# Patient Record
Sex: Female | Born: 1965
Health system: Southern US, Community
[De-identification: ages and names within clinical notes are randomized; demographics above are authoritative.]

## PROBLEM LIST (undated history)

## (undated) DIAGNOSIS — M1990 Unspecified osteoarthritis, unspecified site: Secondary | ICD-10-CM

## (undated) DIAGNOSIS — E785 Hyperlipidemia, unspecified: Secondary | ICD-10-CM

## (undated) DIAGNOSIS — M48061 Spinal stenosis, lumbar region without neurogenic claudication: Secondary | ICD-10-CM

## (undated) DIAGNOSIS — F419 Anxiety disorder, unspecified: Secondary | ICD-10-CM

## (undated) DIAGNOSIS — F32A Depression, unspecified: Secondary | ICD-10-CM

## (undated) DIAGNOSIS — F329 Major depressive disorder, single episode, unspecified: Secondary | ICD-10-CM

## (undated) DIAGNOSIS — G4730 Sleep apnea, unspecified: Secondary | ICD-10-CM

## (undated) HISTORY — DX: Anxiety disorder, unspecified: F41.9

## (undated) HISTORY — PX: BREAST BIOPSY: SHX20

## (undated) HISTORY — PX: WRIST SURGERY: SHX841

## (undated) HISTORY — PX: BACK SURGERY: SHX140

## (undated) HISTORY — DX: Depression, unspecified: F32.A

## (undated) HISTORY — DX: Major depressive disorder, single episode, unspecified: F32.9

## (undated) HISTORY — DX: Hyperlipidemia, unspecified: E78.5

---

## 1988-12-16 HISTORY — PX: BREAST BIOPSY: SHX20

## 1994-04-17 HISTORY — PX: LAMINECTOMY: SHX219

## 1998-04-17 HISTORY — PX: CHOLECYSTECTOMY: SHX55

## 2002-10-01 ENCOUNTER — Encounter: Admission: RE | Admit: 2002-10-01 | Discharge: 2002-10-01 | Payer: Self-pay | Admitting: Orthopedic Surgery

## 2002-10-01 ENCOUNTER — Encounter: Payer: Self-pay | Admitting: Radiology

## 2002-10-01 ENCOUNTER — Encounter: Payer: Self-pay | Admitting: Orthopedic Surgery

## 2002-10-15 ENCOUNTER — Encounter: Admission: RE | Admit: 2002-10-15 | Discharge: 2002-10-15 | Payer: Self-pay | Admitting: Orthopedic Surgery

## 2002-10-15 ENCOUNTER — Encounter: Payer: Self-pay | Admitting: Orthopedic Surgery

## 2003-01-07 ENCOUNTER — Encounter: Admission: RE | Admit: 2003-01-07 | Discharge: 2003-01-07 | Payer: Self-pay | Admitting: Orthopedic Surgery

## 2003-01-07 ENCOUNTER — Encounter: Payer: Self-pay | Admitting: Orthopedic Surgery

## 2005-06-23 DIAGNOSIS — Z720 Tobacco use: Secondary | ICD-10-CM | POA: Insufficient documentation

## 2006-11-27 DIAGNOSIS — N926 Irregular menstruation, unspecified: Secondary | ICD-10-CM | POA: Insufficient documentation

## 2012-01-17 HISTORY — PX: ANKLE SURGERY: SHX546

## 2012-01-18 DIAGNOSIS — E669 Obesity, unspecified: Secondary | ICD-10-CM | POA: Insufficient documentation

## 2012-10-04 ENCOUNTER — Ambulatory Visit: Payer: Self-pay | Admitting: Urology

## 2012-10-14 ENCOUNTER — Ambulatory Visit: Payer: Self-pay | Admitting: Family Medicine

## 2012-10-14 LAB — HM HEPATITIS C SCREENING LAB: HM Hepatitis Screen: NEGATIVE

## 2012-10-29 ENCOUNTER — Ambulatory Visit: Payer: Self-pay | Admitting: Urology

## 2012-11-04 ENCOUNTER — Ambulatory Visit: Payer: Self-pay | Admitting: Urology

## 2013-10-14 LAB — LIPID PANEL
Cholesterol: 172 mg/dL (ref 0–200)
HDL: 46 mg/dL (ref 35–70)
LDL CALC: 99 mg/dL
Triglycerides: 134 mg/dL (ref 40–160)

## 2013-10-14 LAB — CBC AND DIFFERENTIAL
HEMATOCRIT: 46 % (ref 36–46)
Hemoglobin: 15.8 g/dL (ref 12.0–16.0)
Neutrophils Absolute: 67 /uL
Platelets: 296 10*3/uL (ref 150–399)
WBC: 9.6 10*3/mL

## 2013-10-14 LAB — HEMOGLOBIN A1C: Hemoglobin A1C: 5.6

## 2013-10-14 LAB — HEPATIC FUNCTION PANEL
ALT: 7 U/L (ref 7–35)
AST: 11 U/L — AB (ref 13–35)
Alkaline Phosphatase: 103 U/L (ref 25–125)
Bilirubin, Total: 0.3 mg/dL

## 2013-10-14 LAB — BASIC METABOLIC PANEL
BUN: 9 mg/dL (ref 4–21)
Creatinine: 0.6 mg/dL (ref 0.5–1.1)
GLUCOSE: 115 mg/dL
POTASSIUM: 5.4 mmol/L — AB (ref 3.4–5.3)
Sodium: 141 mmol/L (ref 137–147)

## 2014-01-20 DIAGNOSIS — R053 Chronic cough: Secondary | ICD-10-CM | POA: Insufficient documentation

## 2014-01-20 DIAGNOSIS — R05 Cough: Secondary | ICD-10-CM | POA: Insufficient documentation

## 2014-04-15 ENCOUNTER — Emergency Department: Payer: Self-pay | Admitting: Emergency Medicine

## 2014-04-16 ENCOUNTER — Ambulatory Visit: Payer: Self-pay | Admitting: General Practice

## 2014-04-20 ENCOUNTER — Ambulatory Visit: Payer: Self-pay | Admitting: General Practice

## 2014-08-07 NOTE — Op Note (Signed)
PATIENT NAME:  Emily Bender, Emily Bender MR#:  [id number] DATE OF BIRTH:  1966-03-15  DATE OF PROCEDURE:  11/04/2012  PREOPERATIVE DIAGNOSIS: Interstitial cystitis.  POSTOPERATIVE DIAGNOSIS:  Interstitial cystitis.   PROCEDURE:  Cysto hydro-dilatation of the bladder for interstitial cystitis.   FINDINGS: Left lateral ureter, seated position. Normal right ureter. With the patient sterilely prepped and draped in supine lithotomy position for ease of approach to the external genitalia, the procedure was begun. The patient has good relaxation from the general anesthetic. Cystoscopy was done with a 21 French sheath and a 4-oblique lens. The bladder was examined. Left lateral ureter seen in the seated position with horseshoe-like orifice. The right ureter is normal in position with a stadium-like orifice. The bladder shows some trabeculation, about grade I. I think it looks more like a neurogenic bladder rather than an interstitial cystitis bladder. The hydro-dilatation is done. There is no terminal hematuria, petechial hemorrhage to  glomerulation seen. I think the patient has more of an overactive neurogenic type bladder. No evidence of infection, tumor mass or growth is seen. Urine is coming from each ureteral orifice. At the end of the procedure, the bladder is emptied. The capacities were 600, 650 to 700 and  500 mL of water solution. After the bladder is emptied, 30 mL of Marcaine placed in, she was sent to recovery in satisfactory condition with B and O suppository. Bimanual exam is negative.   ____________________________ Janice Coffin. Elnoria Howard, [place] rdh:cc D: 11/04/2012 14:04:44 ET T: 11/04/2012 17:53:02 ET JOB#: [id number]  cc: Janice Coffin. Elnoria Howard, DO, <Dictator> RICHARD D HART DO ELECTRONICALLY SIGNED 11/08/2012 16:39

## 2014-08-16 NOTE — Op Note (Signed)
PATIENT NAME:  Emily Bender, Emily Bender MR#:  [id number] DATE OF BIRTH:  1965/05/23  DATE OF PROCEDURE:  04/20/2014  PREOPERATIVE DIAGNOSIS: Displaced comminuted extra-articular right distal radius fracture.   POSTOPERATIVE DIAGNOSIS: Displaced comminuted extra-articular right distal radius fracture.   PROCEDURE PERFORMED: Open reduction and internal fixation of right distal radius fracture.   SURGEON: Skip Estimable, MD   ANESTHESIA: General.   ESTIMATED BLOOD LOSS: Minimal.   FLUIDS REPLACED: 1100 mL of crystalloid.   TOURNIQUET TIME: 75 minutes.   IMPLANTS UTILIZED: [employer] DVRA-S volar plate, seven 2.5 mm fully threaded pegs, and three 3.5 mm cortical screws.   INDICATIONS FOR SURGERY: The patient is a 49 year old female who fell on her outstretched right hand and sustained comminuted displaced extra-articular distal radius fracture. Risks and benefits of surgical intervention were discussed with the patient. She expressed understanding of the risks and benefits and agreed with plans for surgical intervention.   PROCEDURE IN DETAIL: The patient was brought to the operating room and, after adequate general anesthesia was achieved, a tourniquet was placed on the patient's upper right arm. The patient's right hand and arm were cleaned and prepped with alcohol and DuraPrep and draped in the usual sterile fashion. A "timeout" was performed as per usual protocol. The right upper extremity was exsanguinated using an Esmarch, and the tourniquet was inflated to 250 mmHg. Loupe magnification was used throughout the procedure. A longitudinal incision was made on the volar aspect of the wrist and forearm in line with the flexor carpi radialis tendon. Dissection was carried down and the tendon sheath was incised and the tendon was reflected in an ulnar direction. Floor of the tendon sheath was subsequently incised and dissection was carried down so as to develop a plane extending down to the pronator  quadratus. The pronator was incised in a L-shaped incision and elevated off of the volar surface of the radius. The fracture site was visualized and soft tissue debris and hematoma was evacuated from the site. The fracture was reduced with good position noted in both AP and lateral planes using the FluoroScan. A DVRA-S volar plate was then provisionally placed on the volar surface of the radius and provisional fixation maintained with K wires. Again, good reduction and position of the hardware was appreciated. A 3.5 mm cortical screw was inserted through the slotted space of the plate. Next, four 2.5 mm fully threaded pegs were inserted through the proximal row. Good position was noted. The 3 distal positions were then utilized for placement of an additional three 2.5 mm fully threaded pegs. Good position and reduction was appreciated in both AP and lateral planes. The proximal portion of the plate was then further secured using 2 additional 3.5 mm cortical screws. Again, good reduction and position was noted in both AP, lateral, and PA planes using the FluoroScan. The wound was irrigated with copious amounts of normal saline with antibiotic solution. The pronator quadratus was placed over the plate and repaired using #0 Vicryl. The tourniquet was deflated after total tourniquet time of 75 minutes. Good hemostasis was appreciated. The wound was then closed using #2-0 Vicryl. A running subcuticular suture of #4-0 Vicryl was used for closure of the skin. Steri-Strips were applied. Then, 10 mL of 0.25% Marcaine was injected along the incision site. A sterile dressing was applied followed by application of a volar wrist splint.   The patient tolerated the procedure well. She was transported to the recovery room in stable condition.  ____________________________ Laurice Record. [place]  Brooke Bonito., MD jph:sb D: 04/21/2014 06:31:12 ET T: 04/21/2014 08:13:35 ET JOB#: [id number]  cc: Laurice Record. Holley Bouche., MD, <Dictator> JAMES P  Holley Bouche MD ELECTRONICALLY SIGNED 05/04/2014 0:49

## 2014-10-14 ENCOUNTER — Other Ambulatory Visit: Payer: Self-pay | Admitting: Orthopedic Surgery

## 2014-10-14 DIAGNOSIS — M25562 Pain in left knee: Secondary | ICD-10-CM

## 2014-10-15 ENCOUNTER — Ambulatory Visit: Admission: RE | Admit: 2014-10-15 | Payer: BLUE CROSS/BLUE SHIELD | Source: Ambulatory Visit

## 2014-10-21 ENCOUNTER — Ambulatory Visit: Payer: Self-pay

## 2015-02-04 ENCOUNTER — Other Ambulatory Visit: Payer: Self-pay | Admitting: Family Medicine

## 2015-04-08 ENCOUNTER — Other Ambulatory Visit: Payer: Self-pay | Admitting: Family Medicine

## 2015-04-08 DIAGNOSIS — G629 Polyneuropathy, unspecified: Secondary | ICD-10-CM

## 2015-04-08 NOTE — Telephone Encounter (Signed)
Emily Bender.  

## 2015-05-05 ENCOUNTER — Other Ambulatory Visit: Payer: Self-pay | Admitting: Physician Assistant

## 2015-05-20 ENCOUNTER — Ambulatory Visit (INDEPENDENT_AMBULATORY_CARE_PROVIDER_SITE_OTHER): Payer: BLUE CROSS/BLUE SHIELD | Admitting: Family Medicine

## 2015-05-20 ENCOUNTER — Encounter: Payer: Self-pay | Admitting: Family Medicine

## 2015-05-20 VITALS — BP 142/82 | HR 94 | Temp 98.2°F | Resp 18 | Wt 244.2 lb

## 2015-05-20 DIAGNOSIS — J069 Acute upper respiratory infection, unspecified: Secondary | ICD-10-CM | POA: Diagnosis not present

## 2015-05-20 DIAGNOSIS — Z7289 Other problems related to lifestyle: Secondary | ICD-10-CM | POA: Insufficient documentation

## 2015-05-20 DIAGNOSIS — H109 Unspecified conjunctivitis: Secondary | ICD-10-CM

## 2015-05-20 DIAGNOSIS — F32A Depression, unspecified: Secondary | ICD-10-CM | POA: Insufficient documentation

## 2015-05-20 DIAGNOSIS — E559 Vitamin D deficiency, unspecified: Secondary | ICD-10-CM | POA: Insufficient documentation

## 2015-05-20 DIAGNOSIS — F329 Major depressive disorder, single episode, unspecified: Secondary | ICD-10-CM | POA: Insufficient documentation

## 2015-05-20 DIAGNOSIS — E785 Hyperlipidemia, unspecified: Secondary | ICD-10-CM | POA: Insufficient documentation

## 2015-05-20 DIAGNOSIS — F419 Anxiety disorder, unspecified: Secondary | ICD-10-CM | POA: Insufficient documentation

## 2015-05-20 DIAGNOSIS — Z9660 Presence of unspecified orthopedic joint implant: Secondary | ICD-10-CM | POA: Insufficient documentation

## 2015-05-20 DIAGNOSIS — Z789 Other specified health status: Secondary | ICD-10-CM | POA: Insufficient documentation

## 2015-05-20 MED ORDER — AZITHROMYCIN 250 MG PO TABS: ORAL_TABLET | ORAL | Status: DC

## 2015-05-20 MED ORDER — PROMETHAZINE-DM 6.25-15 MG/5ML PO SYRP: 5.0000 mL | ORAL_SOLUTION | Freq: Four times a day (QID) | ORAL | Status: DC | PRN

## 2015-05-20 MED ORDER — ERYTHROMYCIN 5 MG/GM OP OINT: 1.0000 "application " | TOPICAL_OINTMENT | Freq: Three times a day (TID) | OPHTHALMIC | Status: DC

## 2015-05-20 MED ORDER — ALBUTEROL SULFATE HFA 108 (90 BASE) MCG/ACT IN AERS: 2.0000 | INHALATION_SPRAY | Freq: Four times a day (QID) | RESPIRATORY_TRACT | Status: DC | PRN

## 2015-05-20 NOTE — Progress Notes (Signed)
Patient ID: Emily Bender, female   DOB: 06-Jun-1965, 50 y.o.   MRN: [id number]   Patient: Emily Bender Female    DOB: Feb 24, 1966   50 y.o.   MRN: [id number] Visit Date: 05/20/2015  Today's Provider: Vernie Murders, PA   Chief Complaint  Patient presents with  . URI   Subjective:    URI  This is a new problem. The current episode started in the past 7 days. The problem has been gradually worsening. Associated symptoms include congestion, coughing, ear pain and a sore throat. Treatments tried: OTC Alka-seltzer. The treatment provided mild relief.   Patient Active Problem List   Diagnosis Date Noted  . Alcohol drinker ([place]) 05/20/2015  . Anxiety and depression 05/20/2015  . History of artificial joint 05/20/2015  . HLD (hyperlipidemia) 05/20/2015  . Avitaminosis D 05/20/2015  . Chronic cough 01/20/2014  . Adiposity 01/18/2012  . Irregular bleeding 11/27/2006  . Tobacco use 06/23/2005   Past Surgical History  Procedure Laterality Date  . Cholecystectomy  2000  . Laminectomy  1996  . Ankle surgery  01/17/2012  . Breast biopsy Left 1990's   Family History  Problem Relation Age of Onset  . Diabetes Mother   . Hypertension Mother   . Lung cancer Father   . Healthy Brother   . Aneurysm Maternal Grandmother   . Lung cancer Maternal Grandfather   . Coronary artery disease Paternal Grandmother      Previous Medications   ALBUTEROL (PROVENTIL HFA) 108 (90 BASE) MCG/ACT INHALER    Inhale into the lungs. Reported on 05/20/2015   CALCIUM-MAGNESIUM-VITAMIN D (CALCIUM MAGNESIUM PO)    Take by mouth. Reported on 05/20/2015   CHOLECALCIFEROL (VITAMIN D) 1000 UNITS TABLET    Take by mouth. Reported on 05/20/2015   CYANOCOBALAMIN 100 MCG TABLET    Take by mouth. Reported on 05/20/2015   DIAZEPAM (VALIUM) 5 MG TABLET    Take by mouth.   FLUTICASONE (FLONASE) 50 MCG/ACT NASAL SPRAY    Place into the nose. Reported on 05/20/2015   GABAPENTIN (NEURONTIN) 100 MG CAPSULE    TAKE 1 CAPSULE BY MOUTH  TWICE DAILY   LORATADINE (CLARITIN) 10 MG TABLET    Take by mouth. Reported on 05/20/2015   MELOXICAM (MOBIC) 7.5 MG TABLET       MONTELUKAST (SINGULAIR) 10 MG TABLET    Take by mouth. Reported on 05/20/2015   SERTRALINE (ZOLOFT) 50 MG TABLET    TAKE ONE TABLET BY MOUTH AT BEDTIME   Allergies  Allergen Reactions  . Sulfa Antibiotics     Review of Systems  Constitutional: Negative.   HENT: Positive for congestion, ear pain and sore throat.   Eyes: Negative.   Respiratory: Positive for cough.   Cardiovascular: Negative.   Gastrointestinal: Negative.   Endocrine: Negative.   Genitourinary: Negative.   Musculoskeletal: Negative.   Skin: Negative.   Allergic/Immunologic: Negative.   Neurological: Negative.   Hematological: Negative.   Psychiatric/Behavioral: Negative.     Social History  Substance Use Topics  . Smoking status: Current Every Day Smoker -- 1.00 packs/day for 20 years    Types: Cigarettes  . Smokeless tobacco: Never Used  . Alcohol Use: 0.0 oz/week    0 Standard drinks or equivalent per week     Comment: moderate use- drinks 4-6 beers a day   Objective:   BP 142/82 mmHg  Pulse 94  Temp(Src) 98.2 F (36.8 C) (Oral)  Resp 18  Wt 244 lb 3.2 oz (  110.768 kg)  SpO2 94%  Physical Exam  Constitutional: She is oriented to person, place, and time. She appears well-developed and well-nourished.  HENT:  Head: Normocephalic.  Questionable milky fluid behind left TM. Turbinates slightly swollen and red. Slightly irritated and cobblestone appearing posterior pharynx.  Eyes: EOM are normal.  Slightly hyperemic left conjuntiva  Neck: Neck supple.  Cardiovascular: Normal rate and regular rhythm.   Pulmonary/Chest: Effort normal and breath sounds normal. She has no rales.  Abdominal: Soft. Bowel sounds are normal.  Lymphadenopathy:    She has cervical adenopathy.  Neurological: She is alert and oriented to person, place, and time.  Skin: No rash noted.  Psychiatric: She  has a normal mood and affect. Her behavior is normal.      Assessment & Plan:     1. Upper respiratory infection Onset over the past 2-3 days with purulent sputum with cough at night. Some chest tightness at times. Not much help with Alka-Seltzer Cold. Still smoking 1/2-1 ppd before this started (unable to smoke now without worsening cough). Will refill Albuterol MDK and add cough syrup with antibiotic. Increase fluid intake and may use Tylenol or Advil prn. Recheck prn. - albuterol (PROAIR HFA) 108 (90 Base) MCG/ACT inhaler; Inhale 2 puffs into the lungs every 6 (six) hours as needed for wheezing or shortness of breath. Reported on 05/20/2015  Dispense: 1 Inhaler; Refill: 3 - azithromycin (ZITHROMAX) 250 MG tablet; Take two tablets today by mouth then one daily for 4 days.  Dispense: 6 tablet; Refill: 0 - promethazine-dextromethorphan (PROMETHAZINE-DM) 6.25-15 MG/5ML syrup; Take 5 mLs by mouth 4 (four) times daily as needed for cough.  Dispense: 118 mL; Refill: 0  2. Conjunctivitis of left eye Noticed crusting and milky mucus from the left eye this morning. Red conjunctiva. Will treat with antibiotic ointment and warm compresses. Recheck if no better in 3-4 days. - erythromycin ophthalmic ointment; Place 1 application into the left eye 3 (three) times daily. Use 1/2" ribbon of ointment inside of the lower eyelid.  Dispense: 3.5 g; Refill: 0

## 2015-06-09 ENCOUNTER — Other Ambulatory Visit: Payer: Self-pay | Admitting: Physician Assistant

## 2015-06-15 ENCOUNTER — Other Ambulatory Visit: Payer: Self-pay | Admitting: Family Medicine

## 2015-10-11 DIAGNOSIS — Z96661 Presence of right artificial ankle joint: Secondary | ICD-10-CM | POA: Diagnosis not present

## 2015-10-11 DIAGNOSIS — Z471 Aftercare following joint replacement surgery: Secondary | ICD-10-CM | POA: Diagnosis not present

## 2015-10-14 ENCOUNTER — Other Ambulatory Visit: Payer: Self-pay | Admitting: Family Medicine

## 2016-02-21 DIAGNOSIS — L728 Other follicular cysts of the skin and subcutaneous tissue: Secondary | ICD-10-CM | POA: Diagnosis not present

## 2016-02-28 ENCOUNTER — Encounter: Payer: Self-pay | Admitting: Family Medicine

## 2016-02-28 ENCOUNTER — Ambulatory Visit (INDEPENDENT_AMBULATORY_CARE_PROVIDER_SITE_OTHER): Payer: BLUE CROSS/BLUE SHIELD | Admitting: Family Medicine

## 2016-02-28 VITALS — BP 148/98 | HR 91 | Temp 98.2°F | Resp 16 | Ht 67.0 in | Wt 231.4 lb

## 2016-02-28 DIAGNOSIS — E782 Mixed hyperlipidemia: Secondary | ICD-10-CM | POA: Diagnosis not present

## 2016-02-28 DIAGNOSIS — F329 Major depressive disorder, single episode, unspecified: Secondary | ICD-10-CM

## 2016-02-28 DIAGNOSIS — F418 Other specified anxiety disorders: Secondary | ICD-10-CM | POA: Diagnosis not present

## 2016-02-28 DIAGNOSIS — R030 Elevated blood-pressure reading, without diagnosis of hypertension: Secondary | ICD-10-CM | POA: Diagnosis not present

## 2016-02-28 DIAGNOSIS — Z0000 Encounter for general adult medical examination without abnormal findings: Secondary | ICD-10-CM | POA: Diagnosis not present

## 2016-02-28 DIAGNOSIS — E559 Vitamin D deficiency, unspecified: Secondary | ICD-10-CM

## 2016-02-28 DIAGNOSIS — E6609 Other obesity due to excess calories: Secondary | ICD-10-CM | POA: Diagnosis not present

## 2016-02-28 DIAGNOSIS — Z9660 Presence of unspecified orthopedic joint implant: Secondary | ICD-10-CM | POA: Diagnosis not present

## 2016-02-28 DIAGNOSIS — Z6836 Body mass index (BMI) 36.0-36.9, adult: Secondary | ICD-10-CM

## 2016-02-28 DIAGNOSIS — L728 Other follicular cysts of the skin and subcutaneous tissue: Secondary | ICD-10-CM | POA: Diagnosis not present

## 2016-02-28 DIAGNOSIS — F32A Depression, unspecified: Secondary | ICD-10-CM

## 2016-02-28 DIAGNOSIS — Z124 Encounter for screening for malignant neoplasm of cervix: Secondary | ICD-10-CM

## 2016-02-28 DIAGNOSIS — F419 Anxiety disorder, unspecified: Secondary | ICD-10-CM

## 2016-02-28 NOTE — Progress Notes (Signed)
Patient: Emily Bender, Female    DOB: 1965/10/04, 50 y.o.   MRN: [id number] Visit Date: 02/28/2016  Today's Provider: Vernie Murders, PA   Chief Complaint  Patient presents with  . Annual Exam   Subjective:    Annual physical exam Emily Bender is a 50 y.o. female who presents today for health maintenance and complete physical. She feels fairly well. She reports exercising a little bit. She reports she is sleeping poorly.  ----------------------------------------------------------------- Mammo: 10/14/2012 Pap: due today  Review of Systems  Constitutional: Negative.   HENT: Negative.   Eyes: Negative.   Respiratory: Negative.   Cardiovascular: Negative.   Gastrointestinal: Negative.   Endocrine: Negative.   Genitourinary: Negative.   Musculoskeletal: Positive for arthralgias, back pain and joint swelling.  Skin: Negative.   Allergic/Immunologic: Negative.   Neurological: Negative.   Hematological: Negative.   Psychiatric/Behavioral: Negative.     Social History      She  reports that she has been smoking Cigarettes.  She has a 20.00 pack-year smoking history. She has never used smokeless tobacco. She reports that she drinks alcohol. She reports that she uses drugs.       Social History   Social History  . Marital status: Single    Spouse name: N/A  . Number of children: N/A  . Years of education: N/A   Social History Main Topics  . Smoking status: Current Every Day Smoker    Packs/day: 1.00    Years: 20.00    Types: Cigarettes  . Smokeless tobacco: Never Used  . Alcohol use 0.0 oz/week     Comment: moderate use- drinks 4-6 beers a day  . Drug use:      Comment: uses recreational drugs  . Sexual activity: Not Asked   Other Topics Concern  . None   Social History Narrative  . None    No past medical history on file.   Patient Active Problem List   Diagnosis Date Noted  . Alcohol drinker 05/20/2015  . Anxiety and depression 05/20/2015    . History of artificial joint 05/20/2015  . HLD (hyperlipidemia) 05/20/2015  . Avitaminosis D 05/20/2015  . Chronic cough 01/20/2014  . Adiposity 01/18/2012  . Irregular bleeding 11/27/2006  . Tobacco use 06/23/2005    Past Surgical History:  Procedure Laterality Date  . ANKLE SURGERY  01/17/2012  . BREAST BIOPSY Left 1990's  . CHOLECYSTECTOMY  2000  . LAMINECTOMY  1996    Family History        Family Status  Relation Status  . Father Deceased at age 9  . Brother Alive  . Maternal Grandmother Deceased  . Maternal Grandfather Deceased  . Paternal Grandmother Deceased        Her family history includes Aneurysm in her maternal grandmother; Coronary artery disease in her paternal grandmother; Diabetes in her mother; Healthy in her brother; Hypertension in her mother; Lung cancer in her father and maternal grandfather.     Allergies  Allergen Reactions  . Sulfa Antibiotics Hives     Current Outpatient Prescriptions:  .  diazepam (VALIUM) 5 MG tablet, Take by mouth., Disp: , Rfl:  .  gabapentin (NEURONTIN) 100 MG capsule, TAKE 1 CAPSULE TWICE DAILY, Disp: 60 capsule, Rfl: 0 .  meloxicam (MOBIC) 7.5 MG tablet, , Disp: , Rfl: 0 .  sertraline (ZOLOFT) 50 MG tablet, TAKE ONE TABLET AT BEDTIME, Disp: 30 tablet, Rfl: 3   Patient Care Team: Vickki Muff  Jerrod Damiano, PA as PCP - General (Physician Assistant)      Objective:   Vitals: BP (!) 148/98 (BP Location: Right Arm, Patient Position: Sitting, Cuff Size: Large)   Pulse 91   Temp 98.2 F (36.8 C) (Oral)   Resp 16   Ht 5\' 7"  (1.702 m)   Wt 231 lb 6.4 oz (105 kg)   SpO2 95%   BMI 36.24 kg/m   Wt Readings from Last 3 Encounters:  02/28/16 231 lb 6.4 oz (105 kg)  05/20/15 244 lb 3.2 oz (110.8 kg)    Physical Exam  Constitutional: She is oriented to person, place, and time. She appears well-developed and well-nourished.  HENT:  Head: Normocephalic and atraumatic.  Right Ear: External ear normal.  Left Ear: External  ear normal.  Nose: Nose normal.  Mouth/Throat: Oropharynx is clear and moist.  Eyes: Conjunctivae and EOM are normal. Pupils are equal, round, and reactive to light. Right eye exhibits no discharge.  Neck: Normal range of motion. Neck supple. No tracheal deviation present. No thyromegaly present.  Cardiovascular: Normal rate, regular rhythm, normal heart sounds and intact distal pulses.   No murmur heard. Pulmonary/Chest: Effort normal and breath sounds normal. No respiratory distress. She has no wheezes. She has no rales. She exhibits no tenderness.  Abdominal: Soft. Bowel sounds are normal. She exhibits no distension and no mass. There is no tenderness. There is no rebound and no guarding.  Genitourinary: Vagina normal and uterus normal. Rectal exam shows guaiac negative stool.  Musculoskeletal: She exhibits no edema or tenderness.  Slight crepitus both knees. Right ankle larger than the left with history of total ankle arthroplasty in 2013 and fracture off the top of the right ankle navicular in June 2017. Good pulses bilaterally. Stiffness of the right ankle. Well healed scar from lumbar laminectomy in 1992 and 1996. Good spinal ROM without numbness in legs.  Lymphadenopathy:    She has no cervical adenopathy.  Neurological: She is alert and oriented to person, place, and time. She has normal reflexes. No cranial nerve deficit. She exhibits normal muscle tone. Coordination normal.  Skin: Skin is warm and dry. No rash noted. No erythema.  Psychiatric: Her speech is normal and behavior is normal. Judgment and thought content normal. Her affect is blunt.   Depression Screen PHQ 2/9 Scores 02/28/2016  PHQ - 2 Score 1  PHQ- 9 Score 4   Assessment & Plan:     Routine Health Maintenance and Physical Exam  Exercise Activities and Dietary recommendations Goals    Exercises by walking the dog and working in her yard.      Immunization History  Administered Date(s) Administered  . Tdap  11/22/2010    Health Maintenance  Topic Date Due  . HIV Screening  09/28/1981  . PAP SMEAR  09/29/1987  . INFLUENZA VACCINE  11/16/2015  . TETANUS/TDAP  11/21/2020     Discussed health benefits of physical activity, and encouraged her to engage in regular exercise appropriate for her age and condition.    -------------------------------------------------------------------- 1. Annual physical exam General health stable. Obtained Pap smear today. Needs mammograms. Decline immunizations.  2. Mixed hyperlipidemia Past history of elevated lipids. Last recheck in 2015 was essentially back to normal. Still drinking 5-6 beers a day. Recommend restricting intake and getting back on low fat diet to lose weight. Recheck labs. - CBC with Differential/Platelet - Comprehensive metabolic panel - Lipid panel - TSH  3. History of artificial joint History of total right ankle  arthroplasty in 2013. No specific injury. States it was recommended after her podiatrist noticed joint degeneration. Has had additional surgeries and was found to have a right ankle navicular fracture in June 2017. Awaiting schedule for more orthopedic surgery at [facility] by Dr. Debby Bud.  4. Avitaminosis D No longer taking Vitamin-D supplements. Needs BMD but unwilling to get it done now. - VITAMIN D 25 Hydroxy (Vit-D Deficiency, Fractures)  5. Anxiety and depression Feels the Sertraline 50 mg qd is helping to control anxiety with depressive reactions. No suicidal ideation. Will check labs and may need to increase to Sertraline to 100 mg qd. Follow up pending reports. - TSH - T4  6. Class 2 obesity due to excess calories without serious comorbidity with body mass index (BMI) of 36.0 to 36.9 in adult Has lost 13 lbs since February 2017. Has tried a friend's Phentermine. BP elevated. Will check labs for metabolic disorder and should stop the Phentermine. Need to work on lowering caloric intake to 1500 daily and exercise 3-4 days a  week. Recheck pending lab reports. - CBC with Differential/Platelet - Comprehensive metabolic panel - Hemoglobin A1c - Lipid panel - TSH - T4  7. Elevated BP without diagnosis of hypertension Not on any BP medications. Will recommend salt/sodium restriction, limit ETOH to 1-2 beers a day, restrict caffeine and check routine labs. Follow up pending reports. Stop using friend's Phentermine as it can cause elevation of BP. - CBC with Differential/Platelet - Comprehensive metabolic panel - TSH - T4  8. Cervical cancer screening Normal exam with stool negative for blood. Specimen obtained for Pap smear. - Pap IG, rfx HPV all pth    Vernie Murders, PA  [facility]

## 2016-02-28 NOTE — Patient Instructions (Signed)
Calorie Counting for Weight Loss Calories are energy you get from the things you eat and drink. Your body uses this energy to keep you going throughout the day. The number of calories you eat affects your weight. When you eat more calories than your body needs, your body stores the extra calories as fat. When you eat fewer calories than your body needs, your body burns fat to get the energy it needs. Calorie counting means keeping track of how many calories you eat and drink each day. If you make sure to eat fewer calories than your body needs, you should lose weight. In order for calorie counting to work, you will need to eat the number of calories that are right for you in a day to lose a healthy amount of weight per week. A healthy amount of weight to lose per week is usually 1-2 lb (0.5-0.9 kg). A dietitian can determine how many calories you need in a day and give you suggestions on how to reach your calorie goal.  WHAT IS MY MY PLAN? My goal is to have ___1600_______ calories per day.  If I have this many calories per day, I should lose around __1-2 Exercising to Lose Weight Exercising can help you to lose weight. In order to lose weight through exercise, you need to do vigorous-intensity exercise. You can tell that you are exercising with vigorous intensity if you are breathing very hard and fast and cannot hold a conversation while exercising. Moderate-intensity exercise helps to maintain your current weight. You can tell that you are exercising at a moderate level if you have a higher heart rate and faster breathing, but you are still able to hold a conversation. HOW OFTEN SHOULD I EXERCISE? Choose an activity that you enjoy and set realistic goals. Your health care provider can help you to make an activity plan that works for you. Exercise regularly as directed by your health care provider. This may include: Doing resistance training twice each week, such as: Push-ups. Sit-ups. Lifting  weights. Using resistance bands. Doing a given intensity of exercise for a given amount of time. Choose from these options: 150 minutes of moderate-intensity exercise every week. 75 minutes of vigorous-intensity exercise every week. A mix of moderate-intensity and vigorous-intensity exercise every week. Children, pregnant women, people who are out of shape, people who are overweight, and older adults may need to consult a health care provider for individual recommendations. If you have any sort of medical condition, be sure to consult your health care provider before starting a new exercise program. WHAT ARE SOME ACTIVITIES THAT CAN HELP ME TO LOSE WEIGHT?  Walking at a rate of at least 4.5 miles an hour. Jogging or running at a rate of 5 miles per hour. Biking at a rate of at least 10 miles per hour. Lap swimming. Roller-skating or in-line skating. Cross-country skiing. Vigorous competitive sports, such as football, basketball, and soccer. Jumping rope. Aerobic dancing. HOW CAN I BE MORE ACTIVE IN MY DAY-TO-DAY ACTIVITIES? Use the stairs instead of the elevator. Take a walk during your lunch break. If you drive, park your car farther away from work or school. If you take public transportation, get off one stop early and walk the rest of the way. Make all of your phone calls while standing up and walking around. Get up, stretch, and walk around every 30 minutes throughout the day. WHAT GUIDELINES SHOULD I FOLLOW WHILE EXERCISING? Do not exercise so much that you hurt yourself, feel dizzy,  or get very short of breath. Consult your health care provider prior to starting a new exercise program. Wear comfortable clothes and shoes with good support. Drink plenty of water while you exercise to prevent dehydration or heat stroke. Body water is lost during exercise and must be replaced. Work out until you breathe faster and your heart beats faster.   This information is not intended to replace  advice given to you by your health care provider. Make sure you discuss any questions you have with your health care provider.   Document Released: 05/06/2010 Document Revised: 04/24/2014 Document Reviewed: 09/04/2013 Elsevier Interactive Patient Education [employer]. ________ pounds per week. WHAT DO I NEED TO KNOW ABOUT CALORIE COUNTING? In order to meet your daily calorie goal, you will need to:  Find out how many calories are in each food you would like to eat. Try to do this before you eat.  Decide how much of the food you can eat.  Write down what you ate and how many calories it had. Doing this is called keeping a food log. WHERE DO I FIND CALORIE INFORMATION? The number of calories in a food can be found on a Nutrition Facts label. Note that all the information on a label is based on a specific serving of the food. If a food does not have a Nutrition Facts label, try to look up the calories online or ask your dietitian for help. HOW DO I DECIDE HOW MUCH TO EAT? To decide how much of the food you can eat, you will need to consider both the number of calories in one serving and the size of one serving. This information can be found on the Nutrition Facts label. If a food does not have a Nutrition Facts label, look up the information online or ask your dietitian for help. Remember that calories are listed per serving. If you choose to have more than one serving of a food, you will have to multiply the calories per serving by the amount of servings you plan to eat. For example, the label on a package of bread might say that a serving size is 1 slice and that there are 90 calories in a serving. If you eat 1 slice, you will have eaten 90 calories. If you eat 2 slices, you will have eaten 180 calories. HOW DO I KEEP A FOOD LOG? After each meal, record the following information in your food log:  What you ate.  How much of it you ate.  How many calories it had.  Then, add up your  calories. Keep your food log near you, such as in a small notebook in your pocket. Another option is to use a mobile app or website. Some programs will calculate calories for you and show you how many calories you have left each time you add an item to the log. WHAT ARE SOME CALORIE COUNTING TIPS?  Use your calories on foods and drinks that will fill you up and not leave you hungry. Some examples of this include foods like nuts and nut butters, vegetables, lean proteins, and high-fiber foods (more than 5 g fiber per serving).  Eat nutritious foods and avoid empty calories. Empty calories are calories you get from foods or beverages that do not have many nutrients, such as candy and soda. It is better to have a nutritious high-calorie food (such as an avocado) than a food with few nutrients (such as a bag of chips).  Know how many  calories are in the foods you eat most often. This way, you do not have to look up how many calories they have each time you eat them.  Look out for foods that may seem like low-calorie foods but are really high-calorie foods, such as baked goods, soda, and fat-free candy.  Pay attention to calories in drinks. Drinks such as sodas, specialty coffee drinks, alcohol, and juices have a lot of calories yet do not fill you up. Choose low-calorie drinks like water and diet drinks.  Focus your calorie counting efforts on higher calorie items. Logging the calories in a garden salad that contains only vegetables is less important than calculating the calories in a milk shake.  Find a way of tracking calories that works for you. Get creative. Most people who are successful find ways to keep track of how much they eat in a day, even if they do not count every calorie. WHAT ARE SOME PORTION CONTROL TIPS?  Know how many calories are in a serving. This will help you know how many servings of a certain food you can have.  Use a measuring cup to measure serving sizes. This is helpful  when you start out. With time, you will be able to estimate serving sizes for some foods.  Take some time to put servings of different foods on your favorite plates, bowls, and cups so you know what a serving looks like.  Try not to eat straight from a bag or box. Doing this can lead to overeating. Put the amount you would like to eat in a cup or on a plate to make sure you are eating the right portion.  Use smaller plates, glasses, and bowls to prevent overeating. This is a quick and easy way to practice portion control. If your plate is smaller, less food can fit on it.  Try not to multitask while eating, such as watching TV or using your computer. If it is time to eat, sit down at a table and enjoy your food. Doing this will help you to start recognizing when you are full. It will also make you more aware of what and how much you are eating. HOW CAN I CALORIE COUNT WHEN EATING OUT?  Ask for smaller portion sizes or child-sized portions.  Consider sharing an entree and sides instead of getting your own entree.  If you get your own entree, eat only half. Ask for a box at the beginning of your meal and put the rest of your entree in it so you are not tempted to eat it.  Look for the calories on the menu. If calories are listed, choose the lower calorie options.  Choose dishes that include vegetables, fruits, whole grains, low-fat dairy products, and lean protein. Focusing on smart food choices from each of the 5 food groups can help you stay on track at restaurants.  Choose items that are boiled, broiled, grilled, or steamed.  Choose water, milk, unsweetened iced tea, or other drinks without added sugars. If you want an alcoholic beverage, choose a lower calorie option. For example, a regular margarita can have up to 700 calories and a glass of wine has around 150.  Stay away from items that are buttered, battered, fried, or served with cream sauce. Items labeled "crispy" are usually fried,  unless stated otherwise.  Ask for dressings, sauces, and syrups on the side. These are usually very high in calories, so do not eat much of them.  Watch out for salads.  Many people think salads are a healthy option, but this is often not the case. Many salads come with bacon, fried chicken, lots of cheese, fried chips, and dressing. All of these items have a lot of calories. If you want a salad, choose a garden salad and ask for grilled meats or steak. Ask for the dressing on the side, or ask for olive oil and vinegar or lemon to use as dressing.  Estimate how many servings of a food you are given. For example, a serving of cooked rice is  cup or about the size of half a tennis ball or one cupcake wrapper. Knowing serving sizes will help you be aware of how much food you are eating at restaurants. The list below tells you how big or small some common portion sizes are based on everyday objects.  1 oz--4 stacked dice.  3 oz--1 deck of cards.  1 tsp--1 dice.  1 Tbsp-- a Ping-Pong ball.  2 Tbsp--1 Ping-Pong ball.   cup--1 tennis ball or 1 cupcake wrapper.  1 cup--1 baseball.   This information is not intended to replace advice given to you by your health care provider. Make sure you discuss any questions you have with your health care provider.   Document Released: 04/03/2005 Document Revised: 04/24/2014 Document Reviewed: 02/06/2013 Elsevier Interactive Patient Education [employer].

## 2016-03-01 LAB — PAP IG, RFX HPV ALL PTH: PAP SMEAR COMMENT: 0

## 2016-03-03 ENCOUNTER — Telehealth: Payer: Self-pay

## 2016-03-03 NOTE — Telephone Encounter (Signed)
Patient advised.

## 2016-03-03 NOTE — Telephone Encounter (Signed)
-----   Message from Margo Common, [place] sent at 03/03/2016  2:00 PM EST ----- Normal pap smear. No sign of abnormal cells.

## 2016-03-13 DIAGNOSIS — F418 Other specified anxiety disorders: Secondary | ICD-10-CM | POA: Diagnosis not present

## 2016-03-13 DIAGNOSIS — Z6836 Body mass index (BMI) 36.0-36.9, adult: Secondary | ICD-10-CM | POA: Diagnosis not present

## 2016-03-13 DIAGNOSIS — E782 Mixed hyperlipidemia: Secondary | ICD-10-CM | POA: Diagnosis not present

## 2016-03-13 DIAGNOSIS — E6609 Other obesity due to excess calories: Secondary | ICD-10-CM | POA: Diagnosis not present

## 2016-03-13 DIAGNOSIS — E559 Vitamin D deficiency, unspecified: Secondary | ICD-10-CM | POA: Diagnosis not present

## 2016-03-14 ENCOUNTER — Telehealth: Payer: Self-pay

## 2016-03-14 ENCOUNTER — Other Ambulatory Visit: Payer: Self-pay | Admitting: Family Medicine

## 2016-03-14 LAB — LIPID PANEL
CHOL/HDL RATIO: 3.6 ratio (ref 0.0–4.4)
Cholesterol, Total: 206 mg/dL — ABNORMAL HIGH (ref 100–199)
HDL: 58 mg/dL (ref >39)
LDL CALC: 110 mg/dL — AB (ref 0–99)
TRIGLYCERIDES: 191 mg/dL — AB (ref 0–149)
VLDL Cholesterol Cal: 38 mg/dL (ref 5–40)

## 2016-03-14 LAB — CBC WITH DIFFERENTIAL/PLATELET
BASOS ABS: 0.1 10*3/uL (ref 0.0–0.2)
Basos: 1 %
EOS (ABSOLUTE): 0.2 10*3/uL (ref 0.0–0.4)
Eos: 2 %
Hematocrit: 49.5 % — ABNORMAL HIGH (ref 34.0–46.6)
Hemoglobin: 17.2 g/dL — ABNORMAL HIGH (ref 11.1–15.9)
IMMATURE GRANULOCYTES: 0 %
Immature Grans (Abs): 0 10*3/uL (ref 0.0–0.1)
LYMPHS ABS: 2.4 10*3/uL (ref 0.7–3.1)
Lymphs: 31 %
MCH: 32 pg (ref 26.6–33.0)
MCHC: 34.7 g/dL (ref 31.5–35.7)
MCV: 92 fL (ref 79–97)
MONOS ABS: 0.6 10*3/uL (ref 0.1–0.9)
Monocytes: 8 %
NEUTROS PCT: 58 %
Neutrophils Absolute: 4.5 10*3/uL (ref 1.4–7.0)
PLATELETS: 254 10*3/uL (ref 150–379)
RBC: 5.37 x10E6/uL — AB (ref 3.77–5.28)
RDW: 13 % (ref 12.3–15.4)
WBC: 7.6 10*3/uL (ref 3.4–10.8)

## 2016-03-14 LAB — COMPREHENSIVE METABOLIC PANEL
ALBUMIN: 4.6 g/dL (ref 3.5–5.5)
ALK PHOS: 79 IU/L (ref 39–117)
ALT: 12 IU/L (ref 0–32)
AST: 13 IU/L (ref 0–40)
Albumin/Globulin Ratio: 1.9 (ref 1.2–2.2)
BILIRUBIN TOTAL: 0.4 mg/dL (ref 0.0–1.2)
BUN / CREAT RATIO: 23 (ref 9–23)
BUN: 15 mg/dL (ref 6–24)
CHLORIDE: 99 mmol/L (ref 96–106)
CO2: 24 mmol/L (ref 18–29)
Calcium: 9.6 mg/dL (ref 8.7–10.2)
Creatinine, Ser: 0.66 mg/dL (ref 0.57–1.00)
GFR calc non Af Amer: 104 mL/min/{1.73_m2} (ref >59)
GFR, EST AFRICAN AMERICAN: 120 mL/min/{1.73_m2} (ref >59)
GLOBULIN, TOTAL: 2.4 g/dL (ref 1.5–4.5)
GLUCOSE: 141 mg/dL — AB (ref 65–99)
Potassium: 4.9 mmol/L (ref 3.5–5.2)
SODIUM: 139 mmol/L (ref 134–144)
TOTAL PROTEIN: 7 g/dL (ref 6.0–8.5)

## 2016-03-14 LAB — HEMOGLOBIN A1C
Est. average glucose Bld gHb Est-mCnc: 108 mg/dL
HEMOGLOBIN A1C: 5.4 % (ref 4.8–5.6)

## 2016-03-14 LAB — TSH: TSH: 2.24 u[IU]/mL (ref 0.450–4.500)

## 2016-03-14 LAB — T4: T4, Total: 5.7 ug/dL (ref 4.5–12.0)

## 2016-03-14 LAB — VITAMIN D 25 HYDROXY (VIT D DEFICIENCY, FRACTURES): VIT D 25 HYDROXY: 17.5 ng/mL — AB (ref 30.0–100.0)

## 2016-03-14 MED ORDER — SIMVASTATIN 20 MG PO TABS: 20.0000 mg | ORAL_TABLET | Freq: Every day | ORAL | 3 refills | Status: DC

## 2016-03-14 NOTE — Telephone Encounter (Signed)
-----   Message from [employer], [place] sent at 03/14/2016  8:01 AM EST ----- Hgb and Hct high - probably due to the amount of smoking. Encourage to stop, or at least, decrease. Cholesterol and triglycerides high. Blood glucose is elevated but Hgb A1C is normal. Recommend Simvastatin 20 mg qd #30 & 3 RF and low fat diet with alcohol restriction (only 1-2 beers/day). Recheck appointment in 3 months to assess progress.

## 2016-03-14 NOTE — Telephone Encounter (Signed)
Pt advised. Medication sent to [facility] and FU scheduled. Renaldo Fiddler, CMA

## 2016-03-20 DIAGNOSIS — Z6835 Body mass index (BMI) 35.0-35.9, adult: Secondary | ICD-10-CM | POA: Diagnosis not present

## 2016-03-20 DIAGNOSIS — M79605 Pain in left leg: Secondary | ICD-10-CM | POA: Diagnosis not present

## 2016-03-20 DIAGNOSIS — M5416 Radiculopathy, lumbar region: Secondary | ICD-10-CM | POA: Diagnosis not present

## 2016-03-24 ENCOUNTER — Other Ambulatory Visit: Payer: Self-pay | Admitting: Family Medicine

## 2016-03-24 NOTE — Telephone Encounter (Signed)
RX called in at [facility] pharmacy  

## 2016-03-24 NOTE — Telephone Encounter (Signed)
Call in refill of the Diazepam as authorized in chart. Thanks!

## 2016-03-30 DIAGNOSIS — M5416 Radiculopathy, lumbar region: Secondary | ICD-10-CM | POA: Diagnosis not present

## 2016-03-30 DIAGNOSIS — M47817 Spondylosis without myelopathy or radiculopathy, lumbosacral region: Secondary | ICD-10-CM | POA: Diagnosis not present

## 2016-04-07 DIAGNOSIS — M5416 Radiculopathy, lumbar region: Secondary | ICD-10-CM | POA: Diagnosis not present

## 2016-04-27 DIAGNOSIS — Z6836 Body mass index (BMI) 36.0-36.9, adult: Secondary | ICD-10-CM | POA: Diagnosis not present

## 2016-04-27 DIAGNOSIS — M5416 Radiculopathy, lumbar region: Secondary | ICD-10-CM | POA: Diagnosis not present

## 2016-05-08 DIAGNOSIS — M5416 Radiculopathy, lumbar region: Secondary | ICD-10-CM | POA: Diagnosis not present

## 2016-06-06 ENCOUNTER — Ambulatory Visit (INDEPENDENT_AMBULATORY_CARE_PROVIDER_SITE_OTHER): Payer: BLUE CROSS/BLUE SHIELD | Admitting: Family Medicine

## 2016-06-06 ENCOUNTER — Encounter: Payer: Self-pay | Admitting: Family Medicine

## 2016-06-06 VITALS — BP 148/96 | HR 81 | Temp 98.2°F | Resp 18 | Wt 240.2 lb

## 2016-06-06 DIAGNOSIS — F32A Depression, unspecified: Secondary | ICD-10-CM

## 2016-06-06 DIAGNOSIS — E782 Mixed hyperlipidemia: Secondary | ICD-10-CM | POA: Diagnosis not present

## 2016-06-06 DIAGNOSIS — F418 Other specified anxiety disorders: Secondary | ICD-10-CM | POA: Diagnosis not present

## 2016-06-06 DIAGNOSIS — Z720 Tobacco use: Secondary | ICD-10-CM

## 2016-06-06 DIAGNOSIS — F329 Major depressive disorder, single episode, unspecified: Secondary | ICD-10-CM

## 2016-06-06 DIAGNOSIS — F419 Anxiety disorder, unspecified: Secondary | ICD-10-CM

## 2016-06-06 DIAGNOSIS — R030 Elevated blood-pressure reading, without diagnosis of hypertension: Secondary | ICD-10-CM

## 2016-06-06 NOTE — Patient Instructions (Signed)
Steps to Quit Smoking Smoking tobacco can be harmful to your health and can affect almost every organ in your body. Smoking puts you, and those around you, at risk for developing many serious chronic diseases. Quitting smoking is difficult, but it is one of the best things that you can do for your health. It is never too late to quit. What are the benefits of quitting smoking? When you quit smoking, you lower your risk of developing serious diseases and conditions, such as:  Lung cancer or lung disease, such as COPD.  Heart disease.  Stroke.  Heart attack.  Infertility.  Osteoporosis and bone fractures.  Additionally, symptoms such as coughing, wheezing, and shortness of breath may get better when you quit. You may also find that you get sick less often because your body is stronger at fighting off colds and infections. If you are pregnant, quitting smoking can help to reduce your chances of having a baby of low birth weight. How do I get ready to quit? When you decide to quit smoking, create a plan to make sure that you are successful. Before you quit:  Pick a date to quit. Set a date within the next two weeks to give you time to prepare.  Write down the reasons why you are quitting. Keep this list in places where you will see it often, such as on your bathroom mirror or in your car or wallet.  Identify the people, places, things, and activities that make you want to smoke (triggers) and avoid them. Make sure to take these actions: ? Throw away all cigarettes at home, at work, and in your car. ? Throw away smoking accessories, such as ashtrays and lighters. ? Clean your car and make sure to empty the ashtray. ? Clean your home, including curtains and carpets.  Tell your family, friends, and coworkers that you are quitting. Support from your loved ones can make quitting easier.  Talk with your health care provider about your options for quitting smoking.  Find out what treatment  options are covered by your health insurance.  What strategies can I use to quit smoking? Talk with your healthcare provider about different strategies to quit smoking. Some strategies include:  Quitting smoking altogether instead of gradually lessening how much you smoke over a period of time. Research shows that quitting "cold turkey" is more successful than gradually quitting.  Attending in-person counseling to help you build problem-solving skills. You are more likely to have success in quitting if you attend several counseling sessions. Even short sessions of 10 minutes can be effective.  Finding resources and support systems that can help you to quit smoking and remain smoke-free after you quit. These resources are most helpful when you use them often. They can include: ? Online chats with a counselor. ? Telephone quitlines. ? Printed self-help materials. ? Support groups or group counseling. ? Text messaging programs. ? Mobile phone applications.  Taking medicines to help you quit smoking. (If you are pregnant or breastfeeding, talk with your health care provider first.) Some medicines contain nicotine and some do not. Both types of medicines help with cravings, but the medicines that include nicotine help to relieve withdrawal symptoms. Your health care provider may recommend: ? Nicotine patches, gum, or lozenges. ? Nicotine inhalers or sprays. ? Non-nicotine medicine that is taken by mouth.  Talk with your health care provider about combining strategies, such as taking medicines while you are also receiving in-person counseling. Using these two strategies together   makes you more likely to succeed in quitting than if you used either strategy on its own. If you are pregnant or breastfeeding, talk with your health care provider about finding counseling or other support strategies to quit smoking. Do not take medicine to help you quit smoking unless told to do so by your health care  provider. What things can I do to make it easier to quit? Quitting smoking might feel overwhelming at first, but there is a lot that you can do to make it easier. Take these important actions:  Reach out to your family and friends and ask that they support and encourage you during this time. Call telephone quitlines, reach out to support groups, or work with a counselor for support.  Ask people who smoke to avoid smoking around you.  Avoid places that trigger you to smoke, such as bars, parties, or smoke-break areas at work.  Spend time around people who do not smoke.  Lessen stress in your life, because stress can be a smoking trigger for some people. To lessen stress, try: ? Exercising regularly. ? Deep-breathing exercises. ? Yoga. ? Meditating. ? Performing a body scan. This involves closing your eyes, scanning your body from head to toe, and noticing which parts of your body are particularly tense. Purposefully relax the muscles in those areas.  Download or purchase mobile phone or tablet apps (applications) that can help you stick to your quit plan by providing reminders, tips, and encouragement. There are many free apps, such as QuitGuide from the CDC (Centers for Disease Control and Prevention). You can find other support for quitting smoking (smoking cessation) through smokefree.gov and other websites.  How will I feel when I quit smoking? Within the first 24 hours of quitting smoking, you may start to feel some withdrawal symptoms. These symptoms are usually most noticeable 2-3 days after quitting, but they usually do not last beyond 2-3 weeks. Changes or symptoms that you might experience include:  Mood swings.  Restlessness, anxiety, or irritation.  Difficulty concentrating.  Dizziness.  Strong cravings for sugary foods in addition to nicotine.  Mild weight gain.  Constipation.  Nausea.  Coughing or a sore throat.  Changes in how your medicines work in your  body.  A depressed mood.  Difficulty sleeping (insomnia).  After the first 2-3 weeks of quitting, you may start to notice more positive results, such as:  Improved sense of smell and taste.  Decreased coughing and sore throat.  Slower heart rate.  Lower blood pressure.  Clearer skin.  The ability to breathe more easily.  Fewer sick days.  Quitting smoking is very challenging for most people. Do not get discouraged if you are not successful the first time. Some people need to make many attempts to quit before they achieve long-term success. Do your best to stick to your quit plan, and talk with your health care provider if you have any questions or concerns. This information is not intended to replace advice given to you by your health care provider. Make sure you discuss any questions you have with your health care provider. Document Released: 03/28/2001 Document Revised: 11/30/2015 Document Reviewed: 08/18/2014 Elsevier Interactive Patient Education  2017 [employer]  

## 2016-06-06 NOTE — Progress Notes (Signed)
Patient: Emily Bender Female    DOB: 05/08/1965   51 y.o.   MRN: [id number] Visit Date: 06/06/2016  Today's Provider: Vernie Murders, PA   Chief Complaint  Patient presents with  . Anxiety  . Depression  . Hyperlipidemia  . Follow-up   Subjective:    HPI  Lipid/Cholesterol, Follow-up:   Last seen for this 3 months ago.  Management since that visit includes recommended a low fat diet and restrict alcohol intake. Patient started Simvastatin 20 mg on 11/28 due to elevated labs.  Last Lipid Panel:    Component Value Date/Time   CHOL 206 (H) 03/13/2016 0846   TRIG 191 (H) 03/13/2016 0846   HDL 58 03/13/2016 0846   CHOLHDL 3.6 03/13/2016 0846   LDLCALC 110 (H) 03/13/2016 0846    She reports fair compliance with treatment. Patient denies following a low fat diet. She is taking Simvastatin and has reduce alcohol intake slightly.  She is not having side effects.   Wt Readings from Last 3 Encounters:  06/06/16 240 lb 3.2 oz (109 kg)  02/28/16 231 lb 6.4 oz (105 kg)  05/20/15 244 lb 3.2 oz (110.8 kg)    ------------------------------------------------------------------------ Anxiety & Depression:  3 month follow up. Continue Sertraline which helps to control patient's anxiety with depressive reactions. Symptoms are stable.   Patient Active Problem List   Diagnosis Date Noted  . Alcohol drinker 05/20/2015  . Anxiety and depression 05/20/2015  . History of artificial joint 05/20/2015  . HLD (hyperlipidemia) 05/20/2015  . Avitaminosis D 05/20/2015  . Chronic cough 01/20/2014  . Adiposity 01/18/2012  . Irregular bleeding 11/27/2006  . Tobacco use 06/23/2005   Past Surgical History:  Procedure Laterality Date  . ANKLE SURGERY  01/17/2012  . BREAST BIOPSY Left 1990's  . CHOLECYSTECTOMY  2000  . LAMINECTOMY  1996   Family History  Problem Relation Age of Onset  . Diabetes Mother   . Hypertension Mother   . Lung cancer Father   . Healthy Brother   . Aneurysm  Maternal Grandmother   . Lung cancer Maternal Grandfather   . Coronary artery disease Paternal Grandmother    Allergies  Allergen Reactions  . Sulfa Antibiotics Hives     Previous Medications   DIAZEPAM (VALIUM) 5 MG TABLET    TAKE ONE TABLET BY MOUTH TWICE DAILY AS NEEDED FOR ANXIETY OR AGITATION   GABAPENTIN (NEURONTIN) 300 MG CAPSULE       MELOXICAM (MOBIC) 7.5 MG TABLET       SERTRALINE (ZOLOFT) 50 MG TABLET    TAKE ONE TABLET AT BEDTIME   SIMVASTATIN (ZOCOR) 20 MG TABLET    Take 1 tablet (20 mg total) by mouth at bedtime.    Review of Systems  Constitutional: Negative.   Respiratory: Negative.   Cardiovascular: Negative.   Musculoskeletal: Negative.   Psychiatric/Behavioral: Positive for dysphoric mood. The patient is nervous/anxious.     Social History  Substance Use Topics  . Smoking status: Current Every Day Smoker    Packs/day: 1.00    Years: 20.00    Types: Cigarettes  . Smokeless tobacco: Never Used  . Alcohol use 0.0 oz/week     Comment: moderate use- drinks 4-6 beers a day   Objective:   BP (!) 148/96 (BP Location: Right Arm, Patient Position: Sitting, Cuff Size: Normal)   Pulse 81   Temp 98.2 F (36.8 C) (Oral)   Resp 18   Wt 240 lb 3.2 oz (109 kg)  SpO2 94%   BMI 37.62 kg/m   Physical Exam  Constitutional: She is oriented to person, place, and time. She appears well-developed and well-nourished. No distress.  HENT:  Head: Normocephalic and atraumatic.  Right Ear: Hearing and external ear normal.  Left Ear: Hearing and external ear normal.  Nose: Nose normal.  Mouth/Throat: Oropharynx is clear and moist.  Eyes: Conjunctivae and lids are normal. Right eye exhibits no discharge. Left eye exhibits no discharge. No scleral icterus.  Neck: Neck supple.  Cardiovascular: Normal rate and regular rhythm.   Pulmonary/Chest: Effort normal and breath sounds normal. No respiratory distress.  Abdominal: Soft. Bowel sounds are normal.  Lymphadenopathy:     She has no cervical adenopathy.  Neurological: She is alert and oriented to person, place, and time.  Skin: Skin is intact. No lesion and no rash noted.  Psychiatric: She has a normal mood and affect. Her speech is normal and behavior is normal. Thought content normal.      Assessment & Plan:     1. Mixed hyperlipidemia Tolerating Simvastatin without side effects. Denies chest pain or palpitations. Recheck labs and continue present dosage. - CBC with Differential/Platelet - Comprehensive metabolic panel - Lipid panel  2. Anxiety and depression Feeling stable with good sleep pattern on the Sertraline 50 mg HS. Recheck labs and follow up pending reports. Continue present Sertraline dosage. - Comprehensive metabolic panel  3. Elevated BP without diagnosis of hypertension Elevated today. States when evaluated at [facility] for lumbar radiculopathy and right ankle pain from joint replacement, BP has been stable and normal. Recommend limiting salt intake, restrict ETOH and lose weight. Declines any BP meds at the present. Recheck labs. - CBC with Differential/Platelet  4. Tobacco use Still smoking 1/2-1 ppd. States she can't tolerate Chantix because of mood swings. Counseled regarding Bupropion, Nicotine patches, hypnosis, etc. Will contact us if she needs help.

## 2016-06-07 DIAGNOSIS — E782 Mixed hyperlipidemia: Secondary | ICD-10-CM | POA: Diagnosis not present

## 2016-06-07 DIAGNOSIS — R030 Elevated blood-pressure reading, without diagnosis of hypertension: Secondary | ICD-10-CM | POA: Diagnosis not present

## 2016-06-07 DIAGNOSIS — F418 Other specified anxiety disorders: Secondary | ICD-10-CM | POA: Diagnosis not present

## 2016-06-08 LAB — CBC WITH DIFFERENTIAL/PLATELET
BASOS ABS: 0.1 10*3/uL (ref 0.0–0.2)
BASOS: 1 %
EOS (ABSOLUTE): 0.2 10*3/uL (ref 0.0–0.4)
Eos: 2 %
Hematocrit: 40.6 % (ref 34.0–46.6)
Hemoglobin: 13.8 g/dL (ref 11.1–15.9)
Immature Grans (Abs): 0 10*3/uL (ref 0.0–0.1)
Immature Granulocytes: 0 %
Lymphocytes Absolute: 2.7 10*3/uL (ref 0.7–3.1)
Lymphs: 29 %
MCH: 31 pg (ref 26.6–33.0)
MCHC: 34 g/dL (ref 31.5–35.7)
MCV: 91 fL (ref 79–97)
MONOS ABS: 0.5 10*3/uL (ref 0.1–0.9)
Monocytes: 5 %
NEUTROS ABS: 5.7 10*3/uL (ref 1.4–7.0)
NEUTROS PCT: 63 %
PLATELETS: 291 10*3/uL (ref 150–379)
RBC: 4.45 x10E6/uL (ref 3.77–5.28)
RDW: 13 % (ref 12.3–15.4)
WBC: 9.1 10*3/uL (ref 3.4–10.8)

## 2016-06-08 LAB — COMPREHENSIVE METABOLIC PANEL
ALK PHOS: 71 IU/L (ref 39–117)
ALT: 9 IU/L (ref 0–32)
AST: 11 IU/L (ref 0–40)
Albumin/Globulin Ratio: 2.1 (ref 1.2–2.2)
Albumin: 4.4 g/dL (ref 3.5–5.5)
BILIRUBIN TOTAL: 0.4 mg/dL (ref 0.0–1.2)
BUN/Creatinine Ratio: 24 — ABNORMAL HIGH (ref 9–23)
BUN: 14 mg/dL (ref 6–24)
CHLORIDE: 101 mmol/L (ref 96–106)
CO2: 22 mmol/L (ref 18–29)
Calcium: 9.2 mg/dL (ref 8.7–10.2)
Creatinine, Ser: 0.58 mg/dL (ref 0.57–1.00)
GFR calc Af Amer: 125 (ref >59)
GFR calc non Af Amer: 109 (ref >59)
GLUCOSE: 104 mg/dL — AB (ref 65–99)
Globulin, Total: 2.1 (ref 1.5–4.5)
POTASSIUM: 4.6 mmol/L (ref 3.5–5.2)
Sodium: 140 mmol/L (ref 134–144)
Total Protein: 6.5 g/dL (ref 6.0–8.5)

## 2016-06-08 LAB — LIPID PANEL
CHOLESTEROL TOTAL: 152 mg/dL (ref 100–199)
Chol/HDL Ratio: 2.3 (ref 0.0–4.4)
HDL: 65 mg/dL (ref >39)
LDL Calculated: 66 (ref 0–99)
TRIGLYCERIDES: 106 mg/dL (ref 0–149)
VLDL Cholesterol Cal: 21 (ref 5–40)

## 2016-06-16 ENCOUNTER — Other Ambulatory Visit: Payer: Self-pay | Admitting: Family Medicine

## 2016-12-07 DIAGNOSIS — M5416 Radiculopathy, lumbar region: Secondary | ICD-10-CM | POA: Diagnosis not present

## 2016-12-11 ENCOUNTER — Other Ambulatory Visit: Payer: Self-pay | Admitting: Family Medicine

## 2016-12-11 DIAGNOSIS — F419 Anxiety disorder, unspecified: Secondary | ICD-10-CM

## 2016-12-11 DIAGNOSIS — F329 Major depressive disorder, single episode, unspecified: Secondary | ICD-10-CM

## 2016-12-11 DIAGNOSIS — F32A Depression, unspecified: Secondary | ICD-10-CM

## 2016-12-11 MED ORDER — SERTRALINE HCL 50 MG PO TABS: ORAL_TABLET | ORAL | 6 refills | Status: DC

## 2016-12-26 ENCOUNTER — Other Ambulatory Visit: Payer: Self-pay | Admitting: Family Medicine

## 2016-12-26 DIAGNOSIS — M5416 Radiculopathy, lumbar region: Secondary | ICD-10-CM

## 2016-12-26 MED ORDER — MELOXICAM 7.5 MG PO TABS: 7.5000 mg | ORAL_TABLET | Freq: Two times a day (BID) | ORAL | 3 refills | Status: DC

## 2016-12-26 MED ORDER — GABAPENTIN 300 MG PO CAPS: 300.0000 mg | ORAL_CAPSULE | Freq: Three times a day (TID) | ORAL | 3 refills | Status: DC

## 2016-12-26 NOTE — Telephone Encounter (Signed)
Pt contacted office for refill request on the following medications:  gabapentin (NEURONTIN) 300 MG capsule \  meloxicam (MOBIC) 7.5 MG tablet   [facility].  CB#[phone]/MW

## 2017-02-01 DIAGNOSIS — M533 Sacrococcygeal disorders, not elsewhere classified: Secondary | ICD-10-CM | POA: Diagnosis not present

## 2017-02-01 DIAGNOSIS — M545 Low back pain: Secondary | ICD-10-CM | POA: Diagnosis not present

## 2017-02-15 ENCOUNTER — Other Ambulatory Visit: Payer: Self-pay | Admitting: Family Medicine

## 2017-04-19 ENCOUNTER — Other Ambulatory Visit: Payer: Self-pay | Admitting: Family Medicine

## 2017-05-05 ENCOUNTER — Other Ambulatory Visit: Payer: Self-pay | Admitting: Family Medicine

## 2017-05-05 DIAGNOSIS — M5416 Radiculopathy, lumbar region: Secondary | ICD-10-CM

## 2017-05-24 ENCOUNTER — Other Ambulatory Visit: Payer: Self-pay | Admitting: Family Medicine

## 2017-06-01 ENCOUNTER — Other Ambulatory Visit: Payer: Self-pay | Admitting: Family Medicine

## 2017-06-01 DIAGNOSIS — M5416 Radiculopathy, lumbar region: Secondary | ICD-10-CM

## 2017-06-27 DIAGNOSIS — S92351A Displaced fracture of fifth metatarsal bone, right foot, initial encounter for closed fracture: Secondary | ICD-10-CM | POA: Diagnosis not present

## 2017-06-27 DIAGNOSIS — M87071 Idiopathic aseptic necrosis of right ankle: Secondary | ICD-10-CM | POA: Diagnosis not present

## 2017-06-27 DIAGNOSIS — S92354A Nondisplaced fracture of fifth metatarsal bone, right foot, initial encounter for closed fracture: Secondary | ICD-10-CM | POA: Diagnosis not present

## 2017-06-27 DIAGNOSIS — M79671 Pain in right foot: Secondary | ICD-10-CM | POA: Diagnosis not present

## 2017-06-27 DIAGNOSIS — Y33XXXA Other specified events, undetermined intent, initial encounter: Secondary | ICD-10-CM | POA: Diagnosis not present

## 2017-07-03 ENCOUNTER — Other Ambulatory Visit: Payer: Self-pay | Admitting: Family Medicine

## 2017-09-01 ENCOUNTER — Other Ambulatory Visit: Payer: Self-pay | Admitting: Family Medicine

## 2017-09-01 DIAGNOSIS — M5416 Radiculopathy, lumbar region: Secondary | ICD-10-CM

## 2017-11-01 ENCOUNTER — Other Ambulatory Visit: Payer: Self-pay | Admitting: Family Medicine

## 2017-11-01 DIAGNOSIS — M5416 Radiculopathy, lumbar region: Secondary | ICD-10-CM

## 2017-12-28 ENCOUNTER — Other Ambulatory Visit: Payer: Self-pay | Admitting: Family Medicine

## 2018-01-28 ENCOUNTER — Other Ambulatory Visit: Payer: Self-pay | Admitting: Family Medicine

## 2018-01-31 ENCOUNTER — Other Ambulatory Visit: Payer: Self-pay

## 2018-01-31 ENCOUNTER — Encounter: Payer: Self-pay | Admitting: Family Medicine

## 2018-01-31 ENCOUNTER — Ambulatory Visit: Payer: BLUE CROSS/BLUE SHIELD | Admitting: Family Medicine

## 2018-01-31 VITALS — BP 140/80 | HR 96 | Temp 98.0°F | Ht 67.0 in | Wt 219.4 lb

## 2018-01-31 DIAGNOSIS — F419 Anxiety disorder, unspecified: Secondary | ICD-10-CM

## 2018-01-31 DIAGNOSIS — M5416 Radiculopathy, lumbar region: Secondary | ICD-10-CM

## 2018-01-31 DIAGNOSIS — Z789 Other specified health status: Secondary | ICD-10-CM

## 2018-01-31 DIAGNOSIS — F329 Major depressive disorder, single episode, unspecified: Secondary | ICD-10-CM | POA: Diagnosis not present

## 2018-01-31 DIAGNOSIS — Z7289 Other problems related to lifestyle: Secondary | ICD-10-CM

## 2018-01-31 DIAGNOSIS — F32A Depression, unspecified: Secondary | ICD-10-CM

## 2018-01-31 MED ORDER — MELOXICAM 7.5 MG PO TABS: 7.5000 mg | ORAL_TABLET | Freq: Two times a day (BID) | ORAL | 6 refills | Status: DC

## 2018-01-31 MED ORDER — SERTRALINE HCL 100 MG PO TABS: ORAL_TABLET | ORAL | 6 refills | Status: DC

## 2018-01-31 NOTE — Progress Notes (Signed)
Patient: Emily Bender Female    DOB: October 06, 1965   52 y.o.   MRN: [id number] Visit Date: 01/31/2018  Today's Provider: Vernie Murders, PA   Chief Complaint  Patient presents with  . Medication Refill   Subjective:    HPI  Pt reports she is here for medication refill on Zoloft and possibly valium.  Pt states that she is completely out of her medications for about 2 weeks now. States she is controlling alcohol better (4 beers a day). Sleeping 7-9 hours a night and eating 3 meals a day. Has lost 20 lbs in the past year.     Past Medical History:  Diagnosis Date  . Anxiety   . Depression   . Hyperlipidemia    Past Surgical History:  Procedure Laterality Date  . ANKLE SURGERY  01/17/2012  . BREAST BIOPSY Left 1990's  . CHOLECYSTECTOMY  2000  . LAMINECTOMY  1996   Family History  Problem Relation Age of Onset  . Lung cancer Father   . Healthy Brother   . Aneurysm Maternal Grandmother   . Lung cancer Maternal Grandfather   . Coronary artery disease Paternal Grandmother   . Diabetes Mother   . Hypertension Mother    Allergies  Allergen Reactions  . Sulfa Antibiotics Hives    Current Outpatient Medications:  .  diazepam (VALIUM) 5 MG tablet, TAKE ONE TABLET BY MOUTH TWICE DAILY AS NEEDED FOR ANXIETY OR AGITATION, Disp: 30 tablet, Rfl: 0 .  gabapentin (NEURONTIN) 300 MG capsule, TAKE 1 CAPSULE 3 TIMES DAILY, Disp: 90 capsule, Rfl: 0 .  meloxicam (MOBIC) 7.5 MG tablet, TAKE ONE TABLET BY MOUTH TWICE DAILY, Disp: 60 tablet, Rfl: 3 .  sertraline (ZOLOFT) 50 MG tablet, TAKE ONE TABLET BY MOUTH EVERY DAY AT BEDTIME, Disp: 30 tablet, Rfl: 0 .  simvastatin (ZOCOR) 20 MG tablet, TAKE ONE TABLET AT BEDTIME (Patient not taking: Reported on 01/31/2018), Disp: 30 tablet, Rfl: 0  Review of Systems  Constitutional: Negative.   HENT: Negative.   Eyes: Negative.   Respiratory: Negative.   Cardiovascular: Negative.   Gastrointestinal: Negative.   Endocrine: Negative.     Genitourinary: Negative.   Musculoskeletal: Negative.   Skin: Negative.   Allergic/Immunologic: Negative.   Neurological: Negative.   Hematological: Negative.   Psychiatric/Behavioral: Negative.    Social History   Tobacco Use  . Smoking status: Current Every Day Smoker    Packs/day: 1.00    Years: 20.00    Pack years: 20.00    Types: Cigarettes  . Smokeless tobacco: Never Used  Substance Use Topics  . Alcohol use: Yes    Alcohol/week: 0.0 standard drinks    Comment: moderate use- drinks 4-6 beers a day   Objective:   BP 140/80 (BP Location: Right Arm, Patient Position: Sitting, Cuff Size: Normal)   Pulse 96   Temp 98 F (36.7 C) (Oral)   Ht 5\' 7"  (1.702 m)   Wt 219 lb 6.4 oz (99.5 kg)   SpO2 96%   BMI 34.36 kg/m  Vitals:   01/31/18 1024  BP: 140/80  Pulse: 96  Temp: 98 F (36.7 C)  TempSrc: Oral  SpO2: 96%  Weight: 219 lb 6.4 oz (99.5 kg)  Height: 5\' 7"  (1.702 m)   Physical Exam  Constitutional: She is oriented to person, place, and time. She appears well-developed and well-nourished. No distress.  HENT:  Head: Normocephalic and atraumatic.  Right Ear: Hearing normal.  Left Ear: Hearing  normal.  Nose: Nose normal.  Eyes: Conjunctivae and lids are normal. Right eye exhibits no discharge. Left eye exhibits no discharge. No scleral icterus.  Pulmonary/Chest: Effort normal. No respiratory distress.  Musculoskeletal: Normal range of motion.  Neurological: She is alert and oriented to person, place, and time.  Skin: Skin is intact. No lesion and no rash noted.  Psychiatric: She has a normal mood and affect. Her speech is normal and behavior is normal. Thought content normal.      Assessment & Plan:     1. Anxiety and depression Has run out of her Zoloft 2 weeks a go. Finds anxiety, depression and irritability worsening. Spontaneous crying and anger when discussing chronic back/knee pains and medical bills. Will increase Zoloft to 100 mg qd. Advised she should  maintain routine follow up her every 6 months and schedule follow up labs. Wants to check on insurance coverage before getting blood drawn. - sertraline (ZOLOFT) 100 MG tablet; TAKE ONE TABLET BY MOUTH EVERY DAY AT BEDTIME  Dispense: 30 tablet; Refill: 6  2. Alcohol drinker Better control of consumption (down to 4 beers a day). No difficulty maintaining her job. Encouraged to work on quitting.  3. Lumbar radiculopathy Has had multiple cortisone spinal injections at [facility] and [facility]. Some arthritic crepitus and pains in both knees. Finds the Mobic continues to control back pain and arthritis in knees. Refilled prescription and advised to follow up every 6 months or as needed. - meloxicam (MOBIC) 7.5 MG tablet; Take 1 tablet (7.5 mg total) by mouth 2 (two) times daily.  Dispense: 60 tablet; Refill: [place], [place]  [facility]

## 2018-02-05 ENCOUNTER — Other Ambulatory Visit: Payer: Self-pay | Admitting: Family Medicine

## 2018-02-05 DIAGNOSIS — M5416 Radiculopathy, lumbar region: Secondary | ICD-10-CM

## 2018-03-20 ENCOUNTER — Telehealth: Payer: Self-pay | Admitting: Family Medicine

## 2018-03-20 NOTE — Telephone Encounter (Signed)
Patient was suppose to get refills on Valium during her last OV but it was never sent into [facility].  She is completely out and needs this please.

## 2018-03-21 ENCOUNTER — Other Ambulatory Visit: Payer: Self-pay

## 2018-03-21 ENCOUNTER — Other Ambulatory Visit: Payer: Self-pay | Admitting: Family Medicine

## 2018-03-21 DIAGNOSIS — F32A Depression, unspecified: Secondary | ICD-10-CM

## 2018-03-21 DIAGNOSIS — F419 Anxiety disorder, unspecified: Secondary | ICD-10-CM

## 2018-03-21 DIAGNOSIS — F329 Major depressive disorder, single episode, unspecified: Secondary | ICD-10-CM

## 2018-03-21 MED ORDER — DIAZEPAM 5 MG PO TABS: ORAL_TABLET | ORAL | 0 refills | Status: DC

## 2018-03-21 NOTE — Telephone Encounter (Signed)
Rx called in to pharmacy. 

## 2018-03-21 NOTE — Telephone Encounter (Signed)
Patient called office to inquire about her prescription for Valium, patient states that she contacted [facility] and they stated that they had no received Rx. Emily Bender

## 2018-03-21 NOTE — Telephone Encounter (Signed)
Sent to [facility] 

## 2018-05-23 ENCOUNTER — Other Ambulatory Visit: Payer: Self-pay | Admitting: Family Medicine

## 2018-05-23 DIAGNOSIS — F329 Major depressive disorder, single episode, unspecified: Secondary | ICD-10-CM

## 2018-05-23 DIAGNOSIS — F32A Depression, unspecified: Secondary | ICD-10-CM

## 2018-05-23 DIAGNOSIS — F419 Anxiety disorder, unspecified: Principal | ICD-10-CM

## 2018-07-14 ENCOUNTER — Other Ambulatory Visit: Payer: Self-pay | Admitting: Family Medicine

## 2018-07-14 DIAGNOSIS — M5416 Radiculopathy, lumbar region: Secondary | ICD-10-CM

## 2018-07-22 ENCOUNTER — Other Ambulatory Visit: Payer: Self-pay | Admitting: Family Medicine

## 2018-07-22 DIAGNOSIS — M5416 Radiculopathy, lumbar region: Secondary | ICD-10-CM

## 2018-08-14 ENCOUNTER — Other Ambulatory Visit: Payer: Self-pay

## 2018-08-14 DIAGNOSIS — M5416 Radiculopathy, lumbar region: Secondary | ICD-10-CM

## 2018-08-15 MED ORDER — MELOXICAM 7.5 MG PO TABS: 7.5000 mg | ORAL_TABLET | Freq: Two times a day (BID) | ORAL | 6 refills | Status: DC

## 2018-09-05 ENCOUNTER — Other Ambulatory Visit: Payer: Self-pay

## 2018-09-05 ENCOUNTER — Encounter: Payer: Self-pay | Admitting: Orthopaedic Surgery

## 2018-09-05 ENCOUNTER — Ambulatory Visit: Payer: Self-pay

## 2018-09-05 ENCOUNTER — Ambulatory Visit: Payer: BLUE CROSS/BLUE SHIELD | Admitting: Orthopaedic Surgery

## 2018-09-05 ENCOUNTER — Ambulatory Visit: Payer: BLUE CROSS/BLUE SHIELD

## 2018-09-05 DIAGNOSIS — M545 Low back pain, unspecified: Secondary | ICD-10-CM

## 2018-09-05 DIAGNOSIS — M25551 Pain in right hip: Secondary | ICD-10-CM

## 2018-09-05 DIAGNOSIS — M25552 Pain in left hip: Secondary | ICD-10-CM

## 2018-09-05 MED ORDER — METHYLPREDNISOLONE 4 MG PO TABS: ORAL_TABLET | ORAL | 0 refills | Status: DC

## 2018-09-05 NOTE — Progress Notes (Signed)
Office Visit Note   Patient: Emily Bender           Date of Birth: 01/22/1966           MRN: [id number] Visit Date: 09/05/2018              Requested by: Margo Common, [address] [place], [place] [address] PCP: Margo Common, [place]   Assessment & Plan: Visit Diagnoses:  1. Low back pain, unspecified back pain laterality, unspecified chronicity, unspecified whether sciatica present   2. Bilateral hip pain     Plan: We will send her to physical therapy for core strengthening, IT band stretching, home exercise program, modalities and back exercises.  Placed on a Medrol Dosepak no NSAIDs while on the Medrol Dosepak as discussed with patient.  Have her follow-up in 1 month sooner if there is any questions concerns.  Did discuss with her if her symptoms become worse or she develops any bowel bladder dysfunction or saddle anesthesia like symptoms which are discussed she should go to the ER.  Follow-Up Instructions: Return in about 4 weeks (around 10/03/2018).   Orders:  Orders Placed This Encounter  Procedures  . XR Lumbar Spine 2-3 Views  . XR HIP UNILAT W OR W/O PELVIS 2-3 VIEWS LEFT  . XR HIP UNILAT W OR W/O PELVIS 2-3 VIEWS RIGHT   Meds ordered this encounter  Medications  . methylPREDNISolone (MEDROL) 4 MG tablet    Sig: Take as directed    Dispense:  21 tablet    Refill:  0      Procedures: No procedures performed   Clinical Data: No additional findings.   Subjective: Chief Complaint  Patient presents with  . Lower Back - Pain    HPI Patient is a 53 year old female comes in today with low back pain and bilateral hip pain.  She states that she has a history of disc fracture L4-5 and possibly 25 years ago.  She underwent surgery for this.  She has been told in the past that she has spinal stenosis.  She is had epidural steroid injections in the past last one was 04/2016.  She has periodic flares of low back pain.  She states she started having some  pain in the low back 3 weeks ago and then developed developed left-sided hip to knee pain about a week ago about 2 days ago she developed pain in the right lower leg.  She is having numbness tingling in both legs.  Stabbing pain in the buttocks region and lateral aspects of bilateral hips.  Having some groin pain.  She has difficulty weightbearing due to the pain.  Pain does awaken her at night.  Said no recent MRI.  No recent injury.  She has seen chiropractor in the past when the TENS unit at home which she is tried on her back.  She denies any change in bowel or bladder function.  She denies any saddle anesthesia like symptoms.  She has taken Tylenol, gabapentin, Flexeril and Mobic for the pain without any real relief.  She is not diabetic.  Review of Systems Denies any fevers or chills.  Please see HPI otherwise negative  Objective: Vital Signs: There were no vitals taken for this visit.  Physical Exam Constitutional:      Appearance: She is not ill-appearing or diaphoretic.  Pulmonary:     Effort: Pulmonary effort is normal.  Neurological:     Mental Status: She is alert and oriented  to person, place, and time.  Psychiatric:        Mood and Affect: Mood normal.        Behavior: Behavior normal.     Ortho Exam Bilateral hips good range of motion without pain.  She has tenderness over the lateral aspect of both hips trochanteric region.  Negative straight leg raise bilaterally.  She is able to touch her toes bilaterally.  She has increased pain with extension of lumbar spine.  She is able to limp on her tiptoes and heels.  Strength throughout lower extremities against resistance.  Deep tendon reflexes are 2+ at the knees and ankles and equal and symmetric.  Dorsal pedal pulses are 2+. Specialty Comments:  No specialty comments available.  Imaging: Xr Hip Unilat W Or W/o Pelvis 2-3 Views Left  Result Date: 09/05/2018 AP pelvis lateral view left hip: AP pelvis shows bilateral hips to  be well located.  Hips appear well preserved.  Acute fractures bony abnormalities.  Left lateral hip radiograph shows no acute fractures.  EKG hip joint appears well maintained.  Xr Hip Unilat W Or W/o Pelvis 2-3 Views Right  Result Date: 09/05/2018  Lateral view right hip: No acute fractures.  Hip joints well maintained.  No bony abnormalities.  Xr Lumbar Spine 2-3 Views  Result Date: 09/05/2018 Lumbar spine 2 views AP and lateral: No acute fractures.  Grade 1 spondylolisthesis L4 3 on 4.  Facet degenerative changes lower lumbar spine.  Endplate spurring at multiple levels in the lumbar spine.  Slight loss of lordotic curvature.  Slight scoliosis seen on the AP view.    PMFS History: Patient Active Problem List   Diagnosis Date Noted  . Alcohol drinker 05/20/2015  . Anxiety and depression 05/20/2015  . History of artificial joint 05/20/2015  . HLD (hyperlipidemia) 05/20/2015  . Avitaminosis D 05/20/2015  . Chronic cough 01/20/2014  . Adiposity 01/18/2012  . Irregular bleeding 11/27/2006  . Tobacco use 06/23/2005   Past Medical History:  Diagnosis Date  . Anxiety   . Depression   . Hyperlipidemia     Family History  Problem Relation Age of Onset  . Lung cancer Father   . Healthy Brother   . Aneurysm Maternal Grandmother   . Lung cancer Maternal Grandfather   . Coronary artery disease Paternal Grandmother   . Diabetes Mother   . Hypertension Mother     Past Surgical History:  Procedure Laterality Date  . ANKLE SURGERY  01/17/2012  . BREAST BIOPSY Left 1990's  . CHOLECYSTECTOMY  2000  . LAMINECTOMY  1996   Social History   Occupational History  . Not on file  Tobacco Use  . Smoking status: Current Every Day Smoker    Packs/day: 1.00    Years: 20.00    Pack years: 20.00    Types: Cigarettes  . Smokeless tobacco: Never Used  Substance and Sexual Activity  . Alcohol use: Yes    Alcohol/week: 0.0 standard drinks    Comment: moderate use- drinks 4-6 beers a day   . Drug use: Yes    Comment: uses recreational drugs  . Sexual activity: Not on file

## 2018-09-14 ENCOUNTER — Telehealth: Payer: Self-pay | Admitting: *Deleted

## 2018-09-14 NOTE — Telephone Encounter (Signed)
Pt returned my call from the message I left this morning.    I let know that she may have been potentially exposed to an employee who later tested positive for COVID-19.   We are offering free testing.    She wanted to think about it and call back.   I gave her the [phone].

## 2018-10-03 ENCOUNTER — Telehealth: Payer: Self-pay | Admitting: Orthopaedic Surgery

## 2018-10-03 MED ORDER — TRAMADOL HCL 50 MG PO TABS: 50.0000 mg | ORAL_TABLET | Freq: Four times a day (QID) | ORAL | 0 refills | Status: DC | PRN

## 2018-10-03 NOTE — Telephone Encounter (Signed)
Was just seen 09/05/18 she is asking for something for pain

## 2018-10-03 NOTE — Telephone Encounter (Signed)
Tramadol 50 mg one po q 6 #40 zero

## 2018-10-03 NOTE — Telephone Encounter (Signed)
Can you call this in for me?

## 2018-10-03 NOTE — Telephone Encounter (Signed)
Patient called asked if she can get something for the pain she is having. Patient said she has been in pain since her last appointment and it is not getting better. The number to contact patient is [phone]

## 2018-10-03 NOTE — Telephone Encounter (Signed)
Called to pharmacy Advised patient done.

## 2018-10-07 ENCOUNTER — Encounter: Payer: Self-pay | Admitting: Orthopaedic Surgery

## 2018-10-07 ENCOUNTER — Other Ambulatory Visit: Payer: Self-pay

## 2018-10-07 ENCOUNTER — Ambulatory Visit (INDEPENDENT_AMBULATORY_CARE_PROVIDER_SITE_OTHER): Payer: BC Managed Care – PPO | Admitting: Orthopaedic Surgery

## 2018-10-07 DIAGNOSIS — M5442 Lumbago with sciatica, left side: Secondary | ICD-10-CM | POA: Diagnosis not present

## 2018-10-07 DIAGNOSIS — M4807 Spinal stenosis, lumbosacral region: Secondary | ICD-10-CM

## 2018-10-07 NOTE — Progress Notes (Signed)
The patient comes in today with continued severe low back pain with left-sided radicular symptoms.  We have tried a steroid taper on her.  She try to get the therapy but her pain was too severe for her to go.  She is worked on a home exercise program with back extension exercises.  Her pain is still persistent and worse.  She is tried TENS unit as well.  She is walking hunched over.  It hurts into the groin and down her leg.  X-rays of her hips were normal and her hip exam is normal.  She has a positive straight leg raise to the left side.  She has severe low back pain with flexion extension.  The plain films of her lumbar spine showed significant anterolisthesis at L4-L5.  She had had back injections remotely over a decade ago.  At this point I feel it is urgent that we obtain an MRI of her lumbar spine given the severity of her pain that is worsening.  I want her to watch for any acute changes in bowel bladder function.  There is weakness now on her left lower extremity compared to the right side and her reflexes are not equal either.  At this point an MRI is warranted.  We will see her back once we have this MRI.

## 2018-10-09 DIAGNOSIS — M545 Low back pain: Secondary | ICD-10-CM | POA: Diagnosis not present

## 2018-10-14 ENCOUNTER — Encounter: Payer: Self-pay | Admitting: Orthopaedic Surgery

## 2018-10-14 ENCOUNTER — Ambulatory Visit (INDEPENDENT_AMBULATORY_CARE_PROVIDER_SITE_OTHER): Payer: BC Managed Care – PPO | Admitting: Orthopaedic Surgery

## 2018-10-14 ENCOUNTER — Other Ambulatory Visit: Payer: Self-pay

## 2018-10-14 DIAGNOSIS — M5442 Lumbago with sciatica, left side: Secondary | ICD-10-CM | POA: Diagnosis not present

## 2018-10-14 DIAGNOSIS — M5126 Other intervertebral disc displacement, lumbar region: Secondary | ICD-10-CM | POA: Diagnosis not present

## 2018-10-14 MED ORDER — HYDROCODONE-ACETAMINOPHEN 5-325 MG PO TABS: 1.0000 | ORAL_TABLET | Freq: Four times a day (QID) | ORAL | 0 refills | Status: DC | PRN

## 2018-10-14 NOTE — Progress Notes (Signed)
The patient comes in today for follow-up after having an acute MRI of her lumbar spine.  When I saw her last visit she was having quite significant radicular symptoms in her sciatic area to the left side rating down her left leg.  She is walking hunched over position as well and had a positive straight leg raise is very concerned about a herniated disc.  She still has the same symptoms and is not getting better.  She returns to go over the MRI.  MRI is reviewed with her and there is severe stenosis at L4-L5.  There is herniated disc at this level with a disc fragment that is migrated to the L3 level.  It looks like that it is likely contacting the L4 nerve root.  On exam this corresponds with the symptoms that she is feeling to the left side.  She still hunched over and in a consider amount of pain.  She still denies any change in bowel or bladder function but does report continued pain and radicular symptoms going down her left leg.  We will need to work her in with Dr. Lorin Mercy in the next 1 to 2 days to see if he feels that she needs a surgical intervention.  I will go ahead and send in some hydrocodone for her for pain.

## 2018-10-15 ENCOUNTER — Encounter: Payer: Self-pay | Admitting: Orthopaedic Surgery

## 2018-10-15 ENCOUNTER — Ambulatory Visit (INDEPENDENT_AMBULATORY_CARE_PROVIDER_SITE_OTHER): Payer: BC Managed Care – PPO | Admitting: Orthopaedic Surgery

## 2018-10-15 VITALS — Ht 67.0 in | Wt 219.0 lb

## 2018-10-15 DIAGNOSIS — M48061 Spinal stenosis, lumbar region without neurogenic claudication: Secondary | ICD-10-CM

## 2018-10-15 DIAGNOSIS — M5126 Other intervertebral disc displacement, lumbar region: Secondary | ICD-10-CM

## 2018-10-15 NOTE — Progress Notes (Signed)
Office Visit Note   Patient: Emily Bender           Date of Birth: [birth year]           MRN: [id number] Visit Date: 10/15/2018              Requested by: Margo Common, [address] [place] [place],  [place] [address] PCP: Margo Common, [place]   Assessment & Plan: Visit Diagnoses:  1. Lumbar herniated disc   2. Spinal stenosis of lumbar region without neurogenic claudication     Plan: We reviewed MRI scan as well as plain radiographs with patient.  She has facet degenerative changes L4-5 with anterolisthesis and plan would be left L4 and L3 hemilaminectomy with removal of migrated free fragment which is large and migrated up behind the L4 body almost up to the L3-4 disc space.  She has had problems with spinal stenosis and degenerative changes she understands that she may require fusion in the future.  Ligament lamina on the right side would give her some stability with her anterolisthesis.  She understands if she begins shift forward then instrumented fusion would be required.  She has degenerative changes at other levels with L2-3 slight retrolisthesis with broad-based subligamentous disc extrusion without severe compression.  L3-4 minimal retrolisthesis and also marked disc space narrowing at L5-S1 with some soft tissue in the lateral recess which may be scar tissue or some small amount of recurrent disc from her previous surgery.  There is moderate foraminal stenosis at L5-S1 and as I discussed with her a single level fusion at L4-5 would load all these other levels that already have degenerative changes.  Procedure discussed questions elicited and answered plan to be overnight stay.  Risks of dural tear recurrent disc herniation progressive degeneration requiring fusion all discussed..  Questions were elicited and answered she understands request to proceed.  Follow-Up Instructions: No follow-ups on file.   Orders:  No orders of the defined types were placed in this  encounter.  No orders of the defined types were placed in this encounter.     Procedures: No procedures performed   Clinical Data: No additional findings.   Subjective: Chief Complaint  Patient presents with   Lower Back - Pain    HPI 53 year old female referred to me by Dr. Ninfa Linden for problems with severe back pain left leg pain inability to stand upright now for 8 weeks.  She has had a known diagnosis of spinal stenosis has had multiple epidural injections in the past with periodic flare of of her back.  States she was swinging golf club at work suddenly had excruciating pain radiated from her back into her leg inability to get upright and she has been walking with both hands on her knees now for 6 weeks.  She is been on prednisone Dosepak only take hydrocodone having trouble sleeping she is used a TENS unit home exercise program.  She has some degenerative anterolisthesis at L4-5 with intact pars.  MRI scan 10/09/2018 showed severe spinal stenosis multifactorial at L4-5 with extruded disc fragment from L4-5 migrated superiorly behind the body of L3 all the way to the L3-4 disc space.  Patient also had some abnormal soft tissue density in the left lateral recess at L5-S1 where patient had previous surgery by Dr. Maryjean Ka in the early 1980s.  Patient's pain is radiated from her back and her buttocks into her posterior lateral thigh stops just below the knee.  No pain down  to the foot.  She is noted giving way weakness of her quadriceps on the left none on the right.  She is grabbed multiple objects and fallen against some but is not fallen and landed on the floor.  Patient states the pain is 10 out of 10 and is unbearable.  Patient's been treated by chiropractor also physical therapy without relief.  Past episodes when she had back pain after period of several days or week she got resolution of symptoms.  Patient has been on meloxicam, tramadol, gabapentin, prednisone pack without  relief.  Review of Systems no history of diabetes patient does use alcohol.  Previous left L5-S1 microdiscectomy early 1980s by Dr. Clydell Hakim.  Positive anxiety and depression, hyperlipidemia, tobacco use.  Spinal stenosis and currently HNP L4-5 with left radiculopathy.  History of wrist fracture.  Right ankle arthroplasty done at [facility] 2015.   Objective: Vital Signs: Ht 5\' 7"  (1.702 m)    Wt 219 lb (99.3 kg)    BMI 34.30 kg/m   Physical Exam Constitutional:      Appearance: She is well-developed.     Comments: Patient in obvious distress sitting she leans to the right side cannot sit flat unable to stand up.  HENT:     Head: Normocephalic.     Right Ear: External ear normal.     Left Ear: External ear normal.  Eyes:     Pupils: Pupils are equal, round, and reactive to light.  Neck:     Thyroid: No thyromegaly.     Trachea: No tracheal deviation.  Cardiovascular:     Rate and Rhythm: Normal rate.  Pulmonary:     Effort: Pulmonary effort is normal.  Abdominal:     Palpations: Abdomen is soft.  Skin:    General: Skin is warm and dry.  Neurological:     Mental Status: She is alert and oriented to person, place, and time.  Psychiatric:        Behavior: Behavior normal.     Ortho Exam patient has straight and straight leg raising 70 degrees on the left negative on the right.  Distal pulses are 2+ and palpable.  Quad weakness on the left 1 grade.  She can only stand keeping her hands on her knees cannot extend at the hips done upright position and walks with both hands on her knees.  Anterior tib gastrocsoleus is strong and symmetrical with resisted manual testing.  Abductor to the hips are normal quad strength on the right is normal significant quad weakness on the left.  Due to pain she is unable able to heel or toe walk.  Decreased sensation lateral thigh that extends just below the knee.  Patient is unable to stand any left hip extension with severe radicular pain into her thigh.   Negative straight leg raising on the right leg.  Specialty Comments:  No specialty comments available.  Imaging: No results found.   PMFS History: Patient Active Problem List   Diagnosis Date Noted   Spinal stenosis of lumbar region 10/16/2018   Lumbar herniated disc 10/14/2018   Alcohol drinker 05/20/2015   Anxiety and depression 05/20/2015   History of artificial joint 05/20/2015   HLD (hyperlipidemia) 05/20/2015   Avitaminosis D 05/20/2015   Chronic cough 01/20/2014   Adiposity 01/18/2012   Irregular bleeding 11/27/2006   Tobacco use 06/23/2005   Past Medical History:  Diagnosis Date   Anxiety    Depression    Hyperlipidemia     Family  History  Problem Relation Age of Onset   Lung cancer Father    Healthy Brother    Aneurysm Maternal Grandmother    Lung cancer Maternal Grandfather    Coronary artery disease Paternal Grandmother    Diabetes Mother    Hypertension Mother     Past Surgical History:  Procedure Laterality Date   ANKLE SURGERY  01/17/2012   BREAST BIOPSY Left 1990's   CHOLECYSTECTOMY  2000   LAMINECTOMY  1996   Social History   Occupational History   Not on file  Tobacco Use   Smoking status: Current Every Day Smoker    Packs/day: 1.00    Years: 20.00    Pack years: 20.00    Types: Cigarettes   Smokeless tobacco: Never Used  Substance and Sexual Activity   Alcohol use: Yes    Alcohol/week: 0.0 standard drinks    Comment: moderate use- drinks 4-6 beers a day   Drug use: Yes    Comment: uses recreational drugs   Sexual activity: Not on file

## 2018-10-16 DIAGNOSIS — M48061 Spinal stenosis, lumbar region without neurogenic claudication: Secondary | ICD-10-CM | POA: Insufficient documentation

## 2018-10-22 NOTE — Pre-Procedure Instructions (Addendum)
AFTIN LYE  10/22/2018     [facility] - [place], [address] [place] [address] Phone: [phone] Fax: [phone]   Your procedure is scheduled on Wednesday, July 15th  Report to [facility] Entrance A at 1:00 P.M.  Call this number if you have problems the morning of surgery:  [phone]   Remember:  Do not eat or drink after midnight.  You may drink clear liquids until 12:00 P.M.  Clear liquids allowed are: Water, Juice (non-citric and without pulp), Carbonated beverages, Clear Tea, Black Coffee only, Plain Jell-O only and Gatorade   Please complete your PRE-SURGERY ENSURE that was provided to you by 11:00 A.M. the morning of surgery.  Please, if able, drink it in one setting. DO NOT SIP.   Take these medicines the morning of surgery with A SIP OF WATER  gabapentin (NEURONTIN)   If needed - diazepam (VALIUM), HYDROcodone-acetaminophen (NORCO/VICODIN), traMADol (ULTRAM)   7 days prior to surgery STOP taking meloxicam (MOBIC), any Aspirin (unless otherwise instructed by your surgeon), Aleve, Naproxen, Ibuprofen, Motrin, Advil, Goody's, BC's, all herbal medications, fish oil, and all vitamins.    Special instructions:   [place]- Preparing For Surgery  Before surgery, you can play an important role. Because skin is not sterile, your skin needs to be as free of germs as possible. You can reduce the number of germs on your skin by washing with CHG (chlorahexidine gluconate) Soap before surgery.  CHG is an antiseptic cleaner which kills germs and bonds with the skin to continue killing germs even after washing.    Oral Hygiene is also important to reduce your risk of infection.  Remember - BRUSH YOUR TEETH THE MORNING OF SURGERY WITH YOUR REGULAR TOOTHPASTE  Please do not use if you have an allergy to CHG or antibacterial soaps. If your skin becomes reddened/irritated stop using the CHG.  Do not shave (including legs and  underarms) for at least 48 hours prior to first CHG shower. It is OK to shave your face.  Please follow these instructions carefully.   1. Shower the NIGHT BEFORE SURGERY and the MORNING OF SURGERY with CHG.   2. If you chose to wash your hair, wash your hair first as usual with your normal shampoo.  3. After you shampoo, rinse your hair and body thoroughly to remove the shampoo.  4. Use CHG as you would any other liquid soap. You can apply CHG directly to the skin and wash gently with a scrungie or a clean washcloth.   5. Apply the CHG Soap to your body ONLY FROM THE NECK DOWN.  Do not use on open wounds or open sores. Avoid contact with your eyes, ears, mouth and genitals (private parts). Wash Face and genitals (private parts)  with your normal soap.  6. Wash thoroughly, paying special attention to the area where your surgery will be performed.  7. Thoroughly rinse your body with warm water from the neck down.  8. DO NOT shower/wash with your normal soap after using and rinsing off the CHG Soap.  9. Pat yourself dry with a CLEAN TOWEL.  10. Wear CLEAN PAJAMAS to bed the night before surgery, wear comfortable clothes the morning of surgery  11. Place CLEAN SHEETS on your bed the night of your first shower and DO NOT SLEEP WITH PETS.  Day of Surgery: Do not wear jewelry, make-up or nail polish.  Do not wear lotions, powders,  or perfumes/cologne, or deodorant.  Do not shave 48 hours prior to surgery. Men may shave neck and face.  Do not bring valuables to the hospital.  [facility] is not responsible for any belongings or valuables.  Please wear clean clothes to the hospital/surgery center.   Remember to brush your teeth WITH YOUR REGULAR TOOTHPASTE.  Contacts, dentures or bridgework may not be worn into surgery.  Leave your suitcase in the car.  After surgery it may be brought to your room.  For patients admitted to the hospital, discharge time will be determined by your  treatment team.  Patients discharged the day of surgery will not be allowed to drive home.   Please read over the following fact sheets that you were given. Pain Booklet, Coughing and Deep Breathing, MRSA Information and Surgical Site Infection Prevention

## 2018-10-23 ENCOUNTER — Encounter (HOSPITAL_COMMUNITY)
Admission: RE | Admit: 2018-10-23 | Discharge: 2018-10-23 | Disposition: A | Discharge status: Home / Self Care | Payer: BC Managed Care – PPO | Source: Ambulatory Visit | Attending: Orthopaedic Surgery | Admitting: Orthopaedic Surgery

## 2018-10-23 ENCOUNTER — Other Ambulatory Visit: Payer: Self-pay

## 2018-10-23 ENCOUNTER — Ambulatory Visit (HOSPITAL_COMMUNITY)
Admission: RE | Admit: 2018-10-23 | Discharge: 2018-10-23 | Disposition: A | Discharge status: Home / Self Care | Payer: BC Managed Care – PPO | Source: Ambulatory Visit | Attending: Surgery | Admitting: Surgery

## 2018-10-23 ENCOUNTER — Encounter (HOSPITAL_COMMUNITY): Payer: Self-pay

## 2018-10-23 DIAGNOSIS — Z01818 Encounter for other preprocedural examination: Secondary | ICD-10-CM | POA: Diagnosis not present

## 2018-10-23 DIAGNOSIS — Z01811 Encounter for preprocedural respiratory examination: Secondary | ICD-10-CM | POA: Diagnosis not present

## 2018-10-23 DIAGNOSIS — I700 Atherosclerosis of aorta: Secondary | ICD-10-CM | POA: Diagnosis not present

## 2018-10-23 HISTORY — DX: Unspecified osteoarthritis, unspecified site: M19.90

## 2018-10-23 LAB — URINALYSIS, ROUTINE W REFLEX MICROSCOPIC
Bacteria, UA: NONE SEEN
Bilirubin Urine: NEGATIVE
Glucose, UA: NEGATIVE mg/dL
Ketones, ur: NEGATIVE mg/dL
Leukocytes,Ua: NEGATIVE
Nitrite: NEGATIVE
Protein, ur: NEGATIVE mg/dL
Specific Gravity, Urine: 1.018 (ref 1.005–1.030)
pH: 5 (ref 5.0–8.0)

## 2018-10-23 LAB — COMPREHENSIVE METABOLIC PANEL
ALT: 19 U/L (ref 0–44)
AST: 17 U/L (ref 15–41)
Albumin: 4.2 g/dL (ref 3.5–5.0)
Alkaline Phosphatase: 62 U/L (ref 38–126)
Anion gap: 12 (ref 5–15)
BUN: 9 mg/dL (ref 6–20)
CO2: 21 mmol/L — ABNORMAL LOW (ref 22–32)
Calcium: 9.6 mg/dL (ref 8.9–10.3)
Chloride: 104 mmol/L (ref 98–111)
Creatinine, Ser: 0.6 mg/dL (ref 0.44–1.00)
GFR calc Af Amer: >60 mL/min (ref >60)
GFR calc non Af Amer: >60 mL/min (ref >60)
Glucose, Bld: 105 mg/dL — ABNORMAL HIGH (ref 70–99)
Potassium: 4.3 mmol/L (ref 3.5–5.1)
Sodium: 137 mmol/L (ref 135–145)
Total Bilirubin: 0.5 mg/dL (ref 0.3–1.2)
Total Protein: 6.9 g/dL (ref 6.5–8.1)

## 2018-10-23 LAB — CBC
HCT: 49.1 % — ABNORMAL HIGH (ref 36.0–46.0)
Hemoglobin: 16.3 g/dL — ABNORMAL HIGH (ref 12.0–15.0)
MCH: 31.1 pg (ref 26.0–34.0)
MCHC: 33.2 g/dL (ref 30.0–36.0)
MCV: 93.7 fL (ref 80.0–100.0)
Platelets: 294 10*3/uL (ref 150–400)
RBC: 5.24 MIL/uL — ABNORMAL HIGH (ref 3.87–5.11)
RDW: 13 % (ref 11.5–15.5)
WBC: 10.3 10*3/uL (ref 4.0–10.5)
nRBC: 0 % (ref 0.0–0.2)

## 2018-10-23 LAB — SURGICAL PCR SCREEN
MRSA, PCR: NEGATIVE
Staphylococcus aureus: NEGATIVE

## 2018-10-23 NOTE — Pre-Procedure Instructions (Signed)
Emily Bender  10/23/2018     [facility] - [place], [place] [address] Phone: [phone] Fax: [phone]   Your procedure is scheduled on Wednesday, July 15th  Report to [facility] Entrance A at 1:00 P.M. and check in at the admitting office.  Call this number if you have problems the morning of surgery:  [phone]   Remember:  Do not eat after midnight the night before your surgery.   You may drink clear liquids until 12:00 Noon the day of your surgery.   Clear liquids allowed are: Water, Juice (non-citric and without pulp), Carbonated beverages, Clear Tea, Black Coffee only, Plain Jell-O only and Gatorade   Please complete your PRE-SURGERY ENSURE that was provided to you by 11:00 A.M. the morning of surgery.  Please, if able, drink it in one sitting. DO NOT SIP.   Take these medicines the morning of surgery with A SIP OF WATER  gabapentin (NEURONTIN)   If needed you may take: acetaminophen (Tylenol) diazepam (VALIUM) HYDROcodone-acetaminophen (NORCO/VICODIN)   7 days prior to surgery STOP taking meloxicam (MOBIC), any Aspirin (unless otherwise instructed by your surgeon), Aleve, Naproxen, Ibuprofen, Motrin, Advil, Goody's, BC's, all herbal medications, fish oil, and all vitamins.    Special instructions:   [place]- Preparing For Surgery  Before surgery, you can play an important role. Because skin is not sterile, your skin needs to be as free of germs as possible. You can reduce the number of germs on your skin by washing with CHG (chlorahexidine gluconate) Soap before surgery.  CHG is an antiseptic cleaner which kills germs and bonds with the skin to continue killing germs even after washing.    Oral Hygiene is also important to reduce your risk of infection.  Remember - BRUSH YOUR TEETH THE MORNING OF SURGERY WITH YOUR REGULAR TOOTHPASTE  Please do not use if you have an allergy to CHG or antibacterial  soaps. If your skin becomes reddened/irritated stop using the CHG.  Do not shave (including legs and underarms) for at least 48 hours prior to first CHG shower. It is OK to shave your face.  Please follow these instructions carefully.   1. Shower the NIGHT BEFORE SURGERY and the MORNING OF SURGERY with CHG.   2. If you chose to wash your hair, wash your hair first as usual with your normal shampoo.  3. After you shampoo, rinse your hair and body thoroughly to remove the shampoo.  4. Use CHG as you would any other liquid soap. You can apply CHG directly to the skin and wash gently with a scrungie or a clean washcloth.   5. Apply the CHG Soap to your body ONLY FROM THE NECK DOWN.  Do not use on open wounds or open sores. Avoid contact with your eyes, ears, mouth and genitals (private parts). Wash Face and genitals (private parts)  with your normal soap.  6. Wash thoroughly, paying special attention to the area where your surgery will be performed.  7. Thoroughly rinse your body with warm water from the neck down.  8. DO NOT shower/wash with your normal soap after using and rinsing off the CHG Soap.  9. Pat yourself dry with a CLEAN TOWEL.  10. Wear CLEAN PAJAMAS to bed the night before surgery, wear comfortable clothes the morning of surgery  11. Place CLEAN SHEETS on your bed the night of your first shower and DO NOT SLEEP WITH  PETS.  Day of Surgery: Do not wear jewelry, make-up or nail polish.  Do not wear lotions, powders, or perfumes/cologne, or deodorant.  Do not shave 48 hours prior to surgery. Men may shave neck and face.  Do not bring valuables to the hospital.  [facility] is not responsible for any belongings or valuables.  Please wear clean clothes to the hospital/surgery center.   Remember to brush your teeth WITH YOUR REGULAR TOOTHPASTE.  Contacts, dentures or bridgework may not be worn into surgery.  Leave your suitcase in the car.  After surgery it may be brought to  your room.  For patients admitted to the hospital, discharge time will be determined by your treatment team.  Patients discharged the day of surgery will not be allowed to drive home.   Please read over the following fact sheets that you were given. Pain Booklet, Coughing and Deep Breathing, MRSA Information and Surgical Site Infection Prevention

## 2018-10-23 NOTE — Progress Notes (Addendum)
PCP - Dr. Natale Milch Cardiologist - patient denies  Chest x-ray - 10/23/2018 EKG - n/a Stress Test - patient denies ECHO - patient denies Cardiac Cath - patient denies  Sleep Study - patient denies CPAP -   Fasting Blood Sugar - n/a Checks Blood Sugar _____ times a day  Blood Thinner Instructions: n/a Aspirin Instructions: n/a  Anesthesia review: n/a  Patient denies shortness of breath, fever, cough and chest pain at PAT appointment   Coronavirus Screening  Have you experienced the following symptoms:  Cough yes/no: No Fever (>100.51F)  yes/no: No Runny nose yes/no: No Sore throat yes/no: No Difficulty breathing/shortness of breath  yes/no: No  Have you or a family member traveled in the last 14 days and where? yes/no: No   If the patient indicates "YES" to the above questions, their PAT will be rescheduled to limit the exposure to others and, the surgeon will be notified. THE PATIENT WILL NEED TO BE ASYMPTOMATIC FOR 14 DAYS.   If the patient is not experiencing any of these symptoms, the PAT nurse will instruct them to NOT bring anyone with them to their appointment since they may have these symptoms or traveled as well.   Please remind your patients and families that hospital visitation restrictions are in effect and the importance of the restrictions.     Patient verbalized understanding of instructions that were given to them at the PAT appointment. Patient was also instructed that they will need to review over the PAT instructions again at home before surgery.

## 2018-10-24 ENCOUNTER — Other Ambulatory Visit: Payer: Self-pay

## 2018-10-26 ENCOUNTER — Other Ambulatory Visit (HOSPITAL_COMMUNITY): Payer: BC Managed Care – PPO

## 2018-10-28 ENCOUNTER — Other Ambulatory Visit
Admission: RE | Admit: 2018-10-28 | Discharge: 2018-10-28 | Disposition: A | Discharge status: Home / Self Care | Payer: BC Managed Care – PPO | Source: Ambulatory Visit | Attending: Orthopaedic Surgery | Admitting: Orthopaedic Surgery

## 2018-10-28 ENCOUNTER — Other Ambulatory Visit: Payer: Self-pay

## 2018-10-28 DIAGNOSIS — Z1159 Encounter for screening for other viral diseases: Secondary | ICD-10-CM | POA: Insufficient documentation

## 2018-10-29 LAB — SARS CORONAVIRUS 2 (TAT 6-24 HRS): SARS Coronavirus 2: NEGATIVE

## 2018-10-30 ENCOUNTER — Observation Stay (HOSPITAL_COMMUNITY)
Admission: RE | Admit: 2018-10-30 | Discharge: 2018-10-31 | Disposition: A | Discharge status: Home / Self Care | Payer: BC Managed Care – PPO | Attending: Orthopaedic Surgery | Admitting: Orthopaedic Surgery

## 2018-10-30 ENCOUNTER — Ambulatory Visit (HOSPITAL_COMMUNITY): Payer: BC Managed Care – PPO | Admitting: Certified Registered"

## 2018-10-30 ENCOUNTER — Other Ambulatory Visit: Payer: Self-pay

## 2018-10-30 ENCOUNTER — Encounter (HOSPITAL_COMMUNITY)
Admission: RE | Disposition: A | Discharge status: Home / Self Care | Payer: Self-pay | Source: Home / Self Care | Attending: Orthopaedic Surgery

## 2018-10-30 ENCOUNTER — Encounter (HOSPITAL_COMMUNITY): Payer: Self-pay

## 2018-10-30 ENCOUNTER — Ambulatory Visit (HOSPITAL_COMMUNITY): Payer: BC Managed Care – PPO

## 2018-10-30 DIAGNOSIS — Z419 Encounter for procedure for purposes other than remedying health state, unspecified: Secondary | ICD-10-CM

## 2018-10-30 DIAGNOSIS — F418 Other specified anxiety disorders: Secondary | ICD-10-CM | POA: Diagnosis not present

## 2018-10-30 DIAGNOSIS — M5116 Intervertebral disc disorders with radiculopathy, lumbar region: Principal | ICD-10-CM | POA: Insufficient documentation

## 2018-10-30 DIAGNOSIS — M1990 Unspecified osteoarthritis, unspecified site: Secondary | ICD-10-CM | POA: Insufficient documentation

## 2018-10-30 DIAGNOSIS — M5126 Other intervertebral disc displacement, lumbar region: Secondary | ICD-10-CM | POA: Diagnosis not present

## 2018-10-30 DIAGNOSIS — J449 Chronic obstructive pulmonary disease, unspecified: Secondary | ICD-10-CM | POA: Diagnosis not present

## 2018-10-30 DIAGNOSIS — F329 Major depressive disorder, single episode, unspecified: Secondary | ICD-10-CM | POA: Diagnosis not present

## 2018-10-30 DIAGNOSIS — M4316 Spondylolisthesis, lumbar region: Secondary | ICD-10-CM | POA: Diagnosis not present

## 2018-10-30 DIAGNOSIS — Z882 Allergy status to sulfonamides status: Secondary | ICD-10-CM | POA: Diagnosis not present

## 2018-10-30 DIAGNOSIS — M48061 Spinal stenosis, lumbar region without neurogenic claudication: Secondary | ICD-10-CM | POA: Insufficient documentation

## 2018-10-30 DIAGNOSIS — E559 Vitamin D deficiency, unspecified: Secondary | ICD-10-CM | POA: Insufficient documentation

## 2018-10-30 DIAGNOSIS — Z6835 Body mass index (BMI) 35.0-35.9, adult: Secondary | ICD-10-CM | POA: Insufficient documentation

## 2018-10-30 DIAGNOSIS — F17210 Nicotine dependence, cigarettes, uncomplicated: Secondary | ICD-10-CM | POA: Diagnosis not present

## 2018-10-30 DIAGNOSIS — E785 Hyperlipidemia, unspecified: Secondary | ICD-10-CM | POA: Insufficient documentation

## 2018-10-30 DIAGNOSIS — Z981 Arthrodesis status: Secondary | ICD-10-CM | POA: Diagnosis not present

## 2018-10-30 DIAGNOSIS — F419 Anxiety disorder, unspecified: Secondary | ICD-10-CM | POA: Insufficient documentation

## 2018-10-30 HISTORY — PX: LUMBAR LAMINECTOMY: SHX95

## 2018-10-30 HISTORY — DX: Spinal stenosis, lumbar region without neurogenic claudication: M48.061

## 2018-10-30 SURGERY — MICRODISCECTOMY LUMBAR LAMINECTOMY
Anesthesia: General

## 2018-10-30 MED ORDER — CEFAZOLIN SODIUM-DEXTROSE 2-4 GM/100ML-% IV SOLN
INTRAVENOUS | Status: AC
  Filled 2018-10-30: qty 100

## 2018-10-30 MED ORDER — SODIUM CHLORIDE 0.9 % IV SOLN: 250.0000 mL | INTRAVENOUS | Status: DC

## 2018-10-30 MED ORDER — SERTRALINE HCL 50 MG PO TABS
100.0000 mg | ORAL_TABLET | Freq: Every day | ORAL | Status: DC
  Administered 2018-10-30: 22:00:00 100 mg via ORAL
  Filled 2018-10-30: qty 2

## 2018-10-30 MED ORDER — GABAPENTIN 300 MG PO CAPS
900.0000 mg | ORAL_CAPSULE | Freq: Every day | ORAL | Status: DC
  Administered 2018-10-31: 09:00:00 900 mg via ORAL
  Filled 2018-10-30: qty 3

## 2018-10-30 MED ORDER — ONDANSETRON HCL 4 MG PO TABS: 4.0000 mg | ORAL_TABLET | Freq: Four times a day (QID) | ORAL | Status: DC | PRN

## 2018-10-30 MED ORDER — SUCCINYLCHOLINE CHLORIDE 200 MG/10ML IV SOSY
PREFILLED_SYRINGE | INTRAVENOUS | Status: AC
  Filled 2018-10-30: qty 10

## 2018-10-30 MED ORDER — MEPERIDINE HCL 25 MG/ML IJ SOLN: 6.2500 mg | INTRAMUSCULAR | Status: DC | PRN

## 2018-10-30 MED ORDER — CEFAZOLIN SODIUM-DEXTROSE 2-4 GM/100ML-% IV SOLN
2.0000 g | INTRAVENOUS | Status: AC
  Administered 2018-10-30: 2 g via INTRAVENOUS

## 2018-10-30 MED ORDER — PROPOFOL 10 MG/ML IV BOLUS
INTRAVENOUS | Status: DC | PRN
  Administered 2018-10-30: 120 mg via INTRAVENOUS
  Administered 2018-10-30: 80 mg via INTRAVENOUS

## 2018-10-30 MED ORDER — BUPIVACAINE HCL (PF) 0.25 % IJ SOLN: INTRAMUSCULAR | Status: DC | PRN

## 2018-10-30 MED ORDER — OXYCODONE HCL 5 MG PO TABS
5.0000 mg | ORAL_TABLET | ORAL | Status: DC | PRN
  Administered 2018-10-30 – 2018-10-31 (×4): 5 mg via ORAL
  Filled 2018-10-30 (×4): qty 1

## 2018-10-30 MED ORDER — ACETAMINOPHEN 650 MG RE SUPP: 650.0000 mg | RECTAL | Status: DC | PRN

## 2018-10-30 MED ORDER — GLYCOPYRROLATE PF 0.2 MG/ML IJ SOSY
PREFILLED_SYRINGE | INTRAMUSCULAR | Status: DC | PRN
  Administered 2018-10-30: .1 mg via INTRAVENOUS

## 2018-10-30 MED ORDER — PHENYLEPHRINE 40 MCG/ML (10ML) SYRINGE FOR IV PUSH (FOR BLOOD PRESSURE SUPPORT)
PREFILLED_SYRINGE | INTRAVENOUS | Status: AC
  Filled 2018-10-30: qty 10

## 2018-10-30 MED ORDER — SODIUM CHLORIDE 0.9% FLUSH: 3.0000 mL | INTRAVENOUS | Status: DC | PRN

## 2018-10-30 MED ORDER — DOCUSATE SODIUM 100 MG PO CAPS
100.0000 mg | ORAL_CAPSULE | Freq: Two times a day (BID) | ORAL | Status: DC
  Administered 2018-10-30 – 2018-10-31 (×2): 100 mg via ORAL
  Filled 2018-10-30 (×2): qty 1

## 2018-10-30 MED ORDER — LACTATED RINGERS IV SOLN
INTRAVENOUS | Status: DC
  Administered 2018-10-30: 13:00:00 via INTRAVENOUS

## 2018-10-30 MED ORDER — DEXMEDETOMIDINE HCL 200 MCG/2ML IV SOLN
INTRAVENOUS | Status: DC | PRN
  Administered 2018-10-30: 20 ug via INTRAVENOUS
  Administered 2018-10-30: 12 ug via INTRAVENOUS

## 2018-10-30 MED ORDER — DEXAMETHASONE SODIUM PHOSPHATE 10 MG/ML IJ SOLN
INTRAMUSCULAR | Status: DC | PRN
  Administered 2018-10-30: 10 mg via INTRAVENOUS

## 2018-10-30 MED ORDER — HYDROMORPHONE HCL 1 MG/ML IJ SOLN
0.2500 mg | INTRAMUSCULAR | Status: DC | PRN
  Administered 2018-10-30 (×2): 0.5 mg via INTRAVENOUS

## 2018-10-30 MED ORDER — PHENYLEPHRINE 40 MCG/ML (10ML) SYRINGE FOR IV PUSH (FOR BLOOD PRESSURE SUPPORT)
PREFILLED_SYRINGE | INTRAVENOUS | Status: DC | PRN
  Administered 2018-10-30: 120 ug via INTRAVENOUS
  Administered 2018-10-30 (×2): 80 ug via INTRAVENOUS

## 2018-10-30 MED ORDER — PHENOL 1.4 % MT LIQD: 1.0000 | OROMUCOSAL | Status: DC | PRN

## 2018-10-30 MED ORDER — ROCURONIUM BROMIDE 10 MG/ML (PF) SYRINGE
PREFILLED_SYRINGE | INTRAVENOUS | Status: AC
  Filled 2018-10-30: qty 10

## 2018-10-30 MED ORDER — FENTANYL CITRATE (PF) 250 MCG/5ML IJ SOLN
INTRAMUSCULAR | Status: AC
  Filled 2018-10-30: qty 5

## 2018-10-30 MED ORDER — MIDAZOLAM HCL 5 MG/5ML IJ SOLN
INTRAMUSCULAR | Status: DC | PRN
  Administered 2018-10-30: 2 mg via INTRAVENOUS

## 2018-10-30 MED ORDER — HYDROMORPHONE HCL 1 MG/ML IJ SOLN
INTRAMUSCULAR | Status: AC
  Filled 2018-10-30: qty 1

## 2018-10-30 MED ORDER — LIDOCAINE 2% (20 MG/ML) 5 ML SYRINGE
INTRAMUSCULAR | Status: DC | PRN
  Administered 2018-10-30: 50 mg via INTRAVENOUS

## 2018-10-30 MED ORDER — ACETAMINOPHEN 325 MG PO TABS
650.0000 mg | ORAL_TABLET | ORAL | Status: DC | PRN
  Administered 2018-10-30 – 2018-10-31 (×3): 650 mg via ORAL
  Filled 2018-10-30 (×3): qty 2

## 2018-10-30 MED ORDER — FENTANYL CITRATE (PF) 100 MCG/2ML IJ SOLN
INTRAMUSCULAR | Status: DC | PRN
  Administered 2018-10-30: 50 ug via INTRAVENOUS
  Administered 2018-10-30: 150 ug via INTRAVENOUS
  Administered 2018-10-30: 50 ug via INTRAVENOUS

## 2018-10-30 MED ORDER — 0.9 % SODIUM CHLORIDE (POUR BTL) OPTIME
TOPICAL | Status: DC | PRN
  Administered 2018-10-30: 1000 mL

## 2018-10-30 MED ORDER — CHLORHEXIDINE GLUCONATE 4 % EX LIQD: 60.0000 mL | Freq: Once | CUTANEOUS | Status: DC

## 2018-10-30 MED ORDER — KETAMINE HCL 10 MG/ML IJ SOLN
INTRAMUSCULAR | Status: DC | PRN
  Administered 2018-10-30: 20 mg via INTRAVENOUS
  Administered 2018-10-30: 30 mg via INTRAVENOUS

## 2018-10-30 MED ORDER — ONDANSETRON HCL 4 MG/2ML IJ SOLN
INTRAMUSCULAR | Status: DC | PRN
  Administered 2018-10-30: 4 mg via INTRAVENOUS

## 2018-10-30 MED ORDER — HEMOSTATIC AGENTS (NO CHARGE) OPTIME
TOPICAL | Status: DC | PRN
  Administered 2018-10-30 (×2): 1 via TOPICAL

## 2018-10-30 MED ORDER — PROMETHAZINE HCL 25 MG/ML IJ SOLN: 6.2500 mg | INTRAMUSCULAR | Status: DC | PRN

## 2018-10-30 MED ORDER — DEXAMETHASONE SODIUM PHOSPHATE 10 MG/ML IJ SOLN
INTRAMUSCULAR | Status: AC
  Filled 2018-10-30: qty 1

## 2018-10-30 MED ORDER — ROCURONIUM BROMIDE 50 MG/5ML IV SOSY
PREFILLED_SYRINGE | INTRAVENOUS | Status: DC | PRN
  Administered 2018-10-30: 20 mg via INTRAVENOUS
  Administered 2018-10-30: 60 mg via INTRAVENOUS
  Administered 2018-10-30: 20 mg via INTRAVENOUS

## 2018-10-30 MED ORDER — SODIUM CHLORIDE 0.9% FLUSH: 3.0000 mL | Freq: Two times a day (BID) | INTRAVENOUS | Status: DC

## 2018-10-30 MED ORDER — MIDAZOLAM HCL 2 MG/2ML IJ SOLN: 0.5000 mg | Freq: Once | INTRAMUSCULAR | Status: DC | PRN

## 2018-10-30 MED ORDER — SODIUM CHLORIDE 0.9 % IV SOLN
INTRAVENOUS | Status: DC | PRN
  Administered 2018-10-30: 16:00:00 60 ug/min via INTRAVENOUS

## 2018-10-30 MED ORDER — ONDANSETRON HCL 4 MG/2ML IJ SOLN
INTRAMUSCULAR | Status: AC
  Filled 2018-10-30: qty 2

## 2018-10-30 MED ORDER — LACTATED RINGERS IV SOLN
INTRAVENOUS | Status: DC | PRN
  Administered 2018-10-30 (×2): via INTRAVENOUS

## 2018-10-30 MED ORDER — CEFAZOLIN SODIUM-DEXTROSE 1-4 GM/50ML-% IV SOLN
1.0000 g | Freq: Three times a day (TID) | INTRAVENOUS | Status: AC
  Administered 2018-10-30 – 2018-10-31 (×2): 1 g via INTRAVENOUS
  Filled 2018-10-30 (×2): qty 50

## 2018-10-30 MED ORDER — ONDANSETRON HCL 4 MG/2ML IJ SOLN: 4.0000 mg | Freq: Four times a day (QID) | INTRAMUSCULAR | Status: DC | PRN

## 2018-10-30 MED ORDER — SODIUM CHLORIDE 0.9 % IV SOLN: INTRAVENOUS | Status: DC

## 2018-10-30 MED ORDER — POLYETHYLENE GLYCOL 3350 17 G PO PACK: 17.0000 g | PACK | Freq: Every day | ORAL | Status: DC | PRN

## 2018-10-30 MED ORDER — GLYCOPYRROLATE PF 0.2 MG/ML IJ SOSY
PREFILLED_SYRINGE | INTRAMUSCULAR | Status: AC
  Filled 2018-10-30: qty 1

## 2018-10-30 MED ORDER — MENTHOL 3 MG MT LOZG: 1.0000 | LOZENGE | OROMUCOSAL | Status: DC | PRN

## 2018-10-30 MED ORDER — BUPIVACAINE HCL (PF) 0.25 % IJ SOLN
INTRAMUSCULAR | Status: AC
  Filled 2018-10-30: qty 30

## 2018-10-30 MED ORDER — OXYCODONE HCL 5 MG PO TABS
ORAL_TABLET | ORAL | Status: AC
  Filled 2018-10-30: qty 1

## 2018-10-30 MED ORDER — PROPOFOL 10 MG/ML IV BOLUS
INTRAVENOUS | Status: AC
  Filled 2018-10-30: qty 20

## 2018-10-30 MED ORDER — MIDAZOLAM HCL 2 MG/2ML IJ SOLN
INTRAMUSCULAR | Status: AC
  Filled 2018-10-30: qty 2

## 2018-10-30 MED ORDER — HYDROMORPHONE HCL 1 MG/ML IJ SOLN
0.5000 mg | INTRAMUSCULAR | Status: DC | PRN
  Administered 2018-10-30: 23:00:00 0.5 mg via INTRAVENOUS
  Filled 2018-10-30: qty 0.5

## 2018-10-30 MED ORDER — THROMBIN (RECOMBINANT) 20000 UNITS EX SOLR
CUTANEOUS | Status: AC
  Filled 2018-10-30: qty 20000

## 2018-10-30 MED ORDER — THROMBIN (RECOMBINANT) 5000 UNITS EX SOLR
CUTANEOUS | Status: AC
  Filled 2018-10-30: qty 5000

## 2018-10-30 MED ORDER — THROMBIN 20000 UNITS EX KIT
PACK | CUTANEOUS | Status: AC
  Filled 2018-10-30: qty 1

## 2018-10-30 MED ORDER — LIDOCAINE 2% (20 MG/ML) 5 ML SYRINGE
INTRAMUSCULAR | Status: AC
  Filled 2018-10-30: qty 5

## 2018-10-30 MED ORDER — SUGAMMADEX SODIUM 200 MG/2ML IV SOLN
INTRAVENOUS | Status: DC | PRN
  Administered 2018-10-30: 300 mg via INTRAVENOUS

## 2018-10-30 SURGICAL SUPPLY — 40 items
BUR ROUND FLUTED 4 SOFT TCH (BURR) IMPLANT
CANISTER SUCT 3000ML PPV (MISCELLANEOUS) ×2 IMPLANT
COVER SURGICAL LIGHT HANDLE (MISCELLANEOUS) ×2 IMPLANT
COVER WAND RF STERILE (DRAPES) ×2 IMPLANT
DERMABOND ADVANCED (GAUZE/BANDAGES/DRESSINGS) ×1
DERMABOND ADVANCED .7 DNX12 (GAUZE/BANDAGES/DRESSINGS) ×1 IMPLANT
DRAPE MICROSCOPE LEICA (MISCELLANEOUS) ×2 IMPLANT
DRSG MEPILEX BORDER 4X4 (GAUZE/BANDAGES/DRESSINGS) IMPLANT
DRSG MEPILEX BORDER 4X8 (GAUZE/BANDAGES/DRESSINGS) ×2 IMPLANT
DURAPREP 26ML APPLICATOR (WOUND CARE) ×2 IMPLANT
DURASEAL SPINE SEALANT 3ML (MISCELLANEOUS) IMPLANT
ELECT REM PT RETURN 9FT ADLT (ELECTROSURGICAL) ×2
ELECTRODE REM PT RTRN 9FT ADLT (ELECTROSURGICAL) ×1 IMPLANT
GAUZE SPONGE 4X4 12PLY STRL (GAUZE/BANDAGES/DRESSINGS) ×2 IMPLANT
GLOVE BIOGEL PI IND STRL 8 (GLOVE) ×2 IMPLANT
GLOVE BIOGEL PI INDICATOR 8 (GLOVE) ×2
GLOVE ORTHO TXT STRL SZ7.5 (GLOVE) ×4 IMPLANT
GOWN STRL REUS W/ TWL LRG LVL3 (GOWN DISPOSABLE) ×1 IMPLANT
GOWN STRL REUS W/ TWL XL LVL3 (GOWN DISPOSABLE) ×1 IMPLANT
GOWN STRL REUS W/TWL 2XL LVL3 (GOWN DISPOSABLE) ×2 IMPLANT
GOWN STRL REUS W/TWL LRG LVL3 (GOWN DISPOSABLE) ×1
GOWN STRL REUS W/TWL XL LVL3 (GOWN DISPOSABLE) ×1
KIT BASIN OR (CUSTOM PROCEDURE TRAY) ×2 IMPLANT
KIT TURNOVER KIT B (KITS) ×2 IMPLANT
NEEDLE SPNL 18GX3.5 QUINCKE PK (NEEDLE) ×2 IMPLANT
NS IRRIG 1000ML POUR BTL (IV SOLUTION) ×2 IMPLANT
PACK LAMINECTOMY ORTHO (CUSTOM PROCEDURE TRAY) ×2 IMPLANT
PAD ARMBOARD 7.5X6 YLW CONV (MISCELLANEOUS) ×4 IMPLANT
PATTIES SURGICAL .5 X.5 (GAUZE/BANDAGES/DRESSINGS) IMPLANT
PATTIES SURGICAL .75X.75 (GAUZE/BANDAGES/DRESSINGS) IMPLANT
SURGIFLO W/THROMBIN 8M KIT (HEMOSTASIS) ×2 IMPLANT
SUT VIC AB 1 CTX 36 (SUTURE) ×1
SUT VIC AB 1 CTX36XBRD ANBCTR (SUTURE) ×1 IMPLANT
SUT VIC AB 2-0 CT1 27 (SUTURE) ×1
SUT VIC AB 2-0 CT1 TAPERPNT 27 (SUTURE) ×1 IMPLANT
SUT VIC AB 3-0 X1 27 (SUTURE) IMPLANT
SYR 20ML ECCENTRIC (SYRINGE) IMPLANT
TOWEL GREEN STERILE (TOWEL DISPOSABLE) ×2 IMPLANT
TOWEL GREEN STERILE FF (TOWEL DISPOSABLE) ×2 IMPLANT
WATER STERILE IRR 1000ML POUR (IV SOLUTION) ×2 IMPLANT

## 2018-10-30 NOTE — Anesthesia Procedure Notes (Signed)
Procedure Name: Intubation Date/Time: 10/30/2018 4:01 PM Performed by: Orlie Dakin, CRNA Pre-anesthesia Checklist: Patient identified, Emergency Drugs available, Suction available and Patient being monitored Patient Re-evaluated:Patient Re-evaluated prior to induction Oxygen Delivery Method: Circle system utilized Preoxygenation: Pre-oxygenation with 100% oxygen Induction Type: IV induction Ventilation: Oral airway inserted - appropriate to patient size Laryngoscope Size: Sabra Heck and 3 Grade View: Grade I Tube type: Oral Tube size: 7.0 mm Number of attempts: 1 Airway Equipment and Method: Stylet Placement Confirmation: ETT inserted through vocal cords under direct vision,  positive ETCO2 and breath sounds checked- equal and bilateral Secured at: 23 cm Tube secured with: Tape Dental Injury: Teeth and Oropharynx as per pre-operative assessment  Comments: 4x4s bite block used.

## 2018-10-30 NOTE — Anesthesia Postprocedure Evaluation (Signed)
Anesthesia Post Note  Patient: FAHIMA CIFELLI  Procedure(s) Performed: left L3 & L4 hemilaminectomy, removal of herniated nucleus pulposus (N/A )     Patient location during evaluation: PACU Anesthesia Type: General Level of consciousness: awake and alert Pain management: pain level controlled Vital Signs Assessment: post-procedure vital signs reviewed and stable Respiratory status: spontaneous breathing, nonlabored ventilation, respiratory function stable and patient connected to nasal cannula oxygen Cardiovascular status: blood pressure returned to baseline and stable Postop Assessment: no apparent nausea or vomiting Anesthetic complications: no    Last Vitals:  Vitals:   10/30/18 1320 10/30/18 1807  BP: (!) 169/96 134/77  Pulse: 85 86  Resp:  (!) 30  Temp:  36.4 C  SpO2:  96%    Last Pain:  Vitals:   10/30/18 1807  TempSrc:   PainSc: Asleep                 Jasyn Mey

## 2018-10-30 NOTE — Anesthesia Preprocedure Evaluation (Addendum)
Anesthesia Evaluation  Patient identified by MRN, date of birth, ID band Patient awake    Reviewed: Allergy & Precautions, NPO status , Patient's Chart, lab work & pertinent test results  History of Anesthesia Complications Negative for: history of anesthetic complications  Airway Mallampati: II  TM Distance: >3 FB Neck ROM: Full    Dental  (+) Dental Advisory Given   Pulmonary COPD, Current Smoker,  10/28/2018 SARS coronavirus NEG   breath sounds clear to auscultation       Cardiovascular (-) anginanegative cardio ROS   Rhythm:Regular Rate:Normal     Neuro/Psych Anxiety Depression Chronic back pain: narcotics    GI/Hepatic negative GI ROS, (+)     substance abuse  ,   Endo/Other  Morbid obesity  Renal/GU negative Renal ROS     Musculoskeletal  (+) Arthritis , narcotic dependent  Abdominal (+) + obese,   Peds  Hematology negative hematology ROS (+)   Anesthesia Other Findings   Reproductive/Obstetrics                            Anesthesia Physical Anesthesia Plan  ASA: III  Anesthesia Plan: General   Post-op Pain Management:    Induction: Intravenous  PONV Risk Score and Plan: 3 and Ondansetron and Dexamethasone  Airway Management Planned: Oral ETT  Additional Equipment:   Intra-op Plan:   Post-operative Plan: Extubation in OR  Informed Consent: I have reviewed the patients History and Physical, chart, labs and discussed the procedure including the risks, benefits and alternatives for the proposed anesthesia with the patient or authorized representative who has indicated his/her understanding and acceptance.     Dental advisory given  Plan Discussed with: CRNA and Surgeon  Anesthesia Plan Comments:         Anesthesia Quick Evaluation

## 2018-10-30 NOTE — Op Note (Signed)
Preop diagnosis: Left L4-5 HNP with large migrated cephalad free fragment and radiculopathy.  Postop diagnosis: Same  Procedure: Left L4 hemilaminectomy, microdiscectomy and removal of large free fragment.  Surgeon: Rodell Perna, MD  Assistant: Benjiman Core, PA-C medically necessary and present for the entire procedure  Anesthesia: General oral tracheal.  Plus Marcaine local  Findings: Large migrated free fragment from L4-5 left lateral gutter migrated cephalad almost to the L3-4 disc space level.  Procedure: After induction general anesthesia orotracheal ovation patient placed prone careful padding and positioning calf pumpers preoperative antibiotics timeout procedure after DuraPrep scoring the area with towel sterile skin marker.  Patient had previous surgery by Dr. Maryjean Ka in the 1980s at the L5-S1 level.  Spinal needles were placed at the level of the L4-5 disc and a second needle expected to be just barely below the L3-4 level confirmed with lateral x-ray images sterilely draped.  Incision was made 2 mm to the left of the midline subperiosteal dissection out to the lamina was performed and then [place] 4 and a Soil scientist were placed down at the base with the first 1 just below the L4 lamina on the left and the other at the top of the lamina confirming were at the appropriate level confirmed with lateral x-ray.  Lamina was thinned with the bur removed with the Kerrison rondure thick chunks of ligament was removed and there was hypertrophic ligamentum in the lateral gutter in combination with what appeared to be a facet cyst with some hemosiderin staining.  There was partial calcification of the disc fragment and it was encapsulated on the floor of the canal and extended along the left lateral gutter halfway up the pedicle.  Bipolar cautery was used for veins as well as Gelfoam and some Surgi-Flo.  Epidural veins were coagulated.  Passes were made through the disc removing a minimal amount of  disc material present.  Large free fragment was carefully teased out of the pocket and the capsule was removed where there was a free fragment exposing the bone of the canal on the floor.  Ball-tipped nerve hook was used to tease fragments down and remove them with a micropituitary.  1 very large chunk was removed which decompressed the dura centrally and then passes with the [facility] showed there was no remaining fragments.  Surgi-Flo was placed in the base with some patties waited a few minutes.  Epidural space was dry and closure of the fascia with #1 Vicryl 2-0 Vicryl subtenons tissue skin staple closure postop dressing and transferred recovery room in stable condition.  Patient tolerated procedure well and was transferred recovery room.

## 2018-10-30 NOTE — Interval H&P Note (Signed)
History and Physical Interval Note:  10/30/2018 3:06 PM  Emily Bender  has presented today for surgery, with the diagnosis of left L4-5 herniated nucleus pulposus with radiculopathy.  The various methods of treatment have been discussed with the patient and family. After consideration of risks, benefits and other options for treatment, the patient has consented to  Procedure(s): left L3 & L4 hemilaminectomy, removal of herniated nucleus pulposus (N/A) as a surgical intervention.  The patient's history has been reviewed, patient examined, no change in status, stable for surgery.  I have reviewed the patient's chart and labs.  Questions were answered to the patient's satisfaction.     Marybelle Killings

## 2018-10-30 NOTE — H&P (Signed)
Patient: Emily Bender                                              Date of Birth: [birth year]                                                     MRN: [id number]  Visit Date: 10/15/2018                                                                       Requested by: Margo Common, [address]  [place],  [place] [id number]  PCP: Margo Common, [place]        Assessment & Plan:  Visit Diagnoses:    1.   Lumbar herniated disc     2.   Spinal stenosis of lumbar region without neurogenic claudication          Plan: We reviewed MRI scan as well as plain radiographs with patient.  She has facet degenerative changes L4-5 with anterolisthesis and plan would be left L4 and L3 hemilaminectomy with removal of migrated free fragment which is large and migrated up behind the L4 body almost up to the L3-4 disc space.  She has had problems with spinal stenosis and degenerative changes she understands that she may require fusion in the future.  Ligament lamina on the right side would give her some stability with her anterolisthesis.  She understands if she begins shift forward then instrumented fusion would be required.  She has degenerative changes at other levels with L2-3 slight retrolisthesis with broad-based subligamentous disc extrusion without severe compression.  L3-4 minimal retrolisthesis and also marked disc space narrowing at L5-S1 with some soft tissue in the lateral recess which may be scar tissue or some small amount of recurrent disc from her previous surgery.  There is moderate foraminal stenosis at L5-S1 and as I discussed with her a single level fusion at L4-5 would load all these other levels that already have degenerative changes.  Procedure discussed questions elicited and answered plan to be overnight stay.  Risks of dural tear recurrent disc herniation progressive degeneration requiring fusion all discussed..  Questions were elicited and  answered she understands request to proceed.     Follow-Up Instructions: No follow-ups on file.      Orders:   No orders of the defined types were placed in this encounter.    No orders of the defined types were placed in this encounter.           Procedures:  No procedures performed        Clinical Data:  No additional findings.        Subjective:      Chief Complaint    Patient presents with    .   Lower Back - Pain  HPI 53 year old female referred to me by Dr. Ninfa Linden for problems with severe back pain left leg pain inability to stand upright now for 8 weeks.  She has had a known diagnosis of spinal stenosis has had multiple epidural injections in the past with periodic flare of of her back.  States she was swinging golf club at work suddenly had excruciating pain radiated from her back into her leg inability to get upright and she has been walking with both hands on her knees now for 6 weeks.  She is been on prednisone Dosepak only take hydrocodone having trouble sleeping she is used a TENS unit home exercise program.  She has some degenerative anterolisthesis at L4-5 with intact pars.  MRI scan 10/09/2018 showed severe spinal stenosis multifactorial at L4-5 with extruded disc fragment from L4-5 migrated superiorly behind the body of L3 all the way to the L3-4 disc space.  Patient also had some abnormal soft tissue density in the left lateral recess at L5-S1 where patient had previous surgery by Dr. Maryjean Ka in the early 1980s.  Patient's pain is radiated from her back and her buttocks into her posterior lateral thigh stops just below the knee.  No pain down to the foot.  She is noted giving way weakness of her quadriceps on the left none on the right.  She is grabbed multiple objects and fallen against some but is not fallen and landed on the floor.  Patient states the pain is 10 out of 10 and is unbearable.  Patient's been treated by chiropractor  also physical therapy without relief.  Past episodes when she had back pain after period of several days or week she got resolution of symptoms.  Patient has been on meloxicam, tramadol, gabapentin, prednisone pack without relief.     Review of Systems no history of diabetes patient does use alcohol.  Previous left L5-S1 microdiscectomy early 1980s by Dr. Clydell Hakim.  Positive anxiety and depression, hyperlipidemia, tobacco use.  Spinal stenosis and currently HNP L4-5 with left radiculopathy.  History of wrist fracture.  Right ankle arthroplasty done at [facility] 2015.        Objective:  Vital Signs: Ht 5\' 7"  (1.702 m)   Wt 219 lb (99.3 kg)   BMI 34.30 kg/m      Physical Exam  Constitutional:       Appearance: She is well-developed.     Comments: Patient in obvious distress sitting she leans to the right side cannot sit flat unable to stand up.  HENT:      Head: Normocephalic.     Right Ear: External ear normal.     Left Ear: External ear normal.  Eyes:      Pupils: Pupils are equal, round, and reactive to light.  Neck:      Thyroid: No thyromegaly.     Trachea: No tracheal deviation.  Cardiovascular:      Rate and Rhythm: Normal rate.  Pulmonary:      Effort: Pulmonary effort is normal.  Abdominal:     Palpations: Abdomen is soft.   Skin:     General: Skin is warm and dry.  Neurological:      Mental Status: She is alert and oriented to person, place, and time.  Psychiatric:        Behavior: Behavior normal.            Ortho Exam patient has straight and straight leg raising 70 degrees on the left negative on the right.  Distal pulses are 2+ and palpable.  Quad weakness on the left 1 grade.  She can only stand keeping her hands on her knees cannot extend at the hips done upright position and walks with both hands on her knees.  Anterior tib gastrocsoleus is strong and symmetrical with resisted manual testing.  Abductor to the hips are normal quad strength on  the right is normal significant quad weakness on the left.  Due to pain she is unable able to heel or toe walk.  Decreased sensation lateral thigh that extends just below the knee.  Patient is unable to stand any left hip extension with severe radicular pain into her thigh.  Negative straight leg raising on the right leg.     Specialty Comments:   No specialty comments available.     Imaging:  No results found.  untitled image     PMFS History:       Patient Active Problem List        Diagnosis   Date Noted    .   Spinal stenosis of lumbar region   10/16/2018    .   Lumbar herniated disc   10/14/2018    .   Alcohol drinker   05/20/2015    .   Anxiety and depression   05/20/2015    .   History of artificial joint   05/20/2015    .   HLD (hyperlipidemia)   05/20/2015    .   Avitaminosis D   05/20/2015    .   Chronic cough   01/20/2014    .   Adiposity   01/18/2012    .   Irregular bleeding   11/27/2006    .   Tobacco use   06/23/2005           Past Medical History:    Diagnosis   Date    .   Anxiety        .   Depression        .   Hyperlipidemia                 Family History    Problem   Relation   Age of Onset    .   Lung cancer   Father        .   Healthy   Brother        .   Aneurysm   Maternal Grandmother        .   Lung cancer   Maternal Grandfather        .   Coronary artery disease   Paternal Grandmother        .   Diabetes   Mother        .   Hypertension   Mother                 Past Surgical History:    Procedure   Laterality   Date    .   ANKLE SURGERY       01/17/2012    .   BREAST BIOPSY   Left   1990's    .   CHOLECYSTECTOMY       2000    .   LAMINECTOMY       1996       Social History             Occupational History    .    Not on  file    Tobacco Use    .   Smoking status:   Current Every Day Smoker            Packs/day:   1.00            Years:   20.00            Pack years:   20.00            Types:   Cigarettes    .   Smokeless tobacco:   Never Used    Substance and Sexual Activity    .   Alcohol use:   Yes            Alcohol/week:   0.0 standard drinks            Comment: moderate use- drinks 4-6 beers a day    .   Drug use:   Yes            Comment: uses recreational drugs    .   Sexual activity:   Not on file                          Electronically signed by Marybelle Killings, MD at 10/16/2018  8:19 AM

## 2018-10-30 NOTE — Transfer of Care (Signed)
Immediate Anesthesia Transfer of Care Note  Patient: Emily Bender  Procedure(s) Performed: left L3 & L4 hemilaminectomy, removal of herniated nucleus pulposus (N/A )  Patient Location: PACU  Anesthesia Type:General  Level of Consciousness: sedated and drowsy  Airway & Oxygen Therapy: Patient Spontanous Breathing and Patient connected to face mask oxygen  Post-op Assessment: Report given to RN and Post -op Vital signs reviewed and stable  Post vital signs: Reviewed and stable  Last Vitals:  Vitals Value Taken Time  BP 134/77 10/30/18 1807  Temp    Pulse 83 10/30/18 1808  Resp 30 10/30/18 1808  SpO2 96 % 10/30/18 1808  Vitals shown include unvalidated device data.  Last Pain:  Vitals:   10/30/18 1320  TempSrc:   PainSc: 6          Complications: No apparent anesthesia complications

## 2018-10-31 ENCOUNTER — Encounter (HOSPITAL_COMMUNITY): Payer: Self-pay | Admitting: Orthopaedic Surgery

## 2018-10-31 DIAGNOSIS — M1990 Unspecified osteoarthritis, unspecified site: Secondary | ICD-10-CM | POA: Diagnosis not present

## 2018-10-31 DIAGNOSIS — M5116 Intervertebral disc disorders with radiculopathy, lumbar region: Secondary | ICD-10-CM | POA: Diagnosis not present

## 2018-10-31 DIAGNOSIS — M48061 Spinal stenosis, lumbar region without neurogenic claudication: Secondary | ICD-10-CM | POA: Diagnosis not present

## 2018-10-31 DIAGNOSIS — Z6835 Body mass index (BMI) 35.0-35.9, adult: Secondary | ICD-10-CM | POA: Diagnosis not present

## 2018-10-31 DIAGNOSIS — M4316 Spondylolisthesis, lumbar region: Secondary | ICD-10-CM | POA: Diagnosis not present

## 2018-10-31 DIAGNOSIS — F17210 Nicotine dependence, cigarettes, uncomplicated: Secondary | ICD-10-CM | POA: Diagnosis not present

## 2018-10-31 DIAGNOSIS — Z882 Allergy status to sulfonamides status: Secondary | ICD-10-CM | POA: Diagnosis not present

## 2018-10-31 DIAGNOSIS — F419 Anxiety disorder, unspecified: Secondary | ICD-10-CM | POA: Diagnosis not present

## 2018-10-31 DIAGNOSIS — F329 Major depressive disorder, single episode, unspecified: Secondary | ICD-10-CM | POA: Diagnosis not present

## 2018-10-31 DIAGNOSIS — E559 Vitamin D deficiency, unspecified: Secondary | ICD-10-CM | POA: Diagnosis not present

## 2018-10-31 DIAGNOSIS — E785 Hyperlipidemia, unspecified: Secondary | ICD-10-CM | POA: Diagnosis not present

## 2018-10-31 MED ORDER — OXYCODONE-ACETAMINOPHEN 5-325 MG PO TABS: 1.0000 | ORAL_TABLET | ORAL | 0 refills | Status: DC | PRN

## 2018-10-31 NOTE — Evaluation (Signed)
Physical Therapy Evaluation Patient Details Name: Emily Bender MRN: [id number] DOB: [birth year] Today's Date: 10/31/2018   History of Present Illness  s/p left L3-4 hemilaminectomy, microdiscectomy and removal of HNP. PMH; COPD, anxiety, depression, narcotic dependence, obesity, R wrist ORIF, R ankle total arthroplasty.    Clinical Impression  Patient evaluated by Physical Therapy with no further acute PT needs identified. All education has been completed and the patient has no further questions. Patient ambulating unit and stairs without external assistance. Mild unsteady but no overt LOB or need today for RW. Reviewed precautions  and log roll, pt demonstrating good technique. See below for any follow-up Physical Therapy or equipment needs. PT is signing off. Thank you for this referral.      Follow Up Recommendations Follow surgeon's recommendation for DC plan and follow-up therapies;No PT follow up    Equipment Recommendations  None recommended by PT    Recommendations for Other Services       Precautions / Restrictions Precautions Precautions: Back Precaution Booklet Issued: Yes (comment) Precaution Comments: reviewed back precautions with pt      Mobility  Bed Mobility Overal bed mobility: Modified Independent             General bed mobility comments: used rail and log roll technique  Transfers Overall transfer level: Modified independent Equipment used: None             General transfer comment: somewhat slow to rise, but steady  Ambulation/Gait Ambulation/Gait assistance: Modified independent (Device/Increase time) Gait Distance (Feet): 50 Feet Assistive device: None Gait Pattern/deviations: Step-to pattern Gait velocity: decreased   General Gait Details: slow but steady gait, no LOB  Stairs Stairs: Yes Stairs assistance: Supervision Stair Management: No rails Number of Stairs: 4 General stair comments: step to pattern. cues for sequencing.    Wheelchair Mobility    Modified Rankin (Stroke Patients Only)       Balance Overall balance assessment: No apparent balance deficits (not formally assessed)                                           Pertinent Vitals/Pain Pain Assessment: Faces Faces Pain Scale: Hurts a little bit Pain Location: incision Pain Descriptors / Indicators: Operative site guarding Pain Intervention(s): Limited activity within patient's tolerance;Monitored during session    Home Living Family/patient expects to be discharged to:: Private residence Living Arrangements: Parent;Children Available Help at Discharge: Family;Available 24 hours/day Type of Home: House Home Access: Stairs to enter Entrance Stairs-Rails: None Entrance Stairs-Number of Steps: 3 Home Layout: One level Home Equipment: Environmental consultant - 2 wheels;Crutches      Prior Function Level of Independence: Independent         Comments: pt works as a Counsellor, 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     Hand Dominance   Dominant Hand: Right    Extremity/Trunk Assessment   Upper Extremity Assessment Upper Extremity Assessment: Overall WFL for tasks assessed    Lower Extremity Assessment Lower Extremity Assessment: Overall WFL for tasks assessed       Communication   Communication: No difficulties  Cognition Arousal/Alertness: Awake/alert Behavior During Therapy: WFL for tasks assessed/performed Overall Cognitive Status: Within Functional Limits for tasks assessed  General Comments      Exercises     Assessment/Plan    PT Assessment Patient needs continued PT services  PT Problem List Decreased strength       PT Treatment Interventions      PT Goals (Current goals can be found in the Care Plan section)  Acute Rehab PT Goals Patient Stated Goal: to return home PT Goal Formulation: With patient Potential to Achieve Goals: Good    Frequency      Barriers to discharge        Co-evaluation               AM-PAC PT "6 Clicks" Mobility  Outcome Measure Help needed turning from your back to your side while in a flat bed without using bedrails?: None Help needed moving from lying on your back to sitting on the side of a flat bed without using bedrails?: None Help needed moving to and from a bed to a chair (including a wheelchair)?: None Help needed standing up from a chair using your arms (e.g., wheelchair or bedside chair)?: None Help needed to walk in hospital room?: None Help needed climbing 3-5 steps with a railing? : None 6 Click Score: 24    End of Session Equipment Utilized During Treatment: Gait belt Activity Tolerance: Patient tolerated treatment well Patient left: in bed Nurse Communication: Mobility status PT Visit Diagnosis: Unsteadiness on feet (R26.81)    Time: 2620-3559 PT Time Calculation (min) (ACUTE ONLY): 12 min   Charges:   PT Evaluation $PT Eval Low Complexity: 1 Low         Reinaldo Berber, PT, DPT Acute Rehabilitation Services Pager: [phone] Office: [phone]   Reinaldo Berber 10/31/2018, 10:08 AM

## 2018-10-31 NOTE — Progress Notes (Signed)
Pt doing well. Pt given D/C instructions with verbal understanding was provided. Pt's incision is clean and dry with no sign of infection. Pt's IV was removed prior to D/C. Dressing was changed per MD order. Pt is stable @ D/C and has no other needs at this time. Holli Humbles, RN

## 2018-10-31 NOTE — Progress Notes (Signed)
   Subjective: 1 Day Post-Op Procedure(s) (LRB): left L3 & L4 hemilaminectomy, removal of herniated nucleus pulposus (N/A) Patient reports pain as mild and moderate.  Incisional pain. Leg feels good.   Objective: Vital signs in last 24 hours: Temp:  [97.6 F (36.4 C)-98.2 F (36.8 C)] 97.7 F (36.5 C) (07/16 0420) Pulse Rate:  [73-86] 75 (07/16 0420) Resp:  [15-30] 18 (07/16 0420) BP: (134-198)/(77-99) 151/96 (07/16 0420) SpO2:  [92 %-97 %] 95 % (07/16 0420) Weight:  [103.4 kg] 103.4 kg (07/15 1301)  Intake/Output from previous day: 07/15 0701 - 07/16 0700 In: 1250 [I.V.:1200; IV Piggyback:50] Out: 100 [Blood:100] Intake/Output this shift: No intake/output data recorded.  No results for input(s): HGB in the last 72 hours. No results for input(s): WBC, RBC, HCT, PLT in the last 72 hours. No results for input(s): NA, K, CL, CO2, BUN, CREATININE, GLUCOSE, CALCIUM in the last 72 hours. No results for input(s): LABPT, INR in the last 72 hours.  Neurologically intact Dg Lumbar Spine 2-3 Views  Result Date: 10/30/2018 CLINICAL DATA:  L3 and L4 hemilaminectomy EXAM: LUMBAR SPINE - 2-3 VIEW COMPARISON:  None. FINDINGS: Transitional anatomy at the lumbosacral junction. Numbering follows the prior numbering on MRI. First lateral intraoperative image demonstrates posterior needles are directed at the L4 pedicles and inferior L4 vertebral body. Second image demonstrates posterior surgical instruments directed at the L4 and L5 vertebral bodies. : Intraoperative localization as above. Electronically Signed   By: Rolm Baptise M.D.   On: 10/30/2018 18:00    Assessment/Plan: 1 Day Post-Op Procedure(s) (LRB): left L3 & L4 hemilaminectomy, removal of herniated nucleus pulposus (N/A) Plan: saline lock IV , discharge home. Dressing change.  Marybelle Killings 10/31/2018, 7:53 AM

## 2018-10-31 NOTE — Evaluation (Signed)
Occupational Therapy Evaluation and Discharge Patient Details Name: Emily Bender MRN: [id number] DOB: [birth year] Today's Date: 10/31/2018    History of Present Illness s/p left L3-4 hemilaminectomy, microdiscectomy and removal of HNP. PMH; COPD, anxiety, depression, narcotic dependence, obesity, R wrist ORIF, R ankle total arthroplasty.   Clinical Impression   Pt is functioning modified independently in ADL and ADL transfers. Recommended long handled bath sponge. Pt educated at length in back precautions related to ADL and IADL. No further OT needs.   Follow Up Recommendations  No OT follow up    Equipment Recommendations  None recommended by OT    Recommendations for Other Services       Precautions / Restrictions Precautions Precautions: Back Precaution Booklet Issued: Yes (comment) Precaution Comments: reviewed back precautions with pt      Mobility Bed Mobility Overal bed mobility: Modified Independent             General bed mobility comments: used rail and log roll technique  Transfers Overall transfer level: Modified independent Equipment used: None             General transfer comment: somewhat slow to rise, but steady    Balance Overall balance assessment: No apparent balance deficits (not formally assessed)                                         ADL either performed or assessed with clinical judgement   ADL Overall ADL's : Modified independent                                       General ADL Comments: pt educated in back precautions in ADL and IADL, compensatory strategies, limiting sitting, safe footwear and fall prevention     Vision Baseline Vision/History: Wears glasses Wears Glasses: At all times Patient Visual Report: No change from baseline       Perception     Praxis      Pertinent Vitals/Pain Pain Assessment: Faces Faces Pain Scale: Hurts a little bit Pain Location: incision Pain  Descriptors / Indicators: Operative site guarding Pain Intervention(s): Monitored during session     Hand Dominance Right   Extremity/Trunk Assessment Upper Extremity Assessment Upper Extremity Assessment: Overall WFL for tasks assessed   Lower Extremity Assessment Lower Extremity Assessment: Defer to PT evaluation       Communication Communication Communication: No difficulties   Cognition Arousal/Alertness: Awake/alert Behavior During Therapy: WFL for tasks assessed/performed Overall Cognitive Status: Within Functional Limits for tasks assessed                                     General Comments       Exercises     Shoulder Instructions      Home Living Family/patient expects to be discharged to:: Private residence Living Arrangements: Parent;Children Available Help at Discharge: Family;Available 24 hours/day Type of Home: House Home Access: Stairs to enter [employer] of Steps: 3 Entrance Stairs-Rails: None Home Layout: One level     Bathroom Shower/Tub: Occupational psychologist: Handicapped height     Home Equipment: Environmental consultant - 2 wheels;Crutches          Prior Functioning/Environment Level of Independence: Independent  Comments: pt works as a Counsellor, 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        OT Problem List:        OT Treatment/Interventions:      OT Goals(Current goals can be found in the care plan section) Acute Rehab OT Goals Patient Stated Goal: to return home  OT Frequency:     Barriers to D/C:            Co-evaluation              AM-PAC OT "6 Clicks" Daily Activity     Outcome Measure Help from another person eating meals?: None Help from another person taking care of personal grooming?: None Help from another person toileting, which includes using toliet, bedpan, or urinal?: None Help from another person bathing (including washing, rinsing, drying)?: None Help from another person to  put on and taking off regular upper body clothing?: None Help from another person to put on and taking off regular lower body clothing?: None 6 Click Score: 24   End of Session    Activity Tolerance: Patient tolerated treatment well Patient left: in chair;with call bell/phone within reach  OT Visit Diagnosis: Pain                Time: 3005-1102 OT Time Calculation (min): 19 min Charges:  OT General Charges $OT Visit: 1 Visit OT Evaluation $OT Eval Low Complexity: 1 Low  Nestor Lewandowsky, OTR/L Acute Rehabilitation Services Pager: [phone] Office: [phone] Malka So 10/31/2018, 9:09 AM

## 2018-10-31 NOTE — Discharge Instructions (Signed)
Walk daily, avoid bending and lifting. OK to shower with your dressing on, it is waterproof. See Dr. Lorin Bender in one week   Laminectomy, Care After This sheet gives you information about how to care for yourself after your procedure. Your health care provider may also give you more specific instructions. If you have problems or questions, contact your health care provider. What can I expect after the procedure? After the procedure, it is common to have:  Some pain around your incision area.  Muscle tightening (spasms) across the back. Follow these instructions at home: Incision care   Follow instructions from your health care provider about how to take care of your incision area. Make sure you: ? Wash your hands with soap and water before and after you apply medicine to the area or change your bandage (dressing). If soap and water are not available, use hand sanitizer. ? Change your dressing as told by your health care provider. ? Leave stitches (sutures), skin glue, or adhesive strips in place. These skin closures may need to stay in place for 2 weeks or longer. If adhesive strip edges start to loosen and curl up, you may trim the loose edges. Do not remove adhesive strips completely unless your health care provider tells you to do that.  Check your incision area every day for signs of infection. Check for: ? More redness, swelling, or pain. ? More fluid or blood. ? Warmth. ? Pus or a bad smell. Medicines  Take over-the-counter and prescription medicines only as told by your health care provider.  If you were prescribed an antibiotic medicine, use it as told by your health care provider. Do not stop using the antibiotic even if you start to feel better. Bathing  Do not take baths, swim, or use a hot tub for 2 weeks, or until your incision has healed completely.  If your health care provider approves, you may take showers after your dressing has been removed. Activity   Return to  your normal activities as told by your health care provider. Ask your health care provider what activities are safe for you.  Avoid bending or twisting at your waist. Always bend at your knees.  Do not sit for more than 20-30 minutes at a time. Lie down or walk between periods of sitting.  Do not lift anything that is heavier than 10 lb (4.5 kg) or the limit that your health care provider tells you, until he or she says that it is safe.  Do not drive for 2 weeks after your procedure or for as long as your health care provider tells you.  Do not drive or use heavy machinery while taking prescription pain medicine. General instructions  To prevent or treat constipation while you are taking prescription pain medicine, your health care provider may recommend that you: ? Drink enough fluid to keep your urine clear or pale yellow. ? Take over-the-counter or prescription medicines. ? Eat foods that are high in fiber, such as fresh fruits and vegetables, whole grains, and beans. ? Limit foods that are high in fat and processed sugars, such as fried and sweet foods.  Do breathing exercises as told.  Keep all follow-up visits as told by your health care provider. This is important. Contact a health care provider if:  You have more redness, swelling, or pain around your incision area.  Your incision feels warm to the touch.  You are not able to return to activities or do exercises as told by  your health care provider. Get help right away if:  You have: ? More fluid or blood coming from your incision area. ? Pus or a bad smell coming from your incision area. ? Chills or a fever. ? Episodes of dizziness or fainting while standing.  You develop a rash.  You develop shortness of breath or you have difficulty breathing.  You cannot control when you urinate or have a bowel movement.  You become weak.  You are not able to use your legs. Summary  After the procedure, it is common to have  some pain around your incision area. You may also have muscle tightening (spasms) across the back.  Follow instructions from your health care provider about how to care for your incision.  Do not lift anything that is heavier than 10 lb (4.5 kg) or the limit that your health care provider tells you, until he or she says that it is safe.  Contact your health care provider if you have more redness, swelling, or pain around your incision area or if your incision feels warm to the touch. These can be signs of infection. This information is not intended to replace advice given to you by your health care provider. Make sure you discuss any questions you have with your health care provider. Document Released: 10/21/2004 Document Revised: 03/16/2017 Document Reviewed: 09/19/2015 Elsevier Patient Education  2020 [employer].

## 2018-11-07 ENCOUNTER — Ambulatory Visit (INDEPENDENT_AMBULATORY_CARE_PROVIDER_SITE_OTHER): Payer: BC Managed Care – PPO | Admitting: Orthopaedic Surgery

## 2018-11-07 ENCOUNTER — Other Ambulatory Visit: Payer: Self-pay

## 2018-11-07 ENCOUNTER — Encounter: Payer: Self-pay | Admitting: Orthopaedic Surgery

## 2018-11-07 VITALS — BP 142/85 | HR 84 | Ht 67.0 in | Wt 228.0 lb

## 2018-11-07 DIAGNOSIS — M5126 Other intervertebral disc displacement, lumbar region: Secondary | ICD-10-CM

## 2018-11-07 NOTE — Progress Notes (Signed)
   Post-Op Visit Note   Patient: Emily Bender           Date of Birth: 1966-04-02           MRN: [id number] Visit Date: 11/07/2018 PCP: Margo Common, PA   Assessment & Plan: Post microdiscectomy.  Work slip given no work x5 weeks office follow-up 4 weeks.  Chief Complaint:  Chief Complaint  Patient presents with  . Lower Back - Routine Post Op   Visit Diagnoses:  1. Lumbar herniated disc     Plan: Post microdiscectomy with laminectomy patient had L4-5 HNP migrated cephalad almost to the L3-4 level.  She still has some pain anteriorly in her left groin.  Rest of her pain is been relieved.  Incision was good staples removed Steri-Strips applied.  She can use some ibuprofen for some residual discomfort.  I plan to check her back again in 1 month.  Follow-Up Instructions: Return in about 4 weeks (around 12/05/2018).   Orders:  No orders of the defined types were placed in this encounter.  No orders of the defined types were placed in this encounter.   Imaging: No results found.  PMFS History: Patient Active Problem List   Diagnosis Date Noted  . Lumbar stenosis 10/30/2018  . Herniated nucleus pulposus, lumbar 10/30/2018  . Spinal stenosis of lumbar region 10/16/2018  . Lumbar herniated disc 10/14/2018  . Alcohol drinker 05/20/2015  . Anxiety and depression 05/20/2015  . History of artificial joint 05/20/2015  . HLD (hyperlipidemia) 05/20/2015  . Avitaminosis D 05/20/2015  . Chronic cough 01/20/2014  . Adiposity 01/18/2012  . Irregular bleeding 11/27/2006  . Tobacco use 06/23/2005   Past Medical History:  Diagnosis Date  . Anxiety   . Arthritis   . Depression   . Hyperlipidemia   . Lumbar stenosis     Family History  Problem Relation Age of Onset  . Lung cancer Father   . Healthy Brother   . Aneurysm Maternal Grandmother   . Lung cancer Maternal Grandfather   . Coronary artery disease Paternal Grandmother   . Diabetes Mother   . Hypertension Mother      Past Surgical History:  Procedure Laterality Date  . ANKLE SURGERY  01/17/2012   x3  . BACK SURGERY    . BREAST BIOPSY Left 1990's  . CHOLECYSTECTOMY  2000  . LAMINECTOMY  1996  . LUMBAR LAMINECTOMY N/A 10/30/2018   Procedure: left L3 & L4 hemilaminectomy, removal of herniated nucleus pulposus;  Surgeon: Marybelle Killings, MD;  Location: [facility];  Service: Orthopedics;  Laterality: N/A;  . WRIST SURGERY Right    Social History   Occupational History  . Not on file  Tobacco Use  . Smoking status: Current Every Day Smoker    Packs/day: 1.00    Years: 20.00    Pack years: 20.00    Types: Cigarettes  . Smokeless tobacco: Never Used  Substance and Sexual Activity  . Alcohol use: Yes    Alcohol/week: 6.0 standard drinks    Types: 6 Cans of beer per week  . Drug use: Yes    Frequency: 7.0 times per week    Types: Marijuana    Comment: last use 10/22/2018  . Sexual activity: Not on file

## 2018-11-08 ENCOUNTER — Inpatient Hospital Stay: Payer: BC Managed Care – PPO | Admitting: Orthopaedic Surgery

## 2018-11-14 NOTE — Discharge Summary (Signed)
Patient ID: Emily Bender MRN: [id number] DOB/AGE: [birth year] 52 y.o.  Admit date: 10/30/2018 Discharge date: 11/14/2018  Admission Diagnoses:  Active Problems:   Lumbar stenosis   Herniated nucleus pulposus, lumbar   Discharge Diagnoses:  Active Problems:   Lumbar stenosis   Herniated nucleus pulposus, lumbar  status post Procedure(s): left L3 & L4 hemilaminectomy, removal of herniated nucleus pulposus  Past Medical History:  Diagnosis Date  . Anxiety   . Arthritis   . Depression   . Hyperlipidemia   . Lumbar stenosis     Surgeries: Procedure(s): left L3 & L4 hemilaminectomy, removal of herniated nucleus pulposus on 10/30/2018   Consultants:   Discharged Condition: Improved  Hospital Course: Emily Bender is an 53 y.o. female who was admitted 10/30/2018 for operative treatment of lumbar stenosis. Patient failed conservative treatments (please see the history and physical for the specifics) and had severe unremitting pain that affects sleep, daily activities and work/hobbies. After pre-op clearance, the patient was taken to the operating room on 10/30/2018 and underwent  Procedure(s): left L3 & L4 hemilaminectomy, removal of herniated nucleus pulposus.    Patient was given perioperative antibiotics:  Anti-infectives (From admission, onward)   Start     Dose/Rate Route Frequency Ordered Stop   10/31/18 0600  ceFAZolin (ANCEF) IVPB 2g/100 mL premix     2 g 200 mL/hr over 30 Minutes Intravenous On call to O.R. 10/30/18 1251 10/30/18 1625   10/31/18 0000  ceFAZolin (ANCEF) IVPB 1 g/50 mL premix     1 g 100 mL/hr over 30 Minutes Intravenous Every 8 hours 10/30/18 1943 10/31/18 0709       Patient was given sequential compression devices and early ambulation to prevent DVT.   Patient benefited maximally from hospital stay and there were no complications. At the time of discharge, the patient was urinating/moving their bowels without difficulty, tolerating a regular diet,  pain is controlled with oral pain medications and they have been cleared by PT/OT.   Recent vital signs: No data found.   Recent laboratory studies: No results for input(s): WBC, HGB, HCT, PLT, NA, K, CL, CO2, BUN, CREATININE, GLUCOSE, INR, CALCIUM in the last 72 hours.  Invalid input(s): PT, 2   Discharge Medications:   Allergies as of 10/31/2018      Reactions   Sulfa Antibiotics Hives      Medication List    STOP taking these medications   HYDROcodone-acetaminophen 5-325 MG tablet Commonly known as: NORCO/VICODIN   traMADol 50 MG tablet Commonly known as: ULTRAM     TAKE these medications   acetaminophen 500 MG tablet Commonly known as: TYLENOL Take 1,000 mg by mouth every 6 (six) hours as needed for moderate pain or headache.   aspirin-acetaminophen-caffeine 250-250-65 MG tablet Commonly known as: EXCEDRIN MIGRAINE Take 2 tablets by mouth every 6 (six) hours as needed for headache.   diazepam 5 MG tablet Commonly known as: VALIUM TAKE ONE TABLET BY MOUTH TWICE DAILY AS NEEDED FOR ANXIETY OR AGITATION What changed: See the new instructions.   gabapentin 300 MG capsule Commonly known as: NEURONTIN TAKE 1 CAPSULE BY MOUTH 3 TIMES DAILY What changed:   how much to take  when to take this   meloxicam 7.5 MG tablet Commonly known as: MOBIC Take 1 tablet (7.5 mg total) by mouth 2 (two) times daily. What changed:   how much to take  when to take this   methylPREDNISolone 4 MG tablet Commonly known as: Medrol Take  as directed   oxyCODONE-acetaminophen 5-325 MG tablet Commonly known as: Percocet Take 1-2 tablets by mouth every 4 (four) hours as needed for severe pain.   sertraline 100 MG tablet Commonly known as: ZOLOFT TAKE ONE TABLET BY MOUTH EVERY DAY AT BEDTIME What changed:   how much to take  how to take this  when to take this  additional instructions       Diagnostic Studies: Dg Chest 2 View  Result Date: 10/23/2018 CLINICAL DATA:   53 year old female under preoperative evaluation for left sided L3-L4 hemilaminectomy. EXAM: CHEST - 2 VIEW COMPARISON:  No priors. FINDINGS: Lung volumes are normal. No consolidative airspace disease. No pleural effusions. No pneumothorax. No pulmonary nodule or mass noted. Pulmonary vasculature and the cardiomediastinal silhouette are within normal limits. Aortic atherosclerosis. IMPRESSION: 1.  No radiographic evidence of acute cardiopulmonary disease. 2. Aortic atherosclerosis. Electronically Signed   By: Vinnie Langton M.D.   On: 10/23/2018 15:22   Dg Lumbar Spine 2-3 Views  Result Date: 10/30/2018 CLINICAL DATA:  L3 and L4 hemilaminectomy EXAM: LUMBAR SPINE - 2-3 VIEW COMPARISON:  None. FINDINGS: Transitional anatomy at the lumbosacral junction. Numbering follows the prior numbering on MRI. First lateral intraoperative image demonstrates posterior needles are directed at the L4 pedicles and inferior L4 vertebral body. Second image demonstrates posterior surgical instruments directed at the L4 and L5 vertebral bodies. : Intraoperative localization as above. Electronically Signed   By: Rolm Baptise M.D.   On: 10/30/2018 18:00      Follow-up Information    Marybelle Killings, MD Follow up in 1 week(s).   Specialty: Orthopedic Surgery Contact information: [place] [address] [phone]           Discharge Plan:  discharge to home  Disposition:     Signed: Benjiman Core  11/14/2018, 8:44 AM

## 2018-11-15 ENCOUNTER — Encounter (HOSPITAL_COMMUNITY): Payer: Self-pay | Admitting: *Deleted

## 2018-11-15 ENCOUNTER — Other Ambulatory Visit: Payer: Self-pay

## 2018-11-15 ENCOUNTER — Encounter: Payer: Self-pay | Admitting: Surgery

## 2018-11-15 ENCOUNTER — Ambulatory Visit (INDEPENDENT_AMBULATORY_CARE_PROVIDER_SITE_OTHER): Payer: BC Managed Care – PPO | Admitting: Surgery

## 2018-11-15 DIAGNOSIS — T8189XA Other complications of procedures, not elsewhere classified, initial encounter: Secondary | ICD-10-CM | POA: Diagnosis not present

## 2018-11-15 DIAGNOSIS — L539 Erythematous condition, unspecified: Secondary | ICD-10-CM | POA: Diagnosis not present

## 2018-11-15 DIAGNOSIS — T8149XA Infection following a procedure, other surgical site, initial encounter: Secondary | ICD-10-CM

## 2018-11-15 MED ORDER — OXYCODONE-ACETAMINOPHEN 5-325 MG PO TABS: 1.0000 | ORAL_TABLET | Freq: Four times a day (QID) | ORAL | 0 refills | Status: DC | PRN

## 2018-11-15 MED ORDER — DOXYCYCLINE HYCLATE 100 MG PO TABS: 100.0000 mg | ORAL_TABLET | Freq: Two times a day (BID) | ORAL | 0 refills | Status: DC

## 2018-11-15 NOTE — Progress Notes (Signed)
Spoke with pt for pre-op call. Pt had surgery here on 10/22/18. Pt states nothing has changed with her allergies, medication, medical or surgical history. When giving pre-op instructions to patient she asked if she needed to get a pre-surgery carbohydrate drink and I told her no, because Dr. Lorin Mercy has not put orders in as of yet. She stated that she would call her pharmacy and if they have one, she will get one to drink. I again explained to her that I could not tell her to do that, but if she did get it, make sure that it is a clear drink, not a milk one. She said "If I drink it, it won't kill me and if I don't get to drink it, it won't kill me either".   Pt will go tomorrow to [facility] to get her Covid test done. Instructed her about being in quarantine. She voiced understanding.   Coronavirus Screening  Have you experienced the following symptoms:  Cough NO Fever (>100.39F)  NO Runny nose NO Sore throat NO Difficulty breathing/shortness of breath NO   Have you or a family member traveled in the last 14 days and where? NO   If the patient indicates "YES" to the above questions, their PAT will be rescheduled to limit the exposure to others and, the surgeon will be notified. THE PATIENT WILL NEED TO BE ASYMPTOMATIC FOR 14 DAYS.   If the patient is not experiencing any of these symptoms, the PAT nurse will instruct them to NOT bring anyone with them to their appointment since they may have these symptoms or traveled as well.   Patient reminded that hospital visitation restrictions are in effect and the importance of the restrictions. I informed pt that she may one visitor to come with her, but would have to wait in the waiting area while pt was getting ready for surgery and during surgery. She may have 1 visitor to visit during visiting hours after pt is admitted. She voiced understanding.

## 2018-11-15 NOTE — Progress Notes (Signed)
53 year old white female who is status post left L3 & L4 hemilaminectomy, removal of herniated nucleus pulposus by Dr. Lorin Mercy October 30, 2018 returns.  Patient was recently seen by Dr. Lorin Mercy for staple removal.  Over the last 2 to 3 days serosanguineous drainage or wound that has been progressively getting worse.  Drainage soaks clothing.  No complaints of fever chills significant increase in pain.   Exam Incision is red.  At the bottom of the incision there is a half a centimeter opening that is draining serosanguineous fluid.  Some tenderness around the incision.  Plan Dr. Lorin Mercy is on vacation but I did text him pictures of patient's wound.  I did get aerobic/anaerobic cultures and Gram stain.  Will start patient on doxycycline 100 mg p.o. twice daily.  Refilled Percocet.  Dr. Lorin Mercy advised me to have patient posted for lumbar wound I&D Monday morning and this was done.  Patient advised and did speak with the scheduler.  She voices understanding.

## 2018-11-16 ENCOUNTER — Other Ambulatory Visit (HOSPITAL_COMMUNITY)
Admission: RE | Admit: 2018-11-16 | Discharge: 2018-11-16 | Disposition: A | Discharge status: Home / Self Care | Payer: BC Managed Care – PPO | Source: Ambulatory Visit | Attending: Orthopaedic Surgery | Admitting: Orthopaedic Surgery

## 2018-11-16 DIAGNOSIS — Z01812 Encounter for preprocedural laboratory examination: Secondary | ICD-10-CM | POA: Diagnosis not present

## 2018-11-16 DIAGNOSIS — Z20828 Contact with and (suspected) exposure to other viral communicable diseases: Secondary | ICD-10-CM | POA: Diagnosis not present

## 2018-11-16 LAB — SARS CORONAVIRUS 2 (TAT 6-24 HRS): SARS Coronavirus 2: NEGATIVE

## 2018-11-18 ENCOUNTER — Inpatient Hospital Stay (HOSPITAL_COMMUNITY): Payer: BC Managed Care – PPO | Admitting: Anesthesiology

## 2018-11-18 ENCOUNTER — Inpatient Hospital Stay (HOSPITAL_COMMUNITY)
Admission: RE | Admit: 2018-11-18 | Discharge: 2018-11-22 | DRG: 902 | Disposition: A | Discharge status: Home Health | Payer: BC Managed Care – PPO | Attending: Orthopaedic Surgery | Admitting: Orthopaedic Surgery

## 2018-11-18 ENCOUNTER — Encounter (HOSPITAL_COMMUNITY): Payer: Self-pay | Admitting: Surgery

## 2018-11-18 ENCOUNTER — Encounter (HOSPITAL_COMMUNITY)
Admission: RE | Disposition: A | Discharge status: Home Health | Payer: Self-pay | Source: Home / Self Care | Attending: Orthopaedic Surgery

## 2018-11-18 ENCOUNTER — Other Ambulatory Visit: Payer: Self-pay

## 2018-11-18 DIAGNOSIS — Z882 Allergy status to sulfonamides status: Secondary | ICD-10-CM | POA: Diagnosis not present

## 2018-11-18 DIAGNOSIS — T148XXA Other injury of unspecified body region, initial encounter: Secondary | ICD-10-CM

## 2018-11-18 DIAGNOSIS — T8189XA Other complications of procedures, not elsewhere classified, initial encounter: Secondary | ICD-10-CM | POA: Diagnosis not present

## 2018-11-18 DIAGNOSIS — F418 Other specified anxiety disorders: Secondary | ICD-10-CM | POA: Diagnosis not present

## 2018-11-18 DIAGNOSIS — L7634 Postprocedural seroma of skin and subcutaneous tissue following other procedure: Secondary | ICD-10-CM | POA: Diagnosis not present

## 2018-11-18 DIAGNOSIS — Y838 Other surgical procedures as the cause of abnormal reaction of the patient, or of later complication, without mention of misadventure at the time of the procedure: Secondary | ICD-10-CM | POA: Diagnosis present

## 2018-11-18 DIAGNOSIS — Z833 Family history of diabetes mellitus: Secondary | ICD-10-CM | POA: Diagnosis not present

## 2018-11-18 DIAGNOSIS — Z9049 Acquired absence of other specified parts of digestive tract: Secondary | ICD-10-CM | POA: Diagnosis not present

## 2018-11-18 DIAGNOSIS — E119 Type 2 diabetes mellitus without complications: Secondary | ICD-10-CM | POA: Diagnosis not present

## 2018-11-18 DIAGNOSIS — T8141XA Infection following a procedure, superficial incisional surgical site, initial encounter: Secondary | ICD-10-CM | POA: Diagnosis not present

## 2018-11-18 DIAGNOSIS — Z6835 Body mass index (BMI) 35.0-35.9, adult: Secondary | ICD-10-CM | POA: Diagnosis not present

## 2018-11-18 DIAGNOSIS — E785 Hyperlipidemia, unspecified: Secondary | ICD-10-CM | POA: Diagnosis present

## 2018-11-18 DIAGNOSIS — F17210 Nicotine dependence, cigarettes, uncomplicated: Secondary | ICD-10-CM | POA: Diagnosis present

## 2018-11-18 DIAGNOSIS — L089 Local infection of the skin and subcutaneous tissue, unspecified: Secondary | ICD-10-CM | POA: Diagnosis present

## 2018-11-18 DIAGNOSIS — M96842 Postprocedural seroma of a musculoskeletal structure following a musculoskeletal system procedure: Secondary | ICD-10-CM | POA: Diagnosis not present

## 2018-11-18 DIAGNOSIS — M7989 Other specified soft tissue disorders: Secondary | ICD-10-CM | POA: Diagnosis present

## 2018-11-18 HISTORY — PX: I & D EXTREMITY: SHX5045

## 2018-11-18 HISTORY — PX: APPLICATION OF WOUND VAC: SHX5189

## 2018-11-18 LAB — CBC WITH DIFFERENTIAL/PLATELET
Abs Immature Granulocytes: 0.03 10*3/uL (ref 0.00–0.07)
Basophils Absolute: 0.1 10*3/uL (ref 0.0–0.1)
Basophils Relative: 1 %
Eosinophils Absolute: 0.5 10*3/uL (ref 0.0–0.5)
Eosinophils Relative: 6 %
HCT: 41.7 % (ref 36.0–46.0)
Hemoglobin: 13.6 g/dL (ref 12.0–15.0)
Immature Granulocytes: 0 %
Lymphocytes Relative: 34 %
Lymphs Abs: 3 10*3/uL (ref 0.7–4.0)
MCH: 30.5 pg (ref 26.0–34.0)
MCHC: 32.6 g/dL (ref 30.0–36.0)
MCV: 93.5 fL (ref 80.0–100.0)
Monocytes Absolute: 0.6 10*3/uL (ref 0.1–1.0)
Monocytes Relative: 7 %
Neutro Abs: 4.5 10*3/uL (ref 1.7–7.7)
Neutrophils Relative %: 52 %
Platelets: 327 10*3/uL (ref 150–400)
RBC: 4.46 MIL/uL (ref 3.87–5.11)
RDW: 12.5 % (ref 11.5–15.5)
WBC: 8.7 10*3/uL (ref 4.0–10.5)
nRBC: 0 % (ref 0.0–0.2)

## 2018-11-18 LAB — COMPREHENSIVE METABOLIC PANEL
ALT: 17 U/L (ref 0–44)
AST: 14 U/L — ABNORMAL LOW (ref 15–41)
Albumin: 3.6 g/dL (ref 3.5–5.0)
Alkaline Phosphatase: 64 U/L (ref 38–126)
Anion gap: 7 (ref 5–15)
BUN: 10 mg/dL (ref 6–20)
CO2: 22 mmol/L (ref 22–32)
Calcium: 9.2 mg/dL (ref 8.9–10.3)
Chloride: 105 mmol/L (ref 98–111)
Creatinine, Ser: 0.55 mg/dL (ref 0.44–1.00)
GFR calc Af Amer: >60 mL/min (ref >60)
GFR calc non Af Amer: >60 mL/min (ref >60)
Glucose, Bld: 106 mg/dL — ABNORMAL HIGH (ref 70–99)
Potassium: 3.8 mmol/L (ref 3.5–5.1)
Sodium: 134 mmol/L — ABNORMAL LOW (ref 135–145)
Total Bilirubin: 0.4 mg/dL (ref 0.3–1.2)
Total Protein: 6.5 g/dL (ref 6.5–8.1)

## 2018-11-18 LAB — URINALYSIS, ROUTINE W REFLEX MICROSCOPIC
Bilirubin Urine: NEGATIVE
Glucose, UA: NEGATIVE mg/dL
Ketones, ur: NEGATIVE mg/dL
Leukocytes,Ua: NEGATIVE
Nitrite: NEGATIVE
Protein, ur: NEGATIVE mg/dL
Specific Gravity, Urine: 1.014 (ref 1.005–1.030)
pH: 5 (ref 5.0–8.0)

## 2018-11-18 SURGERY — IRRIGATION AND DEBRIDEMENT EXTREMITY
Anesthesia: General | Site: Back

## 2018-11-18 MED ORDER — DIAZEPAM 5 MG PO TABS
5.0000 mg | ORAL_TABLET | Freq: Two times a day (BID) | ORAL | Status: DC | PRN
  Administered 2018-11-18 – 2018-11-22 (×6): 5 mg via ORAL
  Filled 2018-11-18 (×6): qty 1

## 2018-11-18 MED ORDER — EPHEDRINE 5 MG/ML INJ
INTRAVENOUS | Status: AC
  Filled 2018-11-18: qty 20

## 2018-11-18 MED ORDER — LACTATED RINGERS IV SOLN
INTRAVENOUS | Status: DC
  Administered 2018-11-18: 13:00:00 via INTRAVENOUS

## 2018-11-18 MED ORDER — EPHEDRINE SULFATE 50 MG/ML IJ SOLN
INTRAMUSCULAR | Status: DC | PRN
  Administered 2018-11-18: 5 mg via INTRAVENOUS

## 2018-11-18 MED ORDER — PROPOFOL 10 MG/ML IV BOLUS
INTRAVENOUS | Status: DC | PRN
  Administered 2018-11-18: 180 mg via INTRAVENOUS

## 2018-11-18 MED ORDER — CEFAZOLIN SODIUM-DEXTROSE 2-3 GM-%(50ML) IV SOLR
INTRAVENOUS | Status: DC | PRN
  Administered 2018-11-18: 2 g via INTRAVENOUS

## 2018-11-18 MED ORDER — FENTANYL CITRATE (PF) 250 MCG/5ML IJ SOLN
INTRAMUSCULAR | Status: DC | PRN
  Administered 2018-11-18: 50 ug via INTRAVENOUS
  Administered 2018-11-18: 100 ug via INTRAVENOUS

## 2018-11-18 MED ORDER — HYDROMORPHONE HCL 1 MG/ML IJ SOLN: 0.2500 mg | INTRAMUSCULAR | Status: DC | PRN

## 2018-11-18 MED ORDER — ROCURONIUM BROMIDE 10 MG/ML (PF) SYRINGE
PREFILLED_SYRINGE | INTRAVENOUS | Status: DC | PRN
  Administered 2018-11-18: 50 mg via INTRAVENOUS

## 2018-11-18 MED ORDER — LIDOCAINE 2% (20 MG/ML) 5 ML SYRINGE
INTRAMUSCULAR | Status: DC | PRN
  Administered 2018-11-18: 60 mg via INTRAVENOUS

## 2018-11-18 MED ORDER — FENTANYL CITRATE (PF) 250 MCG/5ML IJ SOLN
INTRAMUSCULAR | Status: AC
  Filled 2018-11-18: qty 5

## 2018-11-18 MED ORDER — ONDANSETRON HCL 4 MG/2ML IJ SOLN: 4.0000 mg | Freq: Four times a day (QID) | INTRAMUSCULAR | Status: DC | PRN

## 2018-11-18 MED ORDER — GABAPENTIN 300 MG PO CAPS
900.0000 mg | ORAL_CAPSULE | Freq: Every day | ORAL | Status: DC
  Administered 2018-11-19 – 2018-11-22 (×4): 900 mg via ORAL
  Filled 2018-11-18 (×4): qty 3

## 2018-11-18 MED ORDER — OXYCODONE HCL 5 MG PO TABS
5.0000 mg | ORAL_TABLET | Freq: Four times a day (QID) | ORAL | Status: DC | PRN
  Administered 2018-11-18 – 2018-11-22 (×16): 5 mg via ORAL
  Filled 2018-11-18 (×16): qty 1

## 2018-11-18 MED ORDER — SODIUM CHLORIDE 0.9% FLUSH: 3.0000 mL | INTRAVENOUS | Status: DC | PRN

## 2018-11-18 MED ORDER — ONDANSETRON HCL 4 MG/2ML IJ SOLN
INTRAMUSCULAR | Status: AC
  Filled 2018-11-18: qty 6

## 2018-11-18 MED ORDER — SODIUM CHLORIDE 0.9 % IV SOLN: INTRAVENOUS | Status: DC

## 2018-11-18 MED ORDER — ACETAMINOPHEN 325 MG PO TABS
650.0000 mg | ORAL_TABLET | ORAL | Status: DC | PRN
  Administered 2018-11-19 – 2018-11-22 (×5): 650 mg via ORAL
  Filled 2018-11-18 (×5): qty 2

## 2018-11-18 MED ORDER — SERTRALINE HCL 50 MG PO TABS
100.0000 mg | ORAL_TABLET | Freq: Every day | ORAL | Status: DC
  Administered 2018-11-18 – 2018-11-21 (×4): 100 mg via ORAL
  Filled 2018-11-18 (×4): qty 2

## 2018-11-18 MED ORDER — SUGAMMADEX SODIUM 500 MG/5ML IV SOLN
INTRAVENOUS | Status: AC
  Filled 2018-11-18: qty 5

## 2018-11-18 MED ORDER — PHENOL 1.4 % MT LIQD: 1.0000 | OROMUCOSAL | Status: DC | PRN

## 2018-11-18 MED ORDER — SUGAMMADEX SODIUM 200 MG/2ML IV SOLN
INTRAVENOUS | Status: DC | PRN
  Administered 2018-11-18: 300 mg via INTRAVENOUS

## 2018-11-18 MED ORDER — DOCUSATE SODIUM 100 MG PO CAPS
100.0000 mg | ORAL_CAPSULE | Freq: Two times a day (BID) | ORAL | Status: DC
  Administered 2018-11-18 – 2018-11-22 (×8): 100 mg via ORAL
  Filled 2018-11-18 (×8): qty 1

## 2018-11-18 MED ORDER — CHLORHEXIDINE GLUCONATE 4 % EX LIQD: 60.0000 mL | Freq: Once | CUTANEOUS | Status: DC

## 2018-11-18 MED ORDER — 0.9 % SODIUM CHLORIDE (POUR BTL) OPTIME
TOPICAL | Status: DC | PRN
  Administered 2018-11-18 (×2): 1000 mL

## 2018-11-18 MED ORDER — PROPOFOL 10 MG/ML IV BOLUS
INTRAVENOUS | Status: AC
  Filled 2018-11-18: qty 20

## 2018-11-18 MED ORDER — SODIUM CHLORIDE 0.9% FLUSH
3.0000 mL | Freq: Two times a day (BID) | INTRAVENOUS | Status: DC
  Administered 2018-11-18 – 2018-11-21 (×6): 3 mL via INTRAVENOUS

## 2018-11-18 MED ORDER — CEFAZOLIN SODIUM-DEXTROSE 2-4 GM/100ML-% IV SOLN
INTRAVENOUS | Status: AC
  Filled 2018-11-18: qty 100

## 2018-11-18 MED ORDER — ACETAMINOPHEN 650 MG RE SUPP: 650.0000 mg | RECTAL | Status: DC | PRN

## 2018-11-18 MED ORDER — HYDROMORPHONE HCL 1 MG/ML IJ SOLN
0.5000 mg | INTRAMUSCULAR | Status: DC | PRN
  Administered 2018-11-19 (×2): 0.5 mg via INTRAVENOUS
  Filled 2018-11-18 (×2): qty 0.5

## 2018-11-18 MED ORDER — CEFAZOLIN SODIUM-DEXTROSE 1-4 GM/50ML-% IV SOLN
1.0000 g | Freq: Three times a day (TID) | INTRAVENOUS | Status: AC
  Administered 2018-11-18 – 2018-11-21 (×9): 1 g via INTRAVENOUS
  Filled 2018-11-18 (×9): qty 50

## 2018-11-18 MED ORDER — ONDANSETRON HCL 4 MG/2ML IJ SOLN
INTRAMUSCULAR | Status: DC | PRN
  Administered 2018-11-18: 4 mg via INTRAVENOUS

## 2018-11-18 MED ORDER — ONDANSETRON HCL 4 MG PO TABS: 4.0000 mg | ORAL_TABLET | Freq: Four times a day (QID) | ORAL | Status: DC | PRN

## 2018-11-18 MED ORDER — PHENYLEPHRINE 40 MCG/ML (10ML) SYRINGE FOR IV PUSH (FOR BLOOD PRESSURE SUPPORT)
PREFILLED_SYRINGE | INTRAVENOUS | Status: AC
  Filled 2018-11-18: qty 20

## 2018-11-18 MED ORDER — MIDAZOLAM HCL 5 MG/5ML IJ SOLN
INTRAMUSCULAR | Status: DC | PRN
  Administered 2018-11-18: 2 mg via INTRAVENOUS

## 2018-11-18 MED ORDER — ACETAMINOPHEN 500 MG PO TABS
1000.0000 mg | ORAL_TABLET | Freq: Once | ORAL | Status: AC
  Administered 2018-11-18: 1000 mg via ORAL
  Filled 2018-11-18: qty 2

## 2018-11-18 MED ORDER — LACTATED RINGERS IV SOLN
INTRAVENOUS | Status: DC | PRN
  Administered 2018-11-18 (×2): via INTRAVENOUS

## 2018-11-18 MED ORDER — MENTHOL 3 MG MT LOZG: 1.0000 | LOZENGE | OROMUCOSAL | Status: DC | PRN

## 2018-11-18 MED ORDER — MIDAZOLAM HCL 2 MG/2ML IJ SOLN
INTRAMUSCULAR | Status: AC
  Filled 2018-11-18: qty 2

## 2018-11-18 SURGICAL SUPPLY — 45 items
BAG DECANTER FOR FLEXI CONT (MISCELLANEOUS) ×2 IMPLANT
BNDG COHESIVE 4X5 TAN STRL (GAUZE/BANDAGES/DRESSINGS) ×2 IMPLANT
BNDG ELASTIC 4X5.8 VLCR STR LF (GAUZE/BANDAGES/DRESSINGS) ×2 IMPLANT
BNDG GAUZE ELAST 4 BULKY (GAUZE/BANDAGES/DRESSINGS) ×2 IMPLANT
CANISTER WOUNDNEG PRESSURE 500 (CANNISTER) ×1 IMPLANT
COVER SURGICAL LIGHT HANDLE (MISCELLANEOUS) ×2 IMPLANT
COVER WAND RF STERILE (DRAPES) ×2 IMPLANT
CUFF TOURN SGL QUICK 18X4 (TOURNIQUET CUFF) ×2 IMPLANT
DRAPE INCISE IOBAN 66X45 STRL (DRAPES) ×1 IMPLANT
DRSG EMULSION OIL 3X3 NADH (GAUZE/BANDAGES/DRESSINGS) ×2 IMPLANT
DRSG PAD ABDOMINAL 8X10 ST (GAUZE/BANDAGES/DRESSINGS) ×4 IMPLANT
DRSG VAC ATS SM SENSATRAC (GAUZE/BANDAGES/DRESSINGS) ×1 IMPLANT
ELECT REM PT RETURN 9FT ADLT (ELECTROSURGICAL)
ELECTRODE REM PT RTRN 9FT ADLT (ELECTROSURGICAL) IMPLANT
GAUZE SPONGE 4X4 12PLY STRL (GAUZE/BANDAGES/DRESSINGS) ×2 IMPLANT
GAUZE XEROFORM 5X9 LF (GAUZE/BANDAGES/DRESSINGS) ×2 IMPLANT
GLOVE BIOGEL PI IND STRL 8 (GLOVE) ×1 IMPLANT
GLOVE BIOGEL PI INDICATOR 8 (GLOVE) ×1
GLOVE ORTHO TXT STRL SZ7.5 (GLOVE) ×2 IMPLANT
GOWN STRL REUS W/ TWL LRG LVL3 (GOWN DISPOSABLE) ×1 IMPLANT
GOWN STRL REUS W/ TWL XL LVL3 (GOWN DISPOSABLE) ×1 IMPLANT
GOWN STRL REUS W/TWL 2XL LVL3 (GOWN DISPOSABLE) IMPLANT
GOWN STRL REUS W/TWL LRG LVL3 (GOWN DISPOSABLE) ×1
GOWN STRL REUS W/TWL XL LVL3 (GOWN DISPOSABLE) ×1
HANDPIECE INTERPULSE COAX TIP (DISPOSABLE)
KIT BASIN OR (CUSTOM PROCEDURE TRAY) ×2 IMPLANT
KIT PREVENA INCISION MGT 13 (CANNISTER) IMPLANT
KIT TURNOVER KIT B (KITS) ×2 IMPLANT
MANIFOLD NEPTUNE II (INSTRUMENTS) ×2 IMPLANT
NS IRRIG 1000ML POUR BTL (IV SOLUTION) ×2 IMPLANT
PACK ORTHO EXTREMITY (CUSTOM PROCEDURE TRAY) ×2 IMPLANT
PAD ARMBOARD 7.5X6 YLW CONV (MISCELLANEOUS) ×4 IMPLANT
SET HNDPC FAN SPRY TIP SCT (DISPOSABLE) IMPLANT
SPONGE LAP 18X18 RF (DISPOSABLE) ×2 IMPLANT
SPONGE LAP 4X18 RFD (DISPOSABLE) ×2 IMPLANT
STOCKINETTE IMPERVIOUS 9X36 MD (GAUZE/BANDAGES/DRESSINGS) ×2 IMPLANT
SUT ETHILON 2 0 PSLX (SUTURE) ×2 IMPLANT
SUT ETHILON 4 0 PS 2 18 (SUTURE) ×8 IMPLANT
SWAB CULTURE ESWAB REG 1ML (MISCELLANEOUS) IMPLANT
TOWEL GREEN STERILE (TOWEL DISPOSABLE) ×2 IMPLANT
TOWEL GREEN STERILE FF (TOWEL DISPOSABLE) ×2 IMPLANT
TUBE CONNECTING 12X1/4 (SUCTIONS) ×2 IMPLANT
UNDERPAD 30X30 (UNDERPADS AND DIAPERS) ×2 IMPLANT
WATER STERILE IRR 1000ML POUR (IV SOLUTION) ×2 IMPLANT
YANKAUER SUCT BULB TIP NO VENT (SUCTIONS) ×2 IMPLANT

## 2018-11-18 NOTE — Transfer of Care (Signed)
Immediate Anesthesia Transfer of Care Note  Patient: Emily Bender  Procedure(s) Performed: EVACUATION OF LUMBAR SEROMA/HEMATOMA (N/A Back) Application Of Wound Vac (N/A Back)  Patient Location: PACU  Anesthesia Type:General  Level of Consciousness: awake  Airway & Oxygen Therapy: Patient Spontanous Breathing and Patient connected to face mask oxygen  Post-op Assessment: Report given to RN and Post -op Vital signs reviewed and stable  Post vital signs: Reviewed and stable  Last Vitals:  Vitals Value Taken Time  BP 144/86 11/18/18 1545  Temp 36.7 C 11/18/18 1545  Pulse 74 11/18/18 1556  Resp 27 11/18/18 1556  SpO2 92 % 11/18/18 1556  Vitals shown include unvalidated device data.  Last Pain:  Vitals:   11/18/18 1545  TempSrc:   PainSc: 0-No pain      Patients Stated Pain Goal: 3 (27/25/36 6440)  Complications: No apparent anesthesia complications

## 2018-11-18 NOTE — Anesthesia Procedure Notes (Signed)
Procedure Name: Intubation Date/Time: 11/18/2018 2:59 PM Performed by: Clovis Cao, CRNA Pre-anesthesia Checklist: Patient identified, Emergency Drugs available, Suction available, Patient being monitored and Timeout performed Patient Re-evaluated:Patient Re-evaluated prior to induction Oxygen Delivery Method: Circle system utilized Preoxygenation: Pre-oxygenation with 100% oxygen Induction Type: IV induction Ventilation: Mask ventilation without difficulty Laryngoscope Size: Miller and 2 Grade View: Grade I Tube type: Oral Tube size: 7.5 mm Number of attempts: 1 Airway Equipment and Method: Stylet Placement Confirmation: ETT inserted through vocal cords under direct vision,  positive ETCO2 and breath sounds checked- equal and bilateral Secured at: 22 cm Tube secured with: Tape Dental Injury: Teeth and Oropharynx as per pre-operative assessment

## 2018-11-18 NOTE — H&P (Signed)
Emily Bender is an 53 y.o. female.   Chief Complaint: Postop lumbar wound redness and drainage HPI: 53 year old white female who is status post left L3 & L4 hemilaminectomy, removal of herniated nucleus pulposus by Dr. Lorin Mercy October 30, 2018 returns.  Patient was recently seen by Dr. Lorin Mercy for staple removal.  Over the last 2 to 3 days serosanguineous drainage or wound that has been progressively getting worse.  Drainage soaks clothing.  No complaints of fever chills significant increase in pain.   Past Medical History:  Diagnosis Date  . Anxiety   . Arthritis   . Depression   . Hyperlipidemia   . Lumbar stenosis     Past Surgical History:  Procedure Laterality Date  . ANKLE SURGERY  01/17/2012   x3  . BACK SURGERY    . BREAST BIOPSY Left 1990's  . CHOLECYSTECTOMY  2000  . LAMINECTOMY  1996  . LUMBAR LAMINECTOMY N/A 10/30/2018   Procedure: left L3 & L4 hemilaminectomy, removal of herniated nucleus pulposus;  Surgeon: Marybelle Killings, MD;  Location: [place];  Service: Orthopedics;  Laterality: N/A;  . WRIST SURGERY Right     Family History  Problem Relation Age of Onset  . Lung cancer Father   . Healthy Brother   . Aneurysm Maternal Grandmother   . Lung cancer Maternal Grandfather   . Coronary artery disease Paternal Grandmother   . Diabetes Mother   . Hypertension Mother    Social History:  reports that she has been smoking cigarettes. She has a 5.00 pack-year smoking history. She has never used smokeless tobacco. She reports current alcohol use of about 6.0 standard drinks of alcohol per week. She reports current drug use. Frequency: 7.00 times per week. Drug: Marijuana.  Allergies:  Allergies  Allergen Reactions  . Sulfa Antibiotics Hives    No medications prior to admission.    Results for orders placed or performed during the hospital encounter of 11/16/18 (from the past 48 hour(s))  SARS CORONAVIRUS 2 Nasal Swab Aptima Multi Swab     Status: None   Collection Time:  11/16/18  2:17 PM   Specimen: Aptima Multi Swab; Nasal Swab  Result Value Ref Range   SARS Coronavirus 2 NEGATIVE NEGATIVE    Comment: (NOTE) SARS-CoV-2 target nucleic acids are NOT DETECTED. The SARS-CoV-2 RNA is generally detectable in upper and lower respiratory specimens during the acute phase of infection. Negative results do not preclude SARS-CoV-2 infection, do not rule out co-infections with other pathogens, and should not be used as the sole basis for treatment or other patient management decisions. Negative results must be combined with clinical observations, patient history, and epidemiological information. The expected result is Negative. Fact Sheet for Patients: SugarRoll.be Fact Sheet for Healthcare Providers: https://www.woods-mathews.com/ This test is not yet approved or cleared by the [place] FDA and  has been authorized for detection and/or diagnosis of SARS-CoV-2 by FDA under an Emergency Use Authorization (EUA). This EUA will remain  in effect (meaning this test can be used) for the duration of the COVID-19 declaration under Section 56 4(b)(1) of the Act, 21 U.S.C. section 360bbb-3(b)(1), unless the authorization is terminated or revoked sooner. Performed at [facility], [place] [address] [place], [place] [address]    No results found.  Review of Systems  Constitutional: Negative.   HENT: Negative.   Respiratory: Negative.   Cardiovascular: Negative.   Musculoskeletal: Positive for back pain.  Neurological: Negative.   Psychiatric/Behavioral: Negative.  There were no vitals taken for this visit. Physical Exam  Constitutional: She is oriented to person, place, and time.  HENT:  Head: Normocephalic and atraumatic.  Eyes: Pupils are equal, round, and reactive to light. EOM are normal.  Respiratory: No respiratory distress.  Musculoskeletal:     Comments: Exam Incision is red.  At the bottom of  the incision there is a half a centimeter opening that is draining serosanguineous fluid.  Some tenderness around the incision.  Neurological: She is alert and oriented to person, place, and time.  Psychiatric: She has a normal mood and affect.     Assessment/Plan Lumbar postop wound infection.  Patient scheduled today for lumbar wound I&D.  Surgical procedure discussed with her.  All questions answered.  Benjiman Core, PA-C 11/18/2018, 10:28 AM

## 2018-11-18 NOTE — Op Note (Signed)
Preop diagnosis: Postop lumbar left hemilaminectomy, removal of HNP with postoperative training wound seroma/hematoma  Postop diagnosis: Same  Procedure: Incision and drainage evacuation of postoperative complex lumbar seroma hematoma with partial reclosure and application of small wound VAC.  Surgeon: Rodell Perna, MD  Assistant: Benjiman Core, PA-C medically necessary and present for the entire procedure  Anesthesia: General  Brief history 53 year old female underwent surgery on 10/30/2018 with history of excessive alcohol use, diabetes, previous microdiscectomy in the early 80s with hemilaminectomy on the left L4 and removal of a large migrated cephalad free fragment.  Patient did well postop and had relief of pain.  She went down to the beach and returned 3 days ago to the office on Friday with drainage at the inferior aspect of her wound with clear bloody fluid.  Cultures were obtained and this point aerobic and anaerobic cultures are negative and Gram stain was negative.  She was placed on doxycycline 100 mg p.o. twice daily and scheduled for wound hematoma/seroma evacuation and reclosure.  Patient's albumin was low at 3.6 previously 4.2.  Patient is a smoker and white count was 8,700 with normal differential.  Patient has remained afebrile.  Procedure: After induction of general anesthesia patient was placed prone on chest rolls careful padding and positioning arms at 9090 yellow pads underneath the shoulders and ulnar nerve.  Calf pumpers were applied for DVT prophylaxis.  Patient had erythema prominence and subcutaneous fluid collection.  In the last 24 hours she had a small spot of dressing drainage that had significantly decreased from Friday when she was draining copious amounts of serosanguineous fluid.  Sterile skin marker was used after sterile prep area squared with towels Betadine Steri-Drape application and laminectomy sheet.  Timeout procedure was completed antibiotics were held until  cultures were obtained.  Previous incision was opened and there was abundant amount of serosanguineous fluid.  Aerobic and anaerobic cultures were obtained and then Ancef was started.  Wound was opened and extended 1 cm distally with some frothy fat necrosis that did not have odor and no purulence was noted.  This was curetted debrided sharply with pickups scissors excising back to normal fat area.  Tissue down to the lamina appeared normal.  No purulence was noted at the depth.  After cultures copious irrigation 1 L was performed and the edges of the fascia were debrided with a curette.  Proximal area of the tissue of her wound looked normal and 0 Vicryl was used for interrupted reapproximation of the upper half of her incision.  Inferior aspect had a area that was 3.5 x 1.5 and 7 cm deep.  Small VAC was applied tincture benzoin applied around it the VAC was applied in the normal fashion with good suction and low leak rate.  An ABD was placed over the top and tape.  Patient tolerated the procedure well was transferred recovery room in stable condition.  Postop plan is IV Ancef pending culture results which should occur by Wednesday at 48 hours.  She will undergo VAC change on Wednesday and everything looks good she will be able to be discharged home with antibiotics based on cultures and VAC changes to speed healing of her deep wound with her history of smoking, excess alcohol consumption, and diabetes.

## 2018-11-18 NOTE — Anesthesia Postprocedure Evaluation (Signed)
Anesthesia Post Note  Patient: Emily Bender  Procedure(s) Performed: EVACUATION OF LUMBAR SEROMA/HEMATOMA (N/A Back) Application Of Wound Vac (N/A Back)     Patient location during evaluation: PACU Anesthesia Type: General Level of consciousness: awake and alert Pain management: pain level controlled Vital Signs Assessment: post-procedure vital signs reviewed and stable Respiratory status: spontaneous breathing, nonlabored ventilation, respiratory function stable and patient connected to nasal cannula oxygen Cardiovascular status: blood pressure returned to baseline and stable Postop Assessment: no apparent nausea or vomiting Anesthetic complications: no    Last Vitals:  Vitals:   11/18/18 1600 11/18/18 1620  BP: 140/81 (!) 150/90  Pulse: 74 65  Resp: 18   Temp:    SpO2: 92% 97%    Last Pain:  Vitals:   11/18/18 1620  TempSrc:   PainSc: 0-No pain                 Tiajuana Amass

## 2018-11-18 NOTE — Anesthesia Preprocedure Evaluation (Addendum)
Anesthesia Evaluation  Patient identified by MRN, date of birth, ID band Patient awake    Reviewed: Allergy & Precautions, H&P , NPO status , Patient's Chart, lab work & pertinent test results  Airway Mallampati: II  TM Distance: >3 FB Neck ROM: Full    Dental no notable dental hx. (+) Teeth Intact, Dental Advisory Given   Pulmonary Current Smoker,    Pulmonary exam normal breath sounds clear to auscultation       Cardiovascular negative cardio ROS   Rhythm:Regular Rate:Normal     Neuro/Psych Anxiety Depression negative neurological ROS     GI/Hepatic negative GI ROS, Neg liver ROS,   Endo/Other  Morbid obesity  Renal/GU negative Renal ROS  negative genitourinary   Musculoskeletal  (+) Arthritis , Osteoarthritis,    Abdominal   Peds  Hematology negative hematology ROS (+)   Anesthesia Other Findings   Reproductive/Obstetrics negative OB ROS                            Anesthesia Physical Anesthesia Plan  ASA: II  Anesthesia Plan: General   Post-op Pain Management:    Induction: Intravenous  PONV Risk Score and Plan: 3 and Ondansetron, Dexamethasone and Midazolam  Airway Management Planned: Oral ETT  Additional Equipment:   Intra-op Plan:   Post-operative Plan: Extubation in OR  Informed Consent: I have reviewed the patients History and Physical, chart, labs and discussed the procedure including the risks, benefits and alternatives for the proposed anesthesia with the patient or authorized representative who has indicated his/her understanding and acceptance.     Dental advisory given  Plan Discussed with: CRNA  Anesthesia Plan Comments:         Anesthesia Quick Evaluation

## 2018-11-19 ENCOUNTER — Encounter (HOSPITAL_COMMUNITY): Payer: Self-pay | Admitting: Orthopaedic Surgery

## 2018-11-19 LAB — BASIC METABOLIC PANEL
Anion gap: 10 (ref 5–15)
BUN: 10 mg/dL (ref 6–20)
CO2: 26 mmol/L (ref 22–32)
Calcium: 9.1 mg/dL (ref 8.9–10.3)
Chloride: 102 mmol/L (ref 98–111)
Creatinine, Ser: 0.57 mg/dL (ref 0.44–1.00)
GFR calc Af Amer: >60 mL/min (ref >60)
GFR calc non Af Amer: >60 mL/min (ref >60)
Glucose, Bld: 105 mg/dL — ABNORMAL HIGH (ref 70–99)
Potassium: 4.6 mmol/L (ref 3.5–5.1)
Sodium: 138 mmol/L (ref 135–145)

## 2018-11-19 MED ORDER — OXYCODONE-ACETAMINOPHEN 5-325 MG PO TABS: 1.0000 | ORAL_TABLET | Freq: Four times a day (QID) | ORAL | 0 refills | Status: DC | PRN

## 2018-11-19 MED ORDER — DOXYCYCLINE HYCLATE 50 MG PO CAPS: 100.0000 mg | ORAL_CAPSULE | Freq: Two times a day (BID) | ORAL | 0 refills | Status: DC

## 2018-11-19 NOTE — Progress Notes (Signed)
I was called and told the patient could go home with the [facility] today.  We will send in new prescription for doxycycline with patient's Gram stain showing gram-positive.  She was on this previously.  New prescription of Percocet sent in.  She can follow-up with me in 2 weeks and VAC form is been filled out for her 7 x 3.5 x 1.5 lumbar wound.

## 2018-11-19 NOTE — TOC Initial Note (Signed)
Transition of Care [facility]) - Initial/Assessment Note    Patient Details  Name: Emily Bender MRN: [id number] Date of Birth: 1965/11/06  Transition of Care [facility]) CM/SW Contact:    Benard Halsted, LCSW Phone Number: 11/19/2018, 4:43 PM  Clinical Narrative:                 CSW still waiting on insurance approval for the [facility] wound vac to be delivered. They anticipate delivery in the morning. CSW still searching for accepting home health agency. Denied by [facility], [facility], [facility], [facility], [facility], and [facility]. [facility] reviewing referral.    Expected Discharge Plan: [place] Barriers to Discharge: Equipment Delay, Insurance Authorization   Patient Goals and CMS Choice Patient states their goals for this hospitalization and ongoing recovery are:: Return home CMS Medicare.gov Compare Post Acute Care list provided to:: Patient Choice offered to / list presented to : Patient  Expected Discharge Plan and Services Expected Discharge Plan: [place] In-house Referral: NA Discharge Planning Services: CM Consult Post Acute Care Choice: Home Health, Durable Medical Equipment Living arrangements for the past 2 months: Single Family Home Expected Discharge Date: 11/19/18               DME Arranged: Harlin Heys DME Agency: [facility] Date DME Agency Contacted: 11/19/18 Time DME Agency Contacted: 1200   HH Arranged: RN   Date HH Agency Contacted: 11/19/18 Time [place] Contacted: 1500    Prior Living Arrangements/Services Living arrangements for the past 2 months: Single Family Home Lives with:: Self Patient language and need for interpreter reviewed:: Yes Do you feel safe going back to the place where you live?: Yes      Need for Family Participation in Patient Care: No (Comment) Care giver support system in place?: Yes (comment)   Criminal Activity/Legal Involvement Pertinent to Current Situation/Hospitalization: No - Comment as needed  Activities of Daily  Living Home Assistive Devices/Equipment: Eyeglasses ADL Screening (condition at time of admission) Patient's cognitive ability adequate to safely complete daily activities?: Yes Is the patient deaf or have difficulty hearing?: No Does the patient have difficulty seeing, even when wearing glasses/contacts?: No Does the patient have difficulty concentrating, remembering, or making decisions?: No Patient able to express need for assistance with ADLs?: Yes Does the patient have difficulty dressing or bathing?: Yes Independently performs ADLs?: No Communication: Independent Dressing (OT): Needs assistance Is this a change from baseline?: Change from baseline, expected to last <3days Grooming: Independent Feeding: Independent Bathing: Needs assistance Is this a change from baseline?: Change from baseline, expected to last <3 days Toileting: Needs assistance Is this a change from baseline?: Change from baseline, expected to last <3 days In/Out Bed: Needs assistance Is this a change from baseline?: Change from baseline, expected to last <3 days Does the patient have difficulty walking or climbing stairs?: No Weakness of Legs: Both Weakness of Arms/Hands: None  Permission Sought/Granted Permission sought to share information with : Facility Art therapist granted to share information with : Yes, Verbal Permission Granted     Permission granted to share info w AGENCY: Home Health and [facility]        Emotional Assessment Appearance:: Appears stated age Attitude/Demeanor/Rapport: Engaged Affect (typically observed): Accepting, Appropriate Orientation: : Oriented to Place, Oriented to Self, Oriented to  Time, Oriented to Situation Alcohol / Substance Use: Tobacco Use Psych Involvement: No (comment)  Admission diagnosis:  post op lumbar infection Patient Active Problem List   Diagnosis  Date Noted  . Wound infection 11/18/2018  . Lumbar stenosis 10/30/2018  . Herniated  nucleus pulposus, lumbar 10/30/2018  . Spinal stenosis of lumbar region 10/16/2018  . Lumbar herniated disc 10/14/2018  . Alcohol drinker 05/20/2015  . Anxiety and depression 05/20/2015  . History of artificial joint 05/20/2015  . HLD (hyperlipidemia) 05/20/2015  . Avitaminosis D 05/20/2015  . Chronic cough 01/20/2014  . Adiposity 01/18/2012  . Irregular bleeding 11/27/2006  . Tobacco use 06/23/2005   PCP:  Margo Common, PA Pharmacy:   [place], [place] - [address] [place] [address] Phone: [phone] Fax: [phone]     Social Determinants of Health (SDOH) Interventions    Readmission Risk Interventions No flowsheet data found.

## 2018-11-19 NOTE — Progress Notes (Signed)
CSW left [employer] Wound Vac authorization on patient's chart for MD signature, then [employer] can begin the insurance process. Will search for [facility] agency.   Percell Locus Vashon Arch LCSW [phone]

## 2018-11-19 NOTE — Evaluation (Signed)
Physical Therapy Evaluation and Discharge Patient Details Name: Emily Bender MRN: [id number] DOB: 1965-09-08 Today's Date: 11/19/2018   History of Present Illness  Pt is a 53 y/o female with a PMH significant for left L3-4 hemilaminectomy, microdiscectomy and removal of HNP on 10/30/2018. She presents back s/p I&D of lumbar wound with application of wound VAC.   Clinical Impression  Patient evaluated by Physical Therapy with no further acute PT needs identified. All education has been completed and the patient has no further questions. At the time of PT eval pt was able to perform transfers and ambulation with gross modified independence. Pt without overt LOB however noted lateral sway with ambulation in hall. Feel this may be pain related (L thigh/groin into hip) vs gluteal med weakness. Pt was educated on precautions, safety with VAC management during mobility, and activity progression at home.  See below for any follow-up Physical Therapy or equipment needs. PT is signing off. Thank you for this referral.     Follow Up Recommendations No PT follow up;Supervision for mobility/OOB(for VAC management with mobility)    Equipment Recommendations  None recommended by PT    Recommendations for Other Services       Precautions / Restrictions Precautions Precautions: Back Precaution Booklet Issued: Yes (comment) Precaution Comments: reviewed back precautions with pt Restrictions Weight Bearing Restrictions: No      Mobility  Bed Mobility Overal bed mobility: Modified Independent             General bed mobility comments: Railing used. Pt declining attempting with HOB flat to simulate home environment.   Transfers Overall transfer level: Modified independent Equipment used: None             General transfer comment: No assist required. Pt with good posture and proper hand placement on seated surface for safety.   Ambulation/Gait Ambulation/Gait assistance: Modified  independent (Device/Increase time) Gait Distance (Feet): 400 Feet Assistive device: None Gait Pattern/deviations: Step-through pattern;Decreased stride length;Trendelenburg Gait velocity: decreased Gait velocity interpretation: 1.31 - 2.62 ft/sec, indicative of limited community ambulator General Gait Details: slow but steady gait, no LOB. Significant lateral sway with ambulation - trendelenberg vs pain compensation  Stairs            Wheelchair Mobility    Modified Rankin (Stroke Patients Only)       Balance Overall balance assessment: Mild deficits observed, not formally tested                                           Pertinent Vitals/Pain Pain Assessment: Faces Faces Pain Scale: Hurts little more Pain Location: incision and L thigh/groin into hip Pain Descriptors / Indicators: Operative site guarding Pain Intervention(s): Monitored during session;Limited activity within patient's tolerance;Repositioned    Home Living Family/patient expects to be discharged to:: Private residence Living Arrangements: Parent;Children Available Help at Discharge: Family;Available 24 hours/day Type of Home: House Home Access: Stairs to enter Entrance Stairs-Rails: None Entrance Stairs-Number of Steps: 1 Home Layout: One level Home Equipment: Walker - 2 wheels;Crutches      Prior Function Level of Independence: Independent               Hand Dominance   Dominant Hand: Right    Extremity/Trunk Assessment   Upper Extremity Assessment Upper Extremity Assessment: Defer to OT evaluation    Lower Extremity Assessment Lower Extremity Assessment: Generalized weakness  Cervical / Trunk Assessment Cervical / Trunk Assessment: Other exceptions Cervical / Trunk Exceptions: s/p surgery and I&D with wound VAC applied  Communication   Communication: No difficulties  Cognition Arousal/Alertness: Awake/alert Behavior During Therapy: WFL for tasks  assessed/performed Overall Cognitive Status: Within Functional Limits for tasks assessed                                        General Comments      Exercises     Assessment/Plan    PT Assessment Patent does not need any further PT services  PT Problem List         PT Treatment Interventions      PT Goals (Current goals can be found in the Care Plan section)  Acute Rehab PT Goals Patient Stated Goal: to return home PT Goal Formulation: With patient Potential to Achieve Goals: Good    Frequency     Barriers to discharge        Co-evaluation               AM-PAC PT "6 Clicks" Mobility  Outcome Measure Help needed turning from your back to your side while in a flat bed without using bedrails?: None Help needed moving from lying on your back to sitting on the side of a flat bed without using bedrails?: None Help needed moving to and from a bed to a chair (including a wheelchair)?: None Help needed standing up from a chair using your arms (e.g., wheelchair or bedside chair)?: None Help needed to walk in hospital room?: None Help needed climbing 3-5 steps with a railing? : A Little 6 Click Score: 23    End of Session Equipment Utilized During Treatment: Gait belt Activity Tolerance: Patient tolerated treatment well Patient left: in bed Nurse Communication: Mobility status PT Visit Diagnosis: Unsteadiness on feet (R26.81);Pain    Time: 4163-8453 PT Time Calculation (min) (ACUTE ONLY): 15 min   Charges:   PT Evaluation $PT Eval Low Complexity: 1 Low          Rolinda Roan, PT, DPT Acute Rehabilitation Services Pager: [phone] Office: [phone]   Thelma Comp 11/19/2018, 9:29 AM

## 2018-11-19 NOTE — Progress Notes (Signed)
Trying to get patient discharged today with wound VAC.  She would be ready for discharge if a home health agency would accept her.  There are concerns about home health with COVID but this patient has had 2 COVID test both negative.  Currently she cannot be discharged until we have home health available for VAC changes.  We reached out to [facility] and to see if they could help and I spoke with several nurses who made contact with home health agencies in an attempt to expedite appropriate care for the patient.  Once home health agency agrees to accept her she can be discharged.

## 2018-11-19 NOTE — Progress Notes (Signed)
   Subjective: 1 Day Post-Op Procedure(s) (LRB): EVACUATION OF LUMBAR SEROMA/HEMATOMA (N/A) Application Of Wound Vac (N/A) Patient reports pain as mild.    Objective: Vital signs in last 24 hours: Temp:  [97.6 F (36.4 C)-98.7 F (37.1 C)] 98.5 F (36.9 C) (08/04 0741) Pulse Rate:  [60-82] 67 (08/04 0741) Resp:  [16-20] 16 (08/04 0741) BP: (138-163)/(81-95) 150/95 (08/04 0741) SpO2:  [91 %-99 %] 94 % (08/04 0741) Weight:  [103.4 kg] 103.4 kg (08/03 1252)  Intake/Output from previous day: 08/03 0701 - 08/04 0700 In: 1123 [P.O.:120; I.V.:1003] Out: 50 [Blood:50] Intake/Output this shift: No intake/output data recorded.  Recent Labs    11/18/18 1333  HGB 13.6   Recent Labs    11/18/18 1333  WBC 8.7  RBC 4.46  HCT 41.7  PLT 327   Recent Labs    11/18/18 1333 11/19/18 0557  NA 134* 138  K 3.8 4.6  CL 105 102  CO2 22 26  BUN 10 10  CREATININE 0.55 0.57  GLUCOSE 106* 105*  CALCIUM 9.2 9.1   No results for input(s): LABPT, INR in the last 72 hours.  Neurologically intact No results found.  Assessment/Plan: 1 Day Post-Op Procedure(s) (LRB): EVACUATION OF LUMBAR SEROMA/HEMATOMA (N/A) Application Of Wound Vac (N/A) Plan:    Gram stain intr-op shows rare gram positive cocci. Office culture from Friday is still not showing any grown. Could go home with VAC if insurance will approve VAC changes by [facility] RN. Will need to know if VAC or [facility] is preferred by her insurance. Plastic adaptor is taped to her chart.   Emily Bender 11/19/2018, 7:56 AM

## 2018-11-19 NOTE — Consult Note (Signed)
   [facility] CM Inpatient Consult   11/19/2018  CHARLENE DETTER November 29, 1965 [id number]    Referral for patient with post op (LRB): EVACUATION OF LUMBAR SEROMA/HEMATOMA (N/A) Application Of Wound Vac (N/A) per MD notes.  Call received from inpatient [facility] team regarding [employer] member post op needs for home health referrals. PCP: Chrismon, Vickki Muff, PA with [place].  Will alert the [facility] Care Management team at [place].   Information regarding contacts at [facility] shield was shared with inpatient [facility] team.  Thanks for the referral.   Natividad Brood, RN BSN [facility] Liaison  [phone] business mobile phone Toll free office [phone]  Fax number: [phone] [name]

## 2018-11-19 NOTE — Discharge Instructions (Signed)
Continue the antibiotics doxycycline 100 mg twice a day 60 tablets sent to your pharmacy.  Prescription for Percocet 20 tablets sent for your pharmacy and you can take them before the [facility] changes that will be 3 times a week.  See Dr. Lorin Mercy in 2 weeks.  If the [facility] nurse who comes out to see you wants me to change her back when you come in to see me in 2 weeks make sure she gives you the extra equipment that she will bring with you so that I can change the VAC and check your wound.  This will allow her to skip 1 home visit for VAC change.

## 2018-11-19 NOTE — Evaluation (Signed)
Occupational Therapy Evaluation Patient Details Name: Emily Bender MRN: [id number] DOB: [birth year] Today's Date: 11/19/2018    History of Present Illness Pt is a 53 y/o female with a PMH significant for left L3-4 hemilaminectomy, microdiscectomy and removal of HNP on 10/30/2018. She presents back s/p I&D of lumbar wound with application of wound VAC.    Clinical Impression   Patient evaluated by Occupational Therapy with no further acute OT needs identified. All education has been completed and the patient has no further questions. See below for any follow-up Occupational Therapy or equipment needs. OT to sign off. Thank you for referral.      Follow Up Recommendations  No OT follow up    Equipment Recommendations  None recommended by OT    Recommendations for Other Services       Precautions / Restrictions Precautions Precautions: Back Precaution Booklet Issued: Yes (comment) Precaution Comments: reviewed back precautions with pt regarding ADLS Restrictions Weight Bearing Restrictions: No      Mobility Bed Mobility Overal bed mobility: Modified Independent             General bed mobility comments: hob slightly elevated and use of rail  Transfers Overall transfer level: Modified independent Equipment used: None             General transfer comment: No assist required. Pt with good posture and proper hand placement on seated surface for safety.     Balance Overall balance assessment: Mild deficits observed, not formally tested                                         ADL either performed or assessed with clinical judgement   ADL Overall ADL's : Modified independent                                       General ADL Comments: able to figure 4 cross. advised sponge bath only until vac is removed. pt educated on precautiosn adn positioning. demonstrates mod I bed mobility     Vision Baseline Vision/History: Wears  glasses Wears Glasses: At all times       Perception     Praxis      Pertinent Vitals/Pain Pain Assessment: Faces Faces Pain Scale: Hurts little more Pain Location: incision and L thigh/groin into hip Pain Descriptors / Indicators: Operative site guarding Pain Intervention(s): Monitored during session;Limited activity within patient's tolerance;Repositioned     Hand Dominance Right   Extremity/Trunk Assessment Upper Extremity Assessment Upper Extremity Assessment: Overall WFL for tasks assessed   Lower Extremity Assessment Lower Extremity Assessment: Defer to PT evaluation   Cervical / Trunk Assessment Cervical / Trunk Assessment: Other exceptions Cervical / Trunk Exceptions: s/p surgery and I&D with wound VAC applied   Communication Communication Communication: No difficulties   Cognition Arousal/Alertness: Awake/alert Behavior During Therapy: WFL for tasks assessed/performed Overall Cognitive Status: Within Functional Limits for tasks assessed                                     General Comments       Exercises     Shoulder Instructions      Home Living Family/patient expects to be discharged to:: Private residence Living Arrangements:  Parent;Children Available Help at Discharge: Family;Available 24 hours/day Type of Home: House Home Access: Stairs to enter [employer] of Steps: 1 Entrance Stairs-Rails: None Home Layout: One level     Bathroom Shower/Tub: Occupational psychologist: Handicapped height(One of each)     Home Equipment: Environmental consultant - 2 wheels;Crutches   Additional Comments: daughter 25 yo is at home all the time. Mother that is 79 and very good shape lives in the home as well. Mother does all the cooking and cleaning      Prior Functioning/Environment Level of Independence: Independent                 OT Problem List:        OT Treatment/Interventions:      OT Goals(Current goals can be found  in the care plan section) Acute Rehab OT Goals Patient Stated Goal: to return home  OT Frequency:     Barriers to D/C:            Co-evaluation              AM-PAC OT "6 Clicks" Daily Activity     Outcome Measure Help from another person eating meals?: None Help from another person taking care of personal grooming?: None Help from another person toileting, which includes using toliet, bedpan, or urinal?: None Help from another person bathing (including washing, rinsing, drying)?: None Help from another person to put on and taking off regular upper body clothing?: None Help from another person to put on and taking off regular lower body clothing?: None 6 Click Score: 24   End of Session Nurse Communication: Mobility status;Precautions  Activity Tolerance: Patient tolerated treatment well Patient left: in bed;with call bell/phone within reach  OT Visit Diagnosis: Pain                Time: 5056-9794 OT Time Calculation (min): 14 min Charges:  OT General Charges $OT Visit: 1 Visit OT Evaluation $OT Eval Low Complexity: 1 Low   Jeri Modena, OTR/L  Acute Rehabilitation Services Pager: [phone] Office: [phone] .   Jeri Modena 11/19/2018, 10:50 AM

## 2018-11-20 DIAGNOSIS — E785 Hyperlipidemia, unspecified: Secondary | ICD-10-CM | POA: Diagnosis present

## 2018-11-20 DIAGNOSIS — Y838 Other surgical procedures as the cause of abnormal reaction of the patient, or of later complication, without mention of misadventure at the time of the procedure: Secondary | ICD-10-CM | POA: Diagnosis present

## 2018-11-20 DIAGNOSIS — E119 Type 2 diabetes mellitus without complications: Secondary | ICD-10-CM | POA: Diagnosis present

## 2018-11-20 DIAGNOSIS — T8141XA Infection following a procedure, superficial incisional surgical site, initial encounter: Secondary | ICD-10-CM | POA: Diagnosis present

## 2018-11-20 DIAGNOSIS — Z9049 Acquired absence of other specified parts of digestive tract: Secondary | ICD-10-CM | POA: Diagnosis not present

## 2018-11-20 DIAGNOSIS — Z833 Family history of diabetes mellitus: Secondary | ICD-10-CM | POA: Diagnosis not present

## 2018-11-20 DIAGNOSIS — F17210 Nicotine dependence, cigarettes, uncomplicated: Secondary | ICD-10-CM | POA: Diagnosis present

## 2018-11-20 DIAGNOSIS — Z882 Allergy status to sulfonamides status: Secondary | ICD-10-CM | POA: Diagnosis not present

## 2018-11-20 DIAGNOSIS — M7989 Other specified soft tissue disorders: Secondary | ICD-10-CM | POA: Diagnosis present

## 2018-11-20 DIAGNOSIS — L7634 Postprocedural seroma of skin and subcutaneous tissue following other procedure: Secondary | ICD-10-CM | POA: Diagnosis present

## 2018-11-20 DIAGNOSIS — Z6835 Body mass index (BMI) 35.0-35.9, adult: Secondary | ICD-10-CM | POA: Diagnosis not present

## 2018-11-20 NOTE — Progress Notes (Signed)
Wound VAC changed at bedside.  Low leak rate.  Next VAC change is Friday.  Efforts ongoing to arrange home health for [facility] changes.

## 2018-11-20 NOTE — Consult Note (Addendum)
[place] Nurse wound consult note Reason for Consult: First post op dressing change to back Wound type:surgical Pressure Injury POA: NA Measurement:2.5cm x 1cm x 5cm Wound YEL:YHTMBP to see.  Bleeding Drainage (amount, consistency, odor) See above Periwound: intact.  Proximal incision is approximated with sutures Dressing procedure/placement/frequency: Bedside RN and I remove dressing that had been reinforced during night. Continuous bleeding from wound. Pressure held for 5 minutes, then 10 minutes. Unable to apply NPWT due to active bleeding. Bedside RN placed call to Dr. Lorin Mercy, transferred call to this writer.  Dr. Lorin Mercy described technique he would like used for dressing application; this writer requested Dr. Lorin Mercy or his PA to see for placement due to bleeding. A pressure dressing is applied and Dr. Lorin Mercy to see later this am.  Supplies for next dressing application x2 left in room along with 3 tincture of benzoin swab sticks.  [place] nursing team will follow, and will remain available to this patient, the nursing and medical teams.   Thanks, Maudie Flakes, MSN, RN, [place], Arther Abbott  Pager# [phone]

## 2018-11-20 NOTE — Progress Notes (Signed)
Call received from [place] NCM pt has been approved for wound vac and will be delivered to pt's bedside today. Whitman Hero RN,BSN,CM

## 2018-11-20 NOTE — Progress Notes (Signed)
NCM has contacted [place] team:  Melanie Crazier UM Nurse Team Lead @ [phone] and [facility] Pod  UM Team Lead @ [phone] regarding assistance with finding home health agency, voice message left for both. Whitman Hero RN,BSN,CM [phone]

## 2018-11-20 NOTE — Progress Notes (Addendum)
NCM received call back from [place] @ [phone]. Jolayne Haines stated she's looking into matter( agencies declining  for home health services) and will f/u with NCM. Whitman Hero RN,BSN,CM [phone]

## 2018-11-20 NOTE — Progress Notes (Signed)
NCM made aware per [place] agency can accept pt for home health services however [facility] will not begin until Sunday,8/9. MD made aware. MD stated pt will need to be seen before Sunday. NCM will continue to monitor and search for TOC needs. Whitman Hero RN,BSN,CM [phone]

## 2018-11-20 NOTE — Progress Notes (Addendum)
NCM received call from [place], [place] @ [phone]. [employer] gave hospital CM contact numbers for  various home health agencies that are in network with pt's insurance ( [facility] [phone], [facility] [phone], [facility]  [phone], [facility] [phone]). NCM called [facility] to make referral and learned agency no longer exist, called [facility] and learned they are not accepting pts with a wound vac.NCM called [facility] and learned they do not staff the [place] area. NCM called [facility] and made referral , awaiting call back for reponse ( acceptance /denial). NCM will continue to monitor/ search ... Angle Landry Mellow RN,BSN,CM

## 2018-11-20 NOTE — Consult Note (Signed)
   [facility] CM Inpatient Consult   11/20/2018  Emily Bender 09/04/1965 [id number]  Follow up:  Spoke with [name], inpatient [facility], in regards to follow up with [facility] agencies for home health care. Spoke with [name] from [facility] unable to staff and with [name] with [facility] and expressed could not staff until Sunday. Information share with [name]. Also, spoke with [name]  at [facility] They are to follow up with inpatient TOC, [name].  Received a call from [name] stating that [name] is unable to take the referral. Spoke with [name] who states she has a list from [name] she is contacting, as well.  For questions, contact:  Natividad Brood, RN BSN [facility] Liaison  [phone] business mobile phone Toll free office [phone]  Fax number: [phone] [name]

## 2018-11-20 NOTE — Progress Notes (Signed)
NCM made aware from [facility] this am they are unable to accept pt for home health services.NCM will continue to search... Whitman Hero RN,BSN,CM [phone]

## 2018-11-21 LAB — ANAEROBIC AND AEROBIC CULTURE
MICRO NUMBER:: 725015
MICRO NUMBER:: 725016
SPECIMEN QUALITY:: ADEQUATE
SPECIMEN QUALITY:: ADEQUATE

## 2018-11-21 MED ORDER — DOXYCYCLINE HYCLATE 100 MG PO TABS
100.0000 mg | ORAL_TABLET | Freq: Two times a day (BID) | ORAL | Status: DC
  Administered 2018-11-21 – 2018-11-22 (×3): 100 mg via ORAL
  Filled 2018-11-21 (×4): qty 1

## 2018-11-21 NOTE — Progress Notes (Signed)
NCM has made multi attempts with various home health  agencies, along with soliciting help from insurance CM however no agency accepted.  Pt can d/c after vac drsg change on Fri with [facility] to provide home health services. Start of care will begin  Mon 8/10 which will be the next time for vac drsg change. NCM made bedside nurse aware. Whitman Hero RN,BSN,CM

## 2018-11-21 NOTE — Progress Notes (Signed)
Patient ID: Emily Bender, female   DOB: [birth year], 53 y.o.   MRN: [id number] Patient was seen by me this morning.  She was doing well.  Pain controlled.  We are waiting to see if home health RN can provide wound VAC changes after patient discharges.  Exam very pleasant white female alert and oriented in no acute distress.  Wound VAC intact.  Patient is neurovascular intact.  No focal motor deficits.  Plan Hopefully patient can be discharged home tomorrow.  Continue present care.

## 2018-11-22 ENCOUNTER — Other Ambulatory Visit: Payer: Self-pay | Admitting: Family Medicine

## 2018-11-22 DIAGNOSIS — M5416 Radiculopathy, lumbar region: Secondary | ICD-10-CM

## 2018-11-22 NOTE — Progress Notes (Signed)
Patient is discharged from room 3C07 at this time. Alert and in stable condition. IV site d/c'd and instructions read to patient with understanding verbalized. Left unit via wheelchair with all belongings at side. 

## 2018-11-22 NOTE — Progress Notes (Signed)
Patient ID: Emily Bender, female   DOB: November 26, 1965, 53 y.o.   MRN: [id number]   Patient doing well.  No complaints.  Home health arranged for wound VAC change on Sunday.  Patient will follow-up with [place] in 1 week.  Discharge home this afternoon.  Scripts for doxycycline and Percocet sent to the pharmacy.

## 2018-11-22 NOTE — Consult Note (Addendum)
[facility] Nurse wound consult note Reason for Consult: Vac dressing changed to back wound Wound type: Full thickness post-op wound; refer to progress notes on 8/5 for measurements.  Beefy red, no further bleeding noted.  Drainage (amount, consistency, odor) 100cc pink liquid in the cannister Periwound: Sutures well-approximated surrounding the wound Dressing procedure/placement/frequency: Pt was medicated for pain prior to the procedure and tolerated with minimal amt discomfort.  Applied one piece white foam and one piece black foam to cont suction at 166mm.  Attached patient to portable Vac machine in the room in preparation for discharge later today.  Reviewed troubleshooting techniques and alarms.  Box of supplies at the bedside for home health use. Please re-consult if further assistance is needed.  Thank-you,  Julien Girt MSN, [place], [place], [place], [place]

## 2018-11-23 LAB — AEROBIC/ANAEROBIC CULTURE W GRAM STAIN (SURGICAL/DEEP WOUND)

## 2018-11-25 ENCOUNTER — Other Ambulatory Visit: Payer: Self-pay

## 2018-11-25 DIAGNOSIS — F1290 Cannabis use, unspecified, uncomplicated: Secondary | ICD-10-CM | POA: Diagnosis not present

## 2018-11-25 DIAGNOSIS — M1990 Unspecified osteoarthritis, unspecified site: Secondary | ICD-10-CM | POA: Diagnosis not present

## 2018-11-25 DIAGNOSIS — Z981 Arthrodesis status: Secondary | ICD-10-CM | POA: Diagnosis not present

## 2018-11-25 DIAGNOSIS — F1099 Alcohol use, unspecified with unspecified alcohol-induced disorder: Secondary | ICD-10-CM | POA: Diagnosis not present

## 2018-11-25 DIAGNOSIS — Z792 Long term (current) use of antibiotics: Secondary | ICD-10-CM | POA: Diagnosis not present

## 2018-11-25 DIAGNOSIS — F419 Anxiety disorder, unspecified: Secondary | ICD-10-CM | POA: Diagnosis not present

## 2018-11-25 DIAGNOSIS — Z9049 Acquired absence of other specified parts of digestive tract: Secondary | ICD-10-CM | POA: Diagnosis not present

## 2018-11-25 DIAGNOSIS — Z1159 Encounter for screening for other viral diseases: Secondary | ICD-10-CM | POA: Diagnosis not present

## 2018-11-25 DIAGNOSIS — E785 Hyperlipidemia, unspecified: Secondary | ICD-10-CM | POA: Diagnosis not present

## 2018-11-25 DIAGNOSIS — F329 Major depressive disorder, single episode, unspecified: Secondary | ICD-10-CM | POA: Diagnosis not present

## 2018-11-25 DIAGNOSIS — Z79891 Long term (current) use of opiate analgesic: Secondary | ICD-10-CM | POA: Diagnosis not present

## 2018-11-25 DIAGNOSIS — T8149XA Infection following a procedure, other surgical site, initial encounter: Secondary | ICD-10-CM | POA: Diagnosis not present

## 2018-11-25 DIAGNOSIS — F17210 Nicotine dependence, cigarettes, uncomplicated: Secondary | ICD-10-CM | POA: Diagnosis not present

## 2018-11-25 DIAGNOSIS — M48061 Spinal stenosis, lumbar region without neurogenic claudication: Secondary | ICD-10-CM | POA: Diagnosis not present

## 2018-11-25 NOTE — Consult Note (Signed)
Late entry:  8/7/202 1440:  Spoke with patient via hospital room phone regarding [place] [facility] referral for assistance with finding home health for wound care needs while working with inpatient [facility] team.  Patient states she would like for [place] Management for post hospital follow up to "see how things are going".  Explained that her primary care provides the transition of care and will notified the [facility] CM of her transition planned for home with home health and wound VAC.   For questions, please contact:  Natividad Brood, RN BSN [facility] Liaison  [phone] business mobile phone Toll free office [phone]  Fax number: [phone] [name]

## 2018-11-26 ENCOUNTER — Encounter: Payer: Self-pay | Admitting: *Deleted

## 2018-11-26 ENCOUNTER — Telehealth: Payer: Self-pay | Admitting: Orthopaedic Surgery

## 2018-11-26 ENCOUNTER — Other Ambulatory Visit: Payer: Self-pay | Admitting: Family Medicine

## 2018-11-26 ENCOUNTER — Other Ambulatory Visit: Payer: Self-pay | Admitting: *Deleted

## 2018-11-26 DIAGNOSIS — F32A Depression, unspecified: Secondary | ICD-10-CM

## 2018-11-26 DIAGNOSIS — F329 Major depressive disorder, single episode, unspecified: Secondary | ICD-10-CM

## 2018-11-26 NOTE — Patient Outreach (Signed)
[facility]) Care Management  11/26/2018  Emily Bender 04/20/1965 [id number]   Telephone assessment  Transition of care by PCP office, [facility] care management office.   Referral received : 8/10 Referral source; [facility] hospital Liasion Referral reason : post hospital follow up    Patient is 53 year old female with recent hospital admission at [facility]  8/3-8/7 for evacuation of lumbar seroma/hematoma and VAC placement.   PMHX : includes s/p left L3 &L4 hemilaminectomy removal of herniated nucleus pulposus on 10/30/18. Chronic condition of depression, anxiety.    Subjective  Initial outreach call to patient, explained reason for the call , HIPAA verified x 2 identifiers . Patient able to recall conversation with hospital liaison regarding post hospital follow up calls and giving verbal agreement.   Patient reports feeling good, she discussed being a little bored due to not being able to be as active.   Conditions  Evacuation of Lumbar seroma/hematoma and wound vac ,placment .  She discussed home health follow up with [facility] and wonderful experience with wound vac dressing change . Patient is hopeful for short term use of wound vac and that area will heal soon.  Patient reports pain control , tolerating activity in home and reports does better walking outside than around the home. She discussed history of having bad ankle and knees, denies having a fall and does not need of assistive device. Patient reports tolerating diet, states her mother makes sure she gets good nutritious and protein in meals.  including yogurt in meal plan twice daily , due to being on antibiotics for a while.   Social Patient lives at home with her daughter and his mother,she discussed having good support. Her mother prepares meals , patient daughter or her mother will be able to provide transportation. Patient eager to be able and attend drive up church service and socially distancing.    Medication Taking medications as prescribed, no cost concerns. She continuing to take prescribed antibiotics.   Appointments Patient has follow with Dr.Yates on 8/14, she is aware to bring vac dressing to replaced after Dr.Yates reviewed site in office .   Advanced Directive Patient does not have an Advanced Directive, reviewed basic information on Advanced Directive on how to complete process.    Plan Will send patient Advanced Directive EMMI and handout Will send patient welcome letter, Will send PCP barrier letter .  Will plan return call in the next 3 weeks   [facility] CM Care Plan Problem One     Most Recent Value  Care Plan Problem One  Impaired wound healing related to  hospital [facility] for surgery and wound vac placed ,   Role Documenting the Problem One  Care Management [place] for Problem One  Active  [facility] CM Short Term Goal #1   Over the next 30 days patient will be able to report wound healing as evidenced by wound vac being removed.   [facility] CM Short Term Goal #1 Start Date  11/26/18  Interventions for Short Term Goal #1  Discussed signs of infections to notify MD of increased pain at site any noted drainage , elevation in temperature to notify MD Of   [facility] CM Short Term Goal #2   Over the next 24 days patient will attend all medical appointments   [facility] CM Short Term Goal #2 Start Date  11/26/18  Interventions for Short Term Goal #2  Discussed upcoming medical appointment and verified that she has  supplies requested to take to appointment for dressing changes, confirmed transporation.       Joylene Draft, RN, [place] Management Coordinator  [phone]- Mobile [phone]- Toll Free Main Office

## 2018-11-26 NOTE — Telephone Encounter (Signed)
Can you please add her to Dr Lorin Mercy Friday afternoon schedule? Thanks.

## 2018-11-26 NOTE — Telephone Encounter (Signed)
See below

## 2018-11-26 NOTE — Telephone Encounter (Signed)
Deana with [facility] called in to confirm that dr.yates will be the doctor signing off on all future orders for this pt.   [phone]

## 2018-11-26 NOTE — Telephone Encounter (Signed)
I called and discussed.  Home health said she had an appointment on Thursday but she will need to make the appointment on Friday so that I can remove the [facility] check the wound and then reapply the VAC.  Home health RN will make sure that patient brings a small VAC package with her so that I can reapply it after I remove the VAC to check her wound.  I am in the office Friday afternoon she can see me then.  Patient will be calling to get an appointment time on Friday afternoon.  Or you could call the patient and give her an appointment time.  Thank you

## 2018-11-27 ENCOUNTER — Inpatient Hospital Stay: Payer: BC Managed Care – PPO | Admitting: Orthopaedic Surgery

## 2018-11-27 DIAGNOSIS — F419 Anxiety disorder, unspecified: Secondary | ICD-10-CM | POA: Diagnosis not present

## 2018-11-27 DIAGNOSIS — M48061 Spinal stenosis, lumbar region without neurogenic claudication: Secondary | ICD-10-CM | POA: Diagnosis not present

## 2018-11-27 DIAGNOSIS — Z79891 Long term (current) use of opiate analgesic: Secondary | ICD-10-CM | POA: Diagnosis not present

## 2018-11-27 DIAGNOSIS — T8149XA Infection following a procedure, other surgical site, initial encounter: Secondary | ICD-10-CM | POA: Diagnosis not present

## 2018-11-27 DIAGNOSIS — E785 Hyperlipidemia, unspecified: Secondary | ICD-10-CM | POA: Diagnosis not present

## 2018-11-27 DIAGNOSIS — Z9049 Acquired absence of other specified parts of digestive tract: Secondary | ICD-10-CM | POA: Diagnosis not present

## 2018-11-27 DIAGNOSIS — M1990 Unspecified osteoarthritis, unspecified site: Secondary | ICD-10-CM | POA: Diagnosis not present

## 2018-11-27 DIAGNOSIS — Z981 Arthrodesis status: Secondary | ICD-10-CM | POA: Diagnosis not present

## 2018-11-27 DIAGNOSIS — F329 Major depressive disorder, single episode, unspecified: Secondary | ICD-10-CM | POA: Diagnosis not present

## 2018-11-27 DIAGNOSIS — F17210 Nicotine dependence, cigarettes, uncomplicated: Secondary | ICD-10-CM | POA: Diagnosis not present

## 2018-11-27 DIAGNOSIS — Z792 Long term (current) use of antibiotics: Secondary | ICD-10-CM | POA: Diagnosis not present

## 2018-11-27 DIAGNOSIS — F1290 Cannabis use, unspecified, uncomplicated: Secondary | ICD-10-CM | POA: Diagnosis not present

## 2018-11-27 DIAGNOSIS — F1099 Alcohol use, unspecified with unspecified alcohol-induced disorder: Secondary | ICD-10-CM | POA: Diagnosis not present

## 2018-11-27 DIAGNOSIS — Z1159 Encounter for screening for other viral diseases: Secondary | ICD-10-CM | POA: Diagnosis not present

## 2018-11-27 NOTE — Discharge Summary (Signed)
Patient ID: Emily Bender MRN: [id number] DOB/AGE: 1965-06-05 53 y.o.  Admit date: 11/18/2018 Discharge date: 11/27/2018  Admission Diagnoses:  Active Problems:   Wound infection   Discharge Diagnoses:  Active Problems:   Wound infection  status post Procedure(s): EVACUATION OF LUMBAR SEROMA/HEMATOMA Application Of Wound Vac  Past Medical History:  Diagnosis Date  . Anxiety   . Arthritis   . Depression   . Hyperlipidemia   . Lumbar stenosis     Surgeries: Procedure(s): EVACUATION OF LUMBAR SEROMA/HEMATOMA Application Of Wound Vac on 11/18/2018   Consultants:   Discharged Condition: Improved  Hospital Course: Emily Bender is an 53 y.o. female who was admitted 11/18/2018 for operative treatment of postop lumbar wound infection. Patient failed conservative treatments (please see the history and physical for the specifics) and had severe unremitting pain that affects sleep, daily activities and work/hobbies. After pre-op clearance, the patient was taken to the operating room on 11/18/2018 and underwent  Procedure(s): EVACUATION OF LUMBAR SEROMA/HEMATOMA Application Of Wound Vac.    Patient was given perioperative antibiotics:  Anti-infectives (From admission, onward)   Start     Dose/Rate Route Frequency Ordered Stop   11/21/18 1500  doxycycline (VIBRA-TABS) tablet 100 mg  Status:  Discontinued     100 mg Oral Every 12 hours 11/21/18 1449 11/22/18 2007   11/19/18 0000  doxycycline (VIBRAMYCIN) 50 MG capsule     100 mg Oral 2 times daily 11/19/18 1228     11/18/18 2300  ceFAZolin (ANCEF) IVPB 1 g/50 mL premix     1 g 100 mL/hr over 30 Minutes Intravenous Every 8 hours 11/18/18 1652 11/21/18 1627   11/18/18 1447  ceFAZolin (ANCEF) 2-4 GM/100ML-% IVPB    Note to Pharmacy: Tamala Fothergill   : cabinet override      11/18/18 1447 11/19/18 0259       Patient was given sequential compression devices and early ambulation to prevent DVT.   Patient benefited maximally from  hospital stay and there were no complications. At the time of discharge, the patient was urinating/moving their bowels without difficulty, tolerating a regular diet, pain is controlled with oral pain medications and they have been cleared by PT/OT.   Recent vital signs: No data found.   Recent laboratory studies: No results for input(s): WBC, HGB, HCT, PLT, NA, K, CL, CO2, BUN, CREATININE, GLUCOSE, INR, CALCIUM in the last 72 hours.  Invalid input(s): PT, 2   Discharge Medications:   Allergies as of 11/22/2018      Reactions   Sulfa Antibiotics Hives      Medication List    STOP taking these medications   acetaminophen 500 MG tablet Commonly known as: TYLENOL   aspirin-acetaminophen-caffeine 250-250-65 MG tablet Commonly known as: EXCEDRIN MIGRAINE   doxycycline 100 MG tablet Commonly known as: VIBRA-TABS Replaced by: doxycycline 50 MG capsule   methylPREDNISolone 4 MG tablet Commonly known as: Medrol     TAKE these medications   doxycycline 50 MG capsule Commonly known as: VIBRAMYCIN Take 2 capsules (100 mg total) by mouth 2 (two) times daily. Replaces: doxycycline 100 MG tablet   meloxicam 7.5 MG tablet Commonly known as: MOBIC Take 1 tablet (7.5 mg total) by mouth 2 (two) times daily. What changed:   how much to take  when to take this   oxyCODONE-acetaminophen 5-325 MG tablet Commonly known as: Percocet Take 1-2 tablets by mouth every 6 (six) hours as needed for severe pain. Post op lumbar pain 11/18/18 surgery  What changed:   when to take this  additional instructions  Another medication with the same name was removed. Continue taking this medication, and follow the directions you see here.   sertraline 100 MG tablet Commonly known as: ZOLOFT TAKE ONE TABLET BY MOUTH EVERY DAY AT BEDTIME What changed:   how much to take  how to take this  when to take this  additional instructions     ASK your doctor about these medications   gabapentin 300 MG  capsule Commonly known as: NEURONTIN TAKE 1 CAPSULE BY MOUTH 3 TIMES DAILY Ask about: Which instructions should I use?       Diagnostic Studies: Dg Lumbar Spine 2-3 Views  Result Date: 10/30/2018 CLINICAL DATA:  L3 and L4 hemilaminectomy EXAM: LUMBAR SPINE - 2-3 VIEW COMPARISON:  None. FINDINGS: Transitional anatomy at the lumbosacral junction. Numbering follows the prior numbering on MRI. First lateral intraoperative image demonstrates posterior needles are directed at the L4 pedicles and inferior L4 vertebral body. Second image demonstrates posterior surgical instruments directed at the L4 and L5 vertebral bodies. : Intraoperative localization as above. Electronically Signed   By: Rolm Baptise M.D.   On: 10/30/2018 18:00      Follow-up Information    Marybelle Killings, MD Follow up in 2 week(s).   Specialty: Orthopedic Surgery Contact information: [place] [address] [phone]           Discharge Plan:  discharge to home  Disposition:     Signed: Benjiman Core  11/27/2018, 9:00 AM

## 2018-11-29 ENCOUNTER — Encounter: Payer: Self-pay | Admitting: Orthopaedic Surgery

## 2018-11-29 ENCOUNTER — Ambulatory Visit (INDEPENDENT_AMBULATORY_CARE_PROVIDER_SITE_OTHER): Payer: BC Managed Care – PPO | Admitting: Orthopaedic Surgery

## 2018-11-29 DIAGNOSIS — M5126 Other intervertebral disc displacement, lumbar region: Secondary | ICD-10-CM

## 2018-11-29 DIAGNOSIS — L089 Local infection of the skin and subcutaneous tissue, unspecified: Secondary | ICD-10-CM

## 2018-11-29 DIAGNOSIS — T148XXA Other injury of unspecified body region, initial encounter: Secondary | ICD-10-CM

## 2018-11-29 NOTE — Progress Notes (Signed)
Post-Op Visit Note   Patient: Emily Bender           Date of Birth: 1965-07-04           MRN: [id number] Visit Date: 11/29/2018 PCP: Margo Common, PA   Assessment & Plan: Postop lumbar discectomy for migrated fragment herniated from L4-5 migrated up almost L3-4 disc space level.  Surgery date 10/30/2018.  She re-presented to the office 2 weeks later with drainage inferior aspect of her wound with seroma.  Cultures in the office including Gram stain aerobic and anaerobic were negative.  Lab work for infection was unremarkable.  Patient did have low albumin at 3.6, smoker, obesity.  Today she returns and VAC was removed new VAC applied.  She has 3 sutures proximally reapproximating the skin which look good no cellulitis.  Granulation tissue is present at the base.  Continue VAC changing by home health and I plan to recheck her in 2 weeks on a Thursday in [facility] which is more convenient for her.  Intra-Op cultures after wound is been open with some drainage 24 to 48 hours grew staph Lugdunesis which was sensitive to every antibiotic tested.  Patient has been on doxycycline which she continues.  Recheck 2 weeks for VAC change.  She denies fever denies chills good relief her preop leg pain and weakness from her disc herniation.  Wound is granulating she should have satisfactory improvement.  Return 2 weeks we can discuss work resumption.  She understands she will have the VAC likely for several more weeks.  Chief Complaint:  Chief Complaint  Patient presents with  . Lower Back - Routine Post Op, Wound Check   Visit Diagnoses:  1. Herniated nucleus pulposus, lumbar   2. Wound infection          Post op seroma   Plan: Continue VAC.  New VAC applied today with low leak rate.  Due to her body habitus the depth was more than 7 cm and it will take a while for the granulation tissue to heal this up.  Recheck 2 weeks she will bring back supply so I can change it at the time and check the  wound.  Follow-Up Instructions: No follow-ups on file.   Orders:  No orders of the defined types were placed in this encounter.  No orders of the defined types were placed in this encounter.   Imaging: No results found.  PMFS History: Patient Active Problem List   Diagnosis Date Noted  . Wound infection 11/18/2018  . Lumbar stenosis 10/30/2018  . Herniated nucleus pulposus, lumbar 10/30/2018  . Spinal stenosis of lumbar region 10/16/2018  . Lumbar herniated disc 10/14/2018  . Alcohol drinker 05/20/2015  . Anxiety and depression 05/20/2015  . History of artificial joint 05/20/2015  . HLD (hyperlipidemia) 05/20/2015  . Avitaminosis D 05/20/2015  . Chronic cough 01/20/2014  . Adiposity 01/18/2012  . Irregular bleeding 11/27/2006  . Tobacco use 06/23/2005   Past Medical History:  Diagnosis Date  . Anxiety   . Arthritis   . Depression   . Hyperlipidemia   . Lumbar stenosis     Family History  Problem Relation Age of Onset  . Lung cancer Father   . Healthy Brother   . Aneurysm Maternal Grandmother   . Lung cancer Maternal Grandfather   . Coronary artery disease Paternal Grandmother   . Diabetes Mother   . Hypertension Mother     Past Surgical History:  Procedure Laterality  Date  . ANKLE SURGERY  01/17/2012   x3  . APPLICATION OF WOUND VAC N/A 11/18/2018   Procedure: Application Of Wound Vac;  Surgeon: Marybelle Killings, MD;  Location: [place];  Service: Orthopedics;  Laterality: N/A;  . BACK SURGERY    . BREAST BIOPSY Left 1990's  . CHOLECYSTECTOMY  2000  . I&D EXTREMITY N/A 11/18/2018   Procedure: EVACUATION OF LUMBAR SEROMA/HEMATOMA;  Surgeon: Marybelle Killings, MD;  Location: [place];  Service: Orthopedics;  Laterality: N/A;  . LAMINECTOMY  1996  . LUMBAR LAMINECTOMY N/A 10/30/2018   Procedure: left L3 & L4 hemilaminectomy, removal of herniated nucleus pulposus;  Surgeon: Marybelle Killings, MD;  Location: [place];  Service: Orthopedics;  Laterality: N/A;  . WRIST SURGERY Right     Social History   Occupational History  . Not on file  Tobacco Use  . Smoking status: Current Every Day Smoker    Packs/day: 0.25    Years: 20.00    Pack years: 5.00    Types: Cigarettes  . Smokeless tobacco: Never Used  Substance and Sexual Activity  . Alcohol use: Yes    Alcohol/week: 6.0 standard drinks    Types: 6 Cans of beer per week  . Drug use: Yes    Frequency: 7.0 times per week    Types: Marijuana    Comment: last use 10/22/2018  . Sexual activity: Not on file

## 2018-12-02 DIAGNOSIS — F419 Anxiety disorder, unspecified: Secondary | ICD-10-CM | POA: Diagnosis not present

## 2018-12-02 DIAGNOSIS — Z9049 Acquired absence of other specified parts of digestive tract: Secondary | ICD-10-CM | POA: Diagnosis not present

## 2018-12-02 DIAGNOSIS — F1290 Cannabis use, unspecified, uncomplicated: Secondary | ICD-10-CM | POA: Diagnosis not present

## 2018-12-02 DIAGNOSIS — T8149XA Infection following a procedure, other surgical site, initial encounter: Secondary | ICD-10-CM | POA: Diagnosis not present

## 2018-12-02 DIAGNOSIS — F1099 Alcohol use, unspecified with unspecified alcohol-induced disorder: Secondary | ICD-10-CM | POA: Diagnosis not present

## 2018-12-02 DIAGNOSIS — F329 Major depressive disorder, single episode, unspecified: Secondary | ICD-10-CM | POA: Diagnosis not present

## 2018-12-02 DIAGNOSIS — Z1159 Encounter for screening for other viral diseases: Secondary | ICD-10-CM | POA: Diagnosis not present

## 2018-12-02 DIAGNOSIS — Z792 Long term (current) use of antibiotics: Secondary | ICD-10-CM | POA: Diagnosis not present

## 2018-12-02 DIAGNOSIS — M48061 Spinal stenosis, lumbar region without neurogenic claudication: Secondary | ICD-10-CM | POA: Diagnosis not present

## 2018-12-02 DIAGNOSIS — F17210 Nicotine dependence, cigarettes, uncomplicated: Secondary | ICD-10-CM | POA: Diagnosis not present

## 2018-12-02 DIAGNOSIS — Z981 Arthrodesis status: Secondary | ICD-10-CM | POA: Diagnosis not present

## 2018-12-02 DIAGNOSIS — M1990 Unspecified osteoarthritis, unspecified site: Secondary | ICD-10-CM | POA: Diagnosis not present

## 2018-12-02 DIAGNOSIS — Z79891 Long term (current) use of opiate analgesic: Secondary | ICD-10-CM | POA: Diagnosis not present

## 2018-12-02 DIAGNOSIS — E785 Hyperlipidemia, unspecified: Secondary | ICD-10-CM | POA: Diagnosis not present

## 2018-12-03 ENCOUNTER — Other Ambulatory Visit: Payer: Self-pay | Admitting: Family Medicine

## 2018-12-03 DIAGNOSIS — F32A Depression, unspecified: Secondary | ICD-10-CM

## 2018-12-03 DIAGNOSIS — F329 Major depressive disorder, single episode, unspecified: Secondary | ICD-10-CM

## 2018-12-04 DIAGNOSIS — F419 Anxiety disorder, unspecified: Secondary | ICD-10-CM | POA: Diagnosis not present

## 2018-12-04 DIAGNOSIS — F17210 Nicotine dependence, cigarettes, uncomplicated: Secondary | ICD-10-CM | POA: Diagnosis not present

## 2018-12-04 DIAGNOSIS — Z9049 Acquired absence of other specified parts of digestive tract: Secondary | ICD-10-CM | POA: Diagnosis not present

## 2018-12-04 DIAGNOSIS — T8149XA Infection following a procedure, other surgical site, initial encounter: Secondary | ICD-10-CM | POA: Diagnosis not present

## 2018-12-04 DIAGNOSIS — F1290 Cannabis use, unspecified, uncomplicated: Secondary | ICD-10-CM | POA: Diagnosis not present

## 2018-12-04 DIAGNOSIS — Z792 Long term (current) use of antibiotics: Secondary | ICD-10-CM | POA: Diagnosis not present

## 2018-12-04 DIAGNOSIS — F329 Major depressive disorder, single episode, unspecified: Secondary | ICD-10-CM | POA: Diagnosis not present

## 2018-12-04 DIAGNOSIS — F1099 Alcohol use, unspecified with unspecified alcohol-induced disorder: Secondary | ICD-10-CM | POA: Diagnosis not present

## 2018-12-04 DIAGNOSIS — M1712 Unilateral primary osteoarthritis, left knee: Secondary | ICD-10-CM | POA: Diagnosis not present

## 2018-12-04 DIAGNOSIS — E785 Hyperlipidemia, unspecified: Secondary | ICD-10-CM | POA: Diagnosis not present

## 2018-12-04 DIAGNOSIS — Z981 Arthrodesis status: Secondary | ICD-10-CM | POA: Diagnosis not present

## 2018-12-04 DIAGNOSIS — M48061 Spinal stenosis, lumbar region without neurogenic claudication: Secondary | ICD-10-CM | POA: Diagnosis not present

## 2018-12-04 DIAGNOSIS — M1990 Unspecified osteoarthritis, unspecified site: Secondary | ICD-10-CM | POA: Diagnosis not present

## 2018-12-04 DIAGNOSIS — Z1159 Encounter for screening for other viral diseases: Secondary | ICD-10-CM | POA: Diagnosis not present

## 2018-12-04 DIAGNOSIS — Z79891 Long term (current) use of opiate analgesic: Secondary | ICD-10-CM | POA: Diagnosis not present

## 2018-12-05 ENCOUNTER — Other Ambulatory Visit: Payer: Self-pay

## 2018-12-05 ENCOUNTER — Inpatient Hospital Stay: Payer: BC Managed Care – PPO | Admitting: Orthopaedic Surgery

## 2018-12-06 DIAGNOSIS — F17210 Nicotine dependence, cigarettes, uncomplicated: Secondary | ICD-10-CM | POA: Diagnosis not present

## 2018-12-06 DIAGNOSIS — Z9049 Acquired absence of other specified parts of digestive tract: Secondary | ICD-10-CM | POA: Diagnosis not present

## 2018-12-06 DIAGNOSIS — E785 Hyperlipidemia, unspecified: Secondary | ICD-10-CM | POA: Diagnosis not present

## 2018-12-06 DIAGNOSIS — T8149XA Infection following a procedure, other surgical site, initial encounter: Secondary | ICD-10-CM | POA: Diagnosis not present

## 2018-12-06 DIAGNOSIS — Z792 Long term (current) use of antibiotics: Secondary | ICD-10-CM | POA: Diagnosis not present

## 2018-12-06 DIAGNOSIS — F329 Major depressive disorder, single episode, unspecified: Secondary | ICD-10-CM | POA: Diagnosis not present

## 2018-12-06 DIAGNOSIS — F1290 Cannabis use, unspecified, uncomplicated: Secondary | ICD-10-CM | POA: Diagnosis not present

## 2018-12-06 DIAGNOSIS — F1099 Alcohol use, unspecified with unspecified alcohol-induced disorder: Secondary | ICD-10-CM | POA: Diagnosis not present

## 2018-12-06 DIAGNOSIS — Z1159 Encounter for screening for other viral diseases: Secondary | ICD-10-CM | POA: Diagnosis not present

## 2018-12-06 DIAGNOSIS — M48061 Spinal stenosis, lumbar region without neurogenic claudication: Secondary | ICD-10-CM | POA: Diagnosis not present

## 2018-12-06 DIAGNOSIS — M1990 Unspecified osteoarthritis, unspecified site: Secondary | ICD-10-CM | POA: Diagnosis not present

## 2018-12-06 DIAGNOSIS — F419 Anxiety disorder, unspecified: Secondary | ICD-10-CM | POA: Diagnosis not present

## 2018-12-06 DIAGNOSIS — Z79891 Long term (current) use of opiate analgesic: Secondary | ICD-10-CM | POA: Diagnosis not present

## 2018-12-06 DIAGNOSIS — Z981 Arthrodesis status: Secondary | ICD-10-CM | POA: Diagnosis not present

## 2018-12-09 DIAGNOSIS — Z9049 Acquired absence of other specified parts of digestive tract: Secondary | ICD-10-CM | POA: Diagnosis not present

## 2018-12-09 DIAGNOSIS — F419 Anxiety disorder, unspecified: Secondary | ICD-10-CM | POA: Diagnosis not present

## 2018-12-09 DIAGNOSIS — F329 Major depressive disorder, single episode, unspecified: Secondary | ICD-10-CM | POA: Diagnosis not present

## 2018-12-09 DIAGNOSIS — Z792 Long term (current) use of antibiotics: Secondary | ICD-10-CM | POA: Diagnosis not present

## 2018-12-09 DIAGNOSIS — Z1159 Encounter for screening for other viral diseases: Secondary | ICD-10-CM | POA: Diagnosis not present

## 2018-12-09 DIAGNOSIS — T8149XA Infection following a procedure, other surgical site, initial encounter: Secondary | ICD-10-CM | POA: Diagnosis not present

## 2018-12-09 DIAGNOSIS — E785 Hyperlipidemia, unspecified: Secondary | ICD-10-CM | POA: Diagnosis not present

## 2018-12-09 DIAGNOSIS — M1990 Unspecified osteoarthritis, unspecified site: Secondary | ICD-10-CM | POA: Diagnosis not present

## 2018-12-09 DIAGNOSIS — F17210 Nicotine dependence, cigarettes, uncomplicated: Secondary | ICD-10-CM | POA: Diagnosis not present

## 2018-12-09 DIAGNOSIS — M48061 Spinal stenosis, lumbar region without neurogenic claudication: Secondary | ICD-10-CM | POA: Diagnosis not present

## 2018-12-09 DIAGNOSIS — F1099 Alcohol use, unspecified with unspecified alcohol-induced disorder: Secondary | ICD-10-CM | POA: Diagnosis not present

## 2018-12-09 DIAGNOSIS — Z79891 Long term (current) use of opiate analgesic: Secondary | ICD-10-CM | POA: Diagnosis not present

## 2018-12-09 DIAGNOSIS — F1290 Cannabis use, unspecified, uncomplicated: Secondary | ICD-10-CM | POA: Diagnosis not present

## 2018-12-09 DIAGNOSIS — Z981 Arthrodesis status: Secondary | ICD-10-CM | POA: Diagnosis not present

## 2018-12-10 ENCOUNTER — Other Ambulatory Visit: Payer: Self-pay | Admitting: *Deleted

## 2018-12-10 NOTE — Patient Outreach (Signed)
[facility]) Care Management  12/10/2018  DECKLYN SENTER 1965-10-11 [id number]   Telephone follow up call   Referral received : 8/10 Referral source; [place] Referral reason : post hospital follow up    Patient is 53 year old female with recent hospital admission at [facility]  8/3-8/7 for evacuation of lumbar seroma/hematoma and VAC placement.   PMHX : includes s/p left L3 &L4 hemilaminectomy removal of herniated nucleus pulposus on 10/30/18. Chronic condition of depression, anxiety.    Subjective  Successful outreach call to patient , she reports that she is doing well. She discussed slow healing with wound, home health RN visit on yesterday for wound vac dressing change, patient states she realizes it may take a while for area to heal and she may be wearing VAC a little longer.  Patient verifies having supplies on hand dressing changes at home, discussed bag holding vac device is worn and she is calling [employer] for a new bag on today.  Patient discussed tolerating activity in home, doing light household chores. She has been given to okay to drive and is tolerating that well. Patient discussed pain in controlled with her current medication plan.  Discussed nutrition role in healing and benefits of balanced meals and adequate protein intake patient reports having a good appetite.   Patient denies any new concerns at this time agreeable to return call in the next 2 weeks and case closure if no new concerns.    Plan Will schedule return call in the next 2 weeks and case closure if no new concerns.    Joylene Draft, RN, [place] Management Coordinator  [phone]- Mobile [phone]- Toll Free Main Office

## 2018-12-11 DIAGNOSIS — Z79891 Long term (current) use of opiate analgesic: Secondary | ICD-10-CM | POA: Diagnosis not present

## 2018-12-11 DIAGNOSIS — M1990 Unspecified osteoarthritis, unspecified site: Secondary | ICD-10-CM | POA: Diagnosis not present

## 2018-12-11 DIAGNOSIS — F17210 Nicotine dependence, cigarettes, uncomplicated: Secondary | ICD-10-CM | POA: Diagnosis not present

## 2018-12-11 DIAGNOSIS — Z1159 Encounter for screening for other viral diseases: Secondary | ICD-10-CM | POA: Diagnosis not present

## 2018-12-11 DIAGNOSIS — F329 Major depressive disorder, single episode, unspecified: Secondary | ICD-10-CM | POA: Diagnosis not present

## 2018-12-11 DIAGNOSIS — F1099 Alcohol use, unspecified with unspecified alcohol-induced disorder: Secondary | ICD-10-CM | POA: Diagnosis not present

## 2018-12-11 DIAGNOSIS — Z981 Arthrodesis status: Secondary | ICD-10-CM | POA: Diagnosis not present

## 2018-12-11 DIAGNOSIS — E785 Hyperlipidemia, unspecified: Secondary | ICD-10-CM | POA: Diagnosis not present

## 2018-12-11 DIAGNOSIS — Z792 Long term (current) use of antibiotics: Secondary | ICD-10-CM | POA: Diagnosis not present

## 2018-12-11 DIAGNOSIS — F1290 Cannabis use, unspecified, uncomplicated: Secondary | ICD-10-CM | POA: Diagnosis not present

## 2018-12-11 DIAGNOSIS — F419 Anxiety disorder, unspecified: Secondary | ICD-10-CM | POA: Diagnosis not present

## 2018-12-11 DIAGNOSIS — M48061 Spinal stenosis, lumbar region without neurogenic claudication: Secondary | ICD-10-CM | POA: Diagnosis not present

## 2018-12-11 DIAGNOSIS — Z9049 Acquired absence of other specified parts of digestive tract: Secondary | ICD-10-CM | POA: Diagnosis not present

## 2018-12-11 DIAGNOSIS — T8149XA Infection following a procedure, other surgical site, initial encounter: Secondary | ICD-10-CM | POA: Diagnosis not present

## 2018-12-13 DIAGNOSIS — F1099 Alcohol use, unspecified with unspecified alcohol-induced disorder: Secondary | ICD-10-CM | POA: Diagnosis not present

## 2018-12-13 DIAGNOSIS — F419 Anxiety disorder, unspecified: Secondary | ICD-10-CM | POA: Diagnosis not present

## 2018-12-13 DIAGNOSIS — F17210 Nicotine dependence, cigarettes, uncomplicated: Secondary | ICD-10-CM | POA: Diagnosis not present

## 2018-12-13 DIAGNOSIS — Z79891 Long term (current) use of opiate analgesic: Secondary | ICD-10-CM | POA: Diagnosis not present

## 2018-12-13 DIAGNOSIS — F329 Major depressive disorder, single episode, unspecified: Secondary | ICD-10-CM | POA: Diagnosis not present

## 2018-12-13 DIAGNOSIS — Z9049 Acquired absence of other specified parts of digestive tract: Secondary | ICD-10-CM | POA: Diagnosis not present

## 2018-12-13 DIAGNOSIS — Z792 Long term (current) use of antibiotics: Secondary | ICD-10-CM | POA: Diagnosis not present

## 2018-12-13 DIAGNOSIS — M1990 Unspecified osteoarthritis, unspecified site: Secondary | ICD-10-CM | POA: Diagnosis not present

## 2018-12-13 DIAGNOSIS — F1290 Cannabis use, unspecified, uncomplicated: Secondary | ICD-10-CM | POA: Diagnosis not present

## 2018-12-13 DIAGNOSIS — E785 Hyperlipidemia, unspecified: Secondary | ICD-10-CM | POA: Diagnosis not present

## 2018-12-13 DIAGNOSIS — Z981 Arthrodesis status: Secondary | ICD-10-CM | POA: Diagnosis not present

## 2018-12-13 DIAGNOSIS — Z1159 Encounter for screening for other viral diseases: Secondary | ICD-10-CM | POA: Diagnosis not present

## 2018-12-13 DIAGNOSIS — M48061 Spinal stenosis, lumbar region without neurogenic claudication: Secondary | ICD-10-CM | POA: Diagnosis not present

## 2018-12-13 DIAGNOSIS — T8149XA Infection following a procedure, other surgical site, initial encounter: Secondary | ICD-10-CM | POA: Diagnosis not present

## 2018-12-16 DIAGNOSIS — Z792 Long term (current) use of antibiotics: Secondary | ICD-10-CM | POA: Diagnosis not present

## 2018-12-16 DIAGNOSIS — M48061 Spinal stenosis, lumbar region without neurogenic claudication: Secondary | ICD-10-CM | POA: Diagnosis not present

## 2018-12-16 DIAGNOSIS — Z1159 Encounter for screening for other viral diseases: Secondary | ICD-10-CM | POA: Diagnosis not present

## 2018-12-16 DIAGNOSIS — Z9049 Acquired absence of other specified parts of digestive tract: Secondary | ICD-10-CM | POA: Diagnosis not present

## 2018-12-16 DIAGNOSIS — F329 Major depressive disorder, single episode, unspecified: Secondary | ICD-10-CM | POA: Diagnosis not present

## 2018-12-16 DIAGNOSIS — Z79891 Long term (current) use of opiate analgesic: Secondary | ICD-10-CM | POA: Diagnosis not present

## 2018-12-16 DIAGNOSIS — T8149XA Infection following a procedure, other surgical site, initial encounter: Secondary | ICD-10-CM | POA: Diagnosis not present

## 2018-12-16 DIAGNOSIS — F17210 Nicotine dependence, cigarettes, uncomplicated: Secondary | ICD-10-CM | POA: Diagnosis not present

## 2018-12-16 DIAGNOSIS — Z981 Arthrodesis status: Secondary | ICD-10-CM | POA: Diagnosis not present

## 2018-12-16 DIAGNOSIS — F1290 Cannabis use, unspecified, uncomplicated: Secondary | ICD-10-CM | POA: Diagnosis not present

## 2018-12-16 DIAGNOSIS — E785 Hyperlipidemia, unspecified: Secondary | ICD-10-CM | POA: Diagnosis not present

## 2018-12-16 DIAGNOSIS — F1099 Alcohol use, unspecified with unspecified alcohol-induced disorder: Secondary | ICD-10-CM | POA: Diagnosis not present

## 2018-12-16 DIAGNOSIS — F419 Anxiety disorder, unspecified: Secondary | ICD-10-CM | POA: Diagnosis not present

## 2018-12-16 DIAGNOSIS — M1990 Unspecified osteoarthritis, unspecified site: Secondary | ICD-10-CM | POA: Diagnosis not present

## 2018-12-17 ENCOUNTER — Encounter: Payer: Self-pay | Admitting: Orthopaedic Surgery

## 2018-12-17 ENCOUNTER — Ambulatory Visit (INDEPENDENT_AMBULATORY_CARE_PROVIDER_SITE_OTHER): Payer: BC Managed Care – PPO | Admitting: Orthopaedic Surgery

## 2018-12-17 VITALS — Ht 67.0 in | Wt 228.0 lb

## 2018-12-17 DIAGNOSIS — T148XXA Other injury of unspecified body region, initial encounter: Secondary | ICD-10-CM

## 2018-12-17 DIAGNOSIS — T8189XA Other complications of procedures, not elsewhere classified, initial encounter: Secondary | ICD-10-CM | POA: Diagnosis not present

## 2018-12-17 DIAGNOSIS — L089 Local infection of the skin and subcutaneous tissue, unspecified: Secondary | ICD-10-CM

## 2018-12-17 MED ORDER — OXYCODONE-ACETAMINOPHEN 5-325 MG PO TABS: 1.0000 | ORAL_TABLET | Freq: Four times a day (QID) | ORAL | 0 refills | Status: DC | PRN

## 2018-12-17 NOTE — Progress Notes (Signed)
Patient in for postop seroma, deep infection with staph lugdunensis on Intra-Op cultures and original culture in the office was negative.  She has had some pain in her left anterior groin.  Pack was changed yesterday.  2.5 cm deep and 1 x 1 cm.  Return 8 days and I will change her back at that time.  She continues antibiotics Percocet renewed.

## 2018-12-18 DIAGNOSIS — Z1159 Encounter for screening for other viral diseases: Secondary | ICD-10-CM | POA: Diagnosis not present

## 2018-12-18 DIAGNOSIS — F329 Major depressive disorder, single episode, unspecified: Secondary | ICD-10-CM | POA: Diagnosis not present

## 2018-12-18 DIAGNOSIS — M48061 Spinal stenosis, lumbar region without neurogenic claudication: Secondary | ICD-10-CM | POA: Diagnosis not present

## 2018-12-18 DIAGNOSIS — T8149XA Infection following a procedure, other surgical site, initial encounter: Secondary | ICD-10-CM | POA: Diagnosis not present

## 2018-12-18 DIAGNOSIS — Z9049 Acquired absence of other specified parts of digestive tract: Secondary | ICD-10-CM | POA: Diagnosis not present

## 2018-12-18 DIAGNOSIS — F419 Anxiety disorder, unspecified: Secondary | ICD-10-CM | POA: Diagnosis not present

## 2018-12-18 DIAGNOSIS — T8189XA Other complications of procedures, not elsewhere classified, initial encounter: Secondary | ICD-10-CM | POA: Diagnosis not present

## 2018-12-18 DIAGNOSIS — Z792 Long term (current) use of antibiotics: Secondary | ICD-10-CM | POA: Diagnosis not present

## 2018-12-18 DIAGNOSIS — F17210 Nicotine dependence, cigarettes, uncomplicated: Secondary | ICD-10-CM | POA: Diagnosis not present

## 2018-12-18 DIAGNOSIS — Z79891 Long term (current) use of opiate analgesic: Secondary | ICD-10-CM | POA: Diagnosis not present

## 2018-12-18 DIAGNOSIS — Z981 Arthrodesis status: Secondary | ICD-10-CM | POA: Diagnosis not present

## 2018-12-18 DIAGNOSIS — F1099 Alcohol use, unspecified with unspecified alcohol-induced disorder: Secondary | ICD-10-CM | POA: Diagnosis not present

## 2018-12-18 DIAGNOSIS — F1290 Cannabis use, unspecified, uncomplicated: Secondary | ICD-10-CM | POA: Diagnosis not present

## 2018-12-18 DIAGNOSIS — M1990 Unspecified osteoarthritis, unspecified site: Secondary | ICD-10-CM | POA: Diagnosis not present

## 2018-12-18 DIAGNOSIS — E785 Hyperlipidemia, unspecified: Secondary | ICD-10-CM | POA: Diagnosis not present

## 2018-12-19 DIAGNOSIS — T8189XA Other complications of procedures, not elsewhere classified, initial encounter: Secondary | ICD-10-CM | POA: Diagnosis not present

## 2018-12-20 DIAGNOSIS — F1099 Alcohol use, unspecified with unspecified alcohol-induced disorder: Secondary | ICD-10-CM | POA: Diagnosis not present

## 2018-12-20 DIAGNOSIS — Z1159 Encounter for screening for other viral diseases: Secondary | ICD-10-CM | POA: Diagnosis not present

## 2018-12-20 DIAGNOSIS — Z981 Arthrodesis status: Secondary | ICD-10-CM | POA: Diagnosis not present

## 2018-12-20 DIAGNOSIS — T8149XA Infection following a procedure, other surgical site, initial encounter: Secondary | ICD-10-CM | POA: Diagnosis not present

## 2018-12-20 DIAGNOSIS — F17210 Nicotine dependence, cigarettes, uncomplicated: Secondary | ICD-10-CM | POA: Diagnosis not present

## 2018-12-20 DIAGNOSIS — E785 Hyperlipidemia, unspecified: Secondary | ICD-10-CM | POA: Diagnosis not present

## 2018-12-20 DIAGNOSIS — Z9049 Acquired absence of other specified parts of digestive tract: Secondary | ICD-10-CM | POA: Diagnosis not present

## 2018-12-20 DIAGNOSIS — F329 Major depressive disorder, single episode, unspecified: Secondary | ICD-10-CM | POA: Diagnosis not present

## 2018-12-20 DIAGNOSIS — Z792 Long term (current) use of antibiotics: Secondary | ICD-10-CM | POA: Diagnosis not present

## 2018-12-20 DIAGNOSIS — Z79891 Long term (current) use of opiate analgesic: Secondary | ICD-10-CM | POA: Diagnosis not present

## 2018-12-20 DIAGNOSIS — F1290 Cannabis use, unspecified, uncomplicated: Secondary | ICD-10-CM | POA: Diagnosis not present

## 2018-12-20 DIAGNOSIS — M1990 Unspecified osteoarthritis, unspecified site: Secondary | ICD-10-CM | POA: Diagnosis not present

## 2018-12-20 DIAGNOSIS — M48061 Spinal stenosis, lumbar region without neurogenic claudication: Secondary | ICD-10-CM | POA: Diagnosis not present

## 2018-12-20 DIAGNOSIS — T8189XA Other complications of procedures, not elsewhere classified, initial encounter: Secondary | ICD-10-CM | POA: Diagnosis not present

## 2018-12-20 DIAGNOSIS — F419 Anxiety disorder, unspecified: Secondary | ICD-10-CM | POA: Diagnosis not present

## 2018-12-21 DIAGNOSIS — T8189XA Other complications of procedures, not elsewhere classified, initial encounter: Secondary | ICD-10-CM | POA: Diagnosis not present

## 2018-12-22 DIAGNOSIS — T8189XA Other complications of procedures, not elsewhere classified, initial encounter: Secondary | ICD-10-CM | POA: Diagnosis not present

## 2018-12-23 DIAGNOSIS — Z981 Arthrodesis status: Secondary | ICD-10-CM | POA: Diagnosis not present

## 2018-12-23 DIAGNOSIS — F1290 Cannabis use, unspecified, uncomplicated: Secondary | ICD-10-CM | POA: Diagnosis not present

## 2018-12-23 DIAGNOSIS — Z1159 Encounter for screening for other viral diseases: Secondary | ICD-10-CM | POA: Diagnosis not present

## 2018-12-23 DIAGNOSIS — E785 Hyperlipidemia, unspecified: Secondary | ICD-10-CM | POA: Diagnosis not present

## 2018-12-23 DIAGNOSIS — F1099 Alcohol use, unspecified with unspecified alcohol-induced disorder: Secondary | ICD-10-CM | POA: Diagnosis not present

## 2018-12-23 DIAGNOSIS — T8189XA Other complications of procedures, not elsewhere classified, initial encounter: Secondary | ICD-10-CM | POA: Diagnosis not present

## 2018-12-23 DIAGNOSIS — F17210 Nicotine dependence, cigarettes, uncomplicated: Secondary | ICD-10-CM | POA: Diagnosis not present

## 2018-12-23 DIAGNOSIS — Z79891 Long term (current) use of opiate analgesic: Secondary | ICD-10-CM | POA: Diagnosis not present

## 2018-12-23 DIAGNOSIS — Z9049 Acquired absence of other specified parts of digestive tract: Secondary | ICD-10-CM | POA: Diagnosis not present

## 2018-12-23 DIAGNOSIS — T8149XA Infection following a procedure, other surgical site, initial encounter: Secondary | ICD-10-CM | POA: Diagnosis not present

## 2018-12-23 DIAGNOSIS — Z792 Long term (current) use of antibiotics: Secondary | ICD-10-CM | POA: Diagnosis not present

## 2018-12-23 DIAGNOSIS — M1990 Unspecified osteoarthritis, unspecified site: Secondary | ICD-10-CM | POA: Diagnosis not present

## 2018-12-23 DIAGNOSIS — F419 Anxiety disorder, unspecified: Secondary | ICD-10-CM | POA: Diagnosis not present

## 2018-12-23 DIAGNOSIS — M48061 Spinal stenosis, lumbar region without neurogenic claudication: Secondary | ICD-10-CM | POA: Diagnosis not present

## 2018-12-23 DIAGNOSIS — F329 Major depressive disorder, single episode, unspecified: Secondary | ICD-10-CM | POA: Diagnosis not present

## 2018-12-24 DIAGNOSIS — T8189XA Other complications of procedures, not elsewhere classified, initial encounter: Secondary | ICD-10-CM | POA: Diagnosis not present

## 2018-12-25 ENCOUNTER — Other Ambulatory Visit: Payer: Self-pay

## 2018-12-25 ENCOUNTER — Encounter: Payer: Self-pay | Admitting: Orthopaedic Surgery

## 2018-12-25 ENCOUNTER — Ambulatory Visit (INDEPENDENT_AMBULATORY_CARE_PROVIDER_SITE_OTHER): Payer: BC Managed Care – PPO | Admitting: Orthopaedic Surgery

## 2018-12-25 ENCOUNTER — Other Ambulatory Visit: Payer: Self-pay | Admitting: *Deleted

## 2018-12-25 VITALS — BP 138/97 | HR 87 | Ht 67.0 in | Wt 228.0 lb

## 2018-12-25 DIAGNOSIS — L089 Local infection of the skin and subcutaneous tissue, unspecified: Secondary | ICD-10-CM

## 2018-12-25 DIAGNOSIS — T148XXA Other injury of unspecified body region, initial encounter: Secondary | ICD-10-CM

## 2018-12-25 DIAGNOSIS — T8189XA Other complications of procedures, not elsewhere classified, initial encounter: Secondary | ICD-10-CM | POA: Diagnosis not present

## 2018-12-25 MED ORDER — DOXYCYCLINE HYCLATE 50 MG PO CAPS: 100.0000 mg | ORAL_CAPSULE | Freq: Two times a day (BID) | ORAL | 0 refills | Status: DC

## 2018-12-25 NOTE — Patient Outreach (Signed)
[facility]) Care Management  12/25/2018  Emily Bender 01/06/1966 [id number]   Telephone assessment  Transition of care by PCP office, embedded [facility] care management office.   Referral received : 8/10 Referral source; [facility] [place] Referral reason : post hospital follow up    Patient is 53 year old female with recent hospital admission at [facility]  8/3-8/7 for evacuation of lumbar seroma/hematoma and VAC placement.   PMHX : includes s/p left L3 &L4 hemilaminectomy removal of herniated nucleus pulposus on 10/30/18. Chronic condition of depression, anxiety.    Subjective  Patient reports that she is doing well , home heath RN visited on Monday and VAC was removed. She discussed visit with Dr.Yates and wound making progress.  Patient to remain off wound VAC and have surgeon visit again in 2 weeks. She discussed continuing to be on antibiotics and will do daily dressing changes patient discussed having support from her mom.   Patient reports being eager to get back to work, [employer] in the next 2 weeks.  She reports improvement in pain control .   Discussed with patient follow up with PCP and Flu shot for prevention , she states she has never and never will take flu shot she states that and smoking she would not discuss.   Patient care goal reviewed,current goal met. Patient denies further care needs at this time or telephone follow up .Reinforced continued follow up with surgeon, scheduling PCP visit.  Plan Will close case as goals have been met  Will send PCP case closure letter.    Joylene Draft, RN, [place] Management Coordinator  [phone]- Mobile [phone]- Toll Free Main Office

## 2018-12-25 NOTE — Progress Notes (Signed)
Postop visit follow-up for postop seroma hematoma with positive culture obtained interop been negative cultures obtained from the draining wound preop.  She just ran out of her doxycycline and needs a refill.  Back in her stopped using the VAC and wound is being packed.  Patient's culture was growing Staphylococcus lugdunensis.  I plan to recheck her in 2 weeks.  She is weaning her pain medication.  She is continue daily wet-to-dry packing.  Recheck 2 weeks.  Work slip given no work x2 weeks.

## 2018-12-26 DIAGNOSIS — F1099 Alcohol use, unspecified with unspecified alcohol-induced disorder: Secondary | ICD-10-CM | POA: Diagnosis not present

## 2018-12-26 DIAGNOSIS — F17210 Nicotine dependence, cigarettes, uncomplicated: Secondary | ICD-10-CM | POA: Diagnosis not present

## 2018-12-26 DIAGNOSIS — F329 Major depressive disorder, single episode, unspecified: Secondary | ICD-10-CM | POA: Diagnosis not present

## 2018-12-26 DIAGNOSIS — T8149XA Infection following a procedure, other surgical site, initial encounter: Secondary | ICD-10-CM | POA: Diagnosis not present

## 2018-12-26 DIAGNOSIS — Z792 Long term (current) use of antibiotics: Secondary | ICD-10-CM | POA: Diagnosis not present

## 2018-12-26 DIAGNOSIS — Z1159 Encounter for screening for other viral diseases: Secondary | ICD-10-CM | POA: Diagnosis not present

## 2018-12-26 DIAGNOSIS — M1990 Unspecified osteoarthritis, unspecified site: Secondary | ICD-10-CM | POA: Diagnosis not present

## 2018-12-26 DIAGNOSIS — F419 Anxiety disorder, unspecified: Secondary | ICD-10-CM | POA: Diagnosis not present

## 2018-12-26 DIAGNOSIS — E785 Hyperlipidemia, unspecified: Secondary | ICD-10-CM | POA: Diagnosis not present

## 2018-12-26 DIAGNOSIS — F1290 Cannabis use, unspecified, uncomplicated: Secondary | ICD-10-CM | POA: Diagnosis not present

## 2018-12-26 DIAGNOSIS — Z981 Arthrodesis status: Secondary | ICD-10-CM | POA: Diagnosis not present

## 2018-12-26 DIAGNOSIS — T8189XA Other complications of procedures, not elsewhere classified, initial encounter: Secondary | ICD-10-CM | POA: Diagnosis not present

## 2018-12-26 DIAGNOSIS — Z79891 Long term (current) use of opiate analgesic: Secondary | ICD-10-CM | POA: Diagnosis not present

## 2018-12-26 DIAGNOSIS — Z9049 Acquired absence of other specified parts of digestive tract: Secondary | ICD-10-CM | POA: Diagnosis not present

## 2018-12-26 DIAGNOSIS — M48061 Spinal stenosis, lumbar region without neurogenic claudication: Secondary | ICD-10-CM | POA: Diagnosis not present

## 2018-12-27 ENCOUNTER — Telehealth: Payer: Self-pay

## 2018-12-27 DIAGNOSIS — T8189XA Other complications of procedures, not elsewhere classified, initial encounter: Secondary | ICD-10-CM | POA: Diagnosis not present

## 2018-12-27 NOTE — Telephone Encounter (Signed)
Lori with [facility] called to verify wet to dry dressing daily for patient's wound.  Advised her of Dr. Narda Amber office note from visit on Wednesday, 12/25/2018.  Voiced that she understands.

## 2018-12-28 DIAGNOSIS — T8189XA Other complications of procedures, not elsewhere classified, initial encounter: Secondary | ICD-10-CM | POA: Diagnosis not present

## 2018-12-29 DIAGNOSIS — T8189XA Other complications of procedures, not elsewhere classified, initial encounter: Secondary | ICD-10-CM | POA: Diagnosis not present

## 2018-12-30 DIAGNOSIS — T8189XA Other complications of procedures, not elsewhere classified, initial encounter: Secondary | ICD-10-CM | POA: Diagnosis not present

## 2018-12-31 ENCOUNTER — Telehealth: Payer: Self-pay | Admitting: Orthopaedic Surgery

## 2018-12-31 DIAGNOSIS — F17210 Nicotine dependence, cigarettes, uncomplicated: Secondary | ICD-10-CM | POA: Diagnosis not present

## 2018-12-31 DIAGNOSIS — E785 Hyperlipidemia, unspecified: Secondary | ICD-10-CM | POA: Diagnosis not present

## 2018-12-31 DIAGNOSIS — F1099 Alcohol use, unspecified with unspecified alcohol-induced disorder: Secondary | ICD-10-CM | POA: Diagnosis not present

## 2018-12-31 DIAGNOSIS — M48061 Spinal stenosis, lumbar region without neurogenic claudication: Secondary | ICD-10-CM | POA: Diagnosis not present

## 2018-12-31 DIAGNOSIS — F1290 Cannabis use, unspecified, uncomplicated: Secondary | ICD-10-CM | POA: Diagnosis not present

## 2018-12-31 DIAGNOSIS — Z792 Long term (current) use of antibiotics: Secondary | ICD-10-CM | POA: Diagnosis not present

## 2018-12-31 DIAGNOSIS — Z981 Arthrodesis status: Secondary | ICD-10-CM | POA: Diagnosis not present

## 2018-12-31 DIAGNOSIS — F329 Major depressive disorder, single episode, unspecified: Secondary | ICD-10-CM | POA: Diagnosis not present

## 2018-12-31 DIAGNOSIS — Z9049 Acquired absence of other specified parts of digestive tract: Secondary | ICD-10-CM | POA: Diagnosis not present

## 2018-12-31 DIAGNOSIS — M1990 Unspecified osteoarthritis, unspecified site: Secondary | ICD-10-CM | POA: Diagnosis not present

## 2018-12-31 DIAGNOSIS — F419 Anxiety disorder, unspecified: Secondary | ICD-10-CM | POA: Diagnosis not present

## 2018-12-31 DIAGNOSIS — Z79891 Long term (current) use of opiate analgesic: Secondary | ICD-10-CM | POA: Diagnosis not present

## 2018-12-31 DIAGNOSIS — T8189XA Other complications of procedures, not elsewhere classified, initial encounter: Secondary | ICD-10-CM | POA: Diagnosis not present

## 2018-12-31 DIAGNOSIS — Z1159 Encounter for screening for other viral diseases: Secondary | ICD-10-CM | POA: Diagnosis not present

## 2018-12-31 DIAGNOSIS — T8149XA Infection following a procedure, other surgical site, initial encounter: Secondary | ICD-10-CM | POA: Diagnosis not present

## 2018-12-31 NOTE — Telephone Encounter (Signed)
I called discussed. Have her come in and see Jeneen Rinks and get standing AP of knees and lateral of painful knee plus sunrise patella.  Jeneen Rinks can decide on Tx. She wants TkA if that is what she needs in Nov or Dec. .   If not bone on bone he can consider MRI of knee thanks . If bone on bone , NSAIDS for 6 wks and TKA posted for Emily Bender and me do. thanks

## 2018-12-31 NOTE — Telephone Encounter (Signed)
Patient called advised she need an appointment with another provider since Dr. Lorin Mercy is going to be out. Patient said she can not come in at 10:00am tomorrow with Dr Lorin Mercy. Patient said she is having a lot of pain in her knee and it's  bone on bone. The number to contact patient is [phone]

## 2018-12-31 NOTE — Telephone Encounter (Signed)
Please advise. Is there someone here that you would like for her to see for her knee?

## 2019-01-01 DIAGNOSIS — T8189XA Other complications of procedures, not elsewhere classified, initial encounter: Secondary | ICD-10-CM | POA: Diagnosis not present

## 2019-01-01 NOTE — Telephone Encounter (Signed)
Could you please call patient and make an appointment with Jeneen Rinks for knee pain? It can be next week or so if he has anything available. Thanks.

## 2019-01-02 DIAGNOSIS — Z79891 Long term (current) use of opiate analgesic: Secondary | ICD-10-CM | POA: Diagnosis not present

## 2019-01-02 DIAGNOSIS — Z792 Long term (current) use of antibiotics: Secondary | ICD-10-CM | POA: Diagnosis not present

## 2019-01-02 DIAGNOSIS — Z9049 Acquired absence of other specified parts of digestive tract: Secondary | ICD-10-CM | POA: Diagnosis not present

## 2019-01-02 DIAGNOSIS — F17210 Nicotine dependence, cigarettes, uncomplicated: Secondary | ICD-10-CM | POA: Diagnosis not present

## 2019-01-02 DIAGNOSIS — F419 Anxiety disorder, unspecified: Secondary | ICD-10-CM | POA: Diagnosis not present

## 2019-01-02 DIAGNOSIS — T8189XA Other complications of procedures, not elsewhere classified, initial encounter: Secondary | ICD-10-CM | POA: Diagnosis not present

## 2019-01-02 DIAGNOSIS — F329 Major depressive disorder, single episode, unspecified: Secondary | ICD-10-CM | POA: Diagnosis not present

## 2019-01-02 DIAGNOSIS — Z1159 Encounter for screening for other viral diseases: Secondary | ICD-10-CM | POA: Diagnosis not present

## 2019-01-02 DIAGNOSIS — T8149XA Infection following a procedure, other surgical site, initial encounter: Secondary | ICD-10-CM | POA: Diagnosis not present

## 2019-01-02 DIAGNOSIS — E785 Hyperlipidemia, unspecified: Secondary | ICD-10-CM | POA: Diagnosis not present

## 2019-01-02 DIAGNOSIS — F1290 Cannabis use, unspecified, uncomplicated: Secondary | ICD-10-CM | POA: Diagnosis not present

## 2019-01-02 DIAGNOSIS — M1990 Unspecified osteoarthritis, unspecified site: Secondary | ICD-10-CM | POA: Diagnosis not present

## 2019-01-02 DIAGNOSIS — M48061 Spinal stenosis, lumbar region without neurogenic claudication: Secondary | ICD-10-CM | POA: Diagnosis not present

## 2019-01-02 DIAGNOSIS — F1099 Alcohol use, unspecified with unspecified alcohol-induced disorder: Secondary | ICD-10-CM | POA: Diagnosis not present

## 2019-01-02 DIAGNOSIS — Z981 Arthrodesis status: Secondary | ICD-10-CM | POA: Diagnosis not present

## 2019-01-03 DIAGNOSIS — T8189XA Other complications of procedures, not elsewhere classified, initial encounter: Secondary | ICD-10-CM | POA: Diagnosis not present

## 2019-01-07 DIAGNOSIS — E785 Hyperlipidemia, unspecified: Secondary | ICD-10-CM | POA: Diagnosis not present

## 2019-01-07 DIAGNOSIS — F419 Anxiety disorder, unspecified: Secondary | ICD-10-CM | POA: Diagnosis not present

## 2019-01-07 DIAGNOSIS — M1990 Unspecified osteoarthritis, unspecified site: Secondary | ICD-10-CM | POA: Diagnosis not present

## 2019-01-07 DIAGNOSIS — Z1159 Encounter for screening for other viral diseases: Secondary | ICD-10-CM | POA: Diagnosis not present

## 2019-01-07 DIAGNOSIS — F1099 Alcohol use, unspecified with unspecified alcohol-induced disorder: Secondary | ICD-10-CM | POA: Diagnosis not present

## 2019-01-07 DIAGNOSIS — Z981 Arthrodesis status: Secondary | ICD-10-CM | POA: Diagnosis not present

## 2019-01-07 DIAGNOSIS — Z792 Long term (current) use of antibiotics: Secondary | ICD-10-CM | POA: Diagnosis not present

## 2019-01-07 DIAGNOSIS — Z9049 Acquired absence of other specified parts of digestive tract: Secondary | ICD-10-CM | POA: Diagnosis not present

## 2019-01-07 DIAGNOSIS — F329 Major depressive disorder, single episode, unspecified: Secondary | ICD-10-CM | POA: Diagnosis not present

## 2019-01-07 DIAGNOSIS — F17210 Nicotine dependence, cigarettes, uncomplicated: Secondary | ICD-10-CM | POA: Diagnosis not present

## 2019-01-07 DIAGNOSIS — F1290 Cannabis use, unspecified, uncomplicated: Secondary | ICD-10-CM | POA: Diagnosis not present

## 2019-01-07 DIAGNOSIS — M48061 Spinal stenosis, lumbar region without neurogenic claudication: Secondary | ICD-10-CM | POA: Diagnosis not present

## 2019-01-07 DIAGNOSIS — T8149XA Infection following a procedure, other surgical site, initial encounter: Secondary | ICD-10-CM | POA: Diagnosis not present

## 2019-01-07 DIAGNOSIS — Z79891 Long term (current) use of opiate analgesic: Secondary | ICD-10-CM | POA: Diagnosis not present

## 2019-01-08 ENCOUNTER — Ambulatory Visit (INDEPENDENT_AMBULATORY_CARE_PROVIDER_SITE_OTHER): Payer: BC Managed Care – PPO

## 2019-01-08 ENCOUNTER — Encounter: Payer: Self-pay | Admitting: Orthopaedic Surgery

## 2019-01-08 ENCOUNTER — Ambulatory Visit (INDEPENDENT_AMBULATORY_CARE_PROVIDER_SITE_OTHER): Payer: BC Managed Care – PPO | Admitting: Orthopaedic Surgery

## 2019-01-08 ENCOUNTER — Other Ambulatory Visit: Payer: Self-pay | Admitting: Family Medicine

## 2019-01-08 VITALS — BP 147/95 | HR 83 | Ht 67.0 in | Wt 228.0 lb

## 2019-01-08 DIAGNOSIS — M25562 Pain in left knee: Secondary | ICD-10-CM | POA: Diagnosis not present

## 2019-01-08 DIAGNOSIS — G8929 Other chronic pain: Secondary | ICD-10-CM

## 2019-01-08 DIAGNOSIS — F329 Major depressive disorder, single episode, unspecified: Secondary | ICD-10-CM

## 2019-01-08 DIAGNOSIS — M1712 Unilateral primary osteoarthritis, left knee: Secondary | ICD-10-CM

## 2019-01-08 DIAGNOSIS — F32A Depression, unspecified: Secondary | ICD-10-CM

## 2019-01-08 NOTE — Progress Notes (Signed)
Patient: Emily Bender, Female    DOB: 09/08/1965, 53 y.o.   MRN: [id number] Visit Date: 01/09/2019  Today's Provider: Vernie Murders, PA   Chief Complaint  Patient presents with  . Annual Exam   Subjective:  Emily Bender is a 53 y.o. female who presents today for health maintenance and complete physical. She feels poorly. She reports no regular exercising. She reports she is sleeping poorly.   Review of Systems  Constitutional: Negative.   HENT: Negative.   Eyes: Negative.   Respiratory: Negative.   Cardiovascular: Negative.   Gastrointestinal: Negative.   Endocrine: Negative.   Genitourinary: Negative.   Musculoskeletal: Negative.   Skin: Negative.   Allergic/Immunologic: Negative.   Neurological: Negative.   Hematological: Negative.   Psychiatric/Behavioral: Negative.    Social History   Socioeconomic History  . Marital status: Single    Spouse name: Not on file  . Number of children: Not on file  . Years of education: Not on file  . Highest education level: Not on file  Occupational History  . Not on file  Social Needs  . Financial resource strain: Not hard at all  . Food insecurity    Worry: Never true    Inability: Never true  . Transportation needs    Medical: No    Non-medical: No  Tobacco Use  . Smoking status: Current Every Day Smoker    Packs/day: 0.25    Years: 20.00    Pack years: 5.00    Types: Cigarettes  . Smokeless tobacco: Never Used  Substance and Sexual Activity  . Alcohol use: Yes    Alcohol/week: 6.0 standard drinks    Types: 6 Cans of beer per week  . Drug use: Yes    Frequency: 7.0 times per week    Types: Marijuana    Comment: last use 10/22/2018  . Sexual activity: Not on file  Lifestyle  . Physical activity    Days per week: Not on file    Minutes per session: Not on file  . Stress: Not on file  Relationships  . Social Herbalist on phone: Not on file    Gets together: Not on file    Attends religious service:  Not on file    Active member of club or organization: Not on file    Attends meetings of clubs or organizations: Not on file    Relationship status: Not on file  . Intimate partner violence    Fear of current or ex partner: Not on file    Emotionally abused: Not on file    Physically abused: Not on file    Forced sexual activity: Not on file  Other Topics Concern  . Not on file  Social History Narrative  . Not on file   Patient Active Problem List   Diagnosis Date Noted  . Unilateral primary osteoarthritis, left knee 01/08/2019  . Wound infection 11/18/2018  . Lumbar stenosis 10/30/2018  . Herniated nucleus pulposus, lumbar 10/30/2018  . Spinal stenosis of lumbar region 10/16/2018  . Lumbar herniated disc 10/14/2018  . Alcohol drinker 05/20/2015  . Anxiety and depression 05/20/2015  . History of artificial joint 05/20/2015  . HLD (hyperlipidemia) 05/20/2015  . Avitaminosis D 05/20/2015  . Chronic cough 01/20/2014  . Adiposity 01/18/2012  . Irregular bleeding 11/27/2006  . Tobacco use 06/23/2005   Past Surgical History:  Procedure Laterality Date  . ANKLE SURGERY  01/17/2012   x3  . APPLICATION OF WOUND  VAC N/A 11/18/2018   Procedure: Application Of Wound Vac;  Surgeon: Marybelle Killings, MD;  Location: [place];  Service: Orthopedics;  Laterality: N/A;  . BACK SURGERY    . BREAST BIOPSY Left 1990's  . CHOLECYSTECTOMY  2000  . I&D EXTREMITY N/A 11/18/2018   Procedure: EVACUATION OF LUMBAR SEROMA/HEMATOMA;  Surgeon: Marybelle Killings, MD;  Location: [place];  Service: Orthopedics;  Laterality: N/A;  . LAMINECTOMY  1996  . LUMBAR LAMINECTOMY N/A 10/30/2018   Procedure: left L3 & L4 hemilaminectomy, removal of herniated nucleus pulposus;  Surgeon: Marybelle Killings, MD;  Location: [place];  Service: Orthopedics;  Laterality: N/A;  . WRIST SURGERY Right    Her family history includes Aneurysm in her maternal grandmother; Coronary artery disease in her paternal grandmother; Diabetes in her mother;  Healthy in her brother; Hypertension in her mother; Lung cancer in her father and maternal grandfather.     Outpatient Encounter Medications as of 01/09/2019  Medication Sig  . diazepam (VALIUM) 5 MG tablet TAKE ONE TABLET (5 MG) BY MOUTH TWICE DAILY AS NEEDED FOR ANXIETY OR AGITATION  . doxycycline (VIBRAMYCIN) 50 MG capsule Take 2 capsules (100 mg total) by mouth 2 (two) times daily.  Marland Kitchen gabapentin (NEURONTIN) 300 MG capsule TAKE 1 CAPSULE BY MOUTH 3 TIMES DAILY  . meloxicam (MOBIC) 7.5 MG tablet Take 1 tablet (7.5 mg total) by mouth 2 (two) times daily. (Patient taking differently: Take 15 mg by mouth daily. )  . sertraline (ZOLOFT) 100 MG tablet TAKE ONE TABLET EVERY DAY AT BEDTIME  . oxyCODONE-acetaminophen (PERCOCET) 5-325 MG tablet Take 1-2 tablets by mouth every 6 (six) hours as needed for severe pain. Post op lumbar pain 11/18/18 surgery (Patient not taking: Reported on 01/09/2019)   No facility-administered encounter medications on file as of 01/09/2019.    Allergies  Allergen Reactions  . Sulfa Antibiotics Hives   Patient Care Team: Chrismon, Vickki Muff, PA as PCP - General (Physician Assistant)     Objective:   Vitals:  Vitals:   01/09/19 1326  BP: 126/80  Pulse: 89  Resp: 16  Temp: (!) 96.9 F (36.1 C)  TempSrc: Temporal  SpO2: 96%  Weight: 233 lb (105.7 kg)   Wt Readings from Last 3 Encounters:  01/09/19 233 lb (105.7 kg)  01/08/19 228 lb (103.4 kg)  12/25/18 228 lb (103.4 kg)   Physical Exam Constitutional:      Appearance: She is well-developed.  HENT:     Head: Normocephalic and atraumatic.     Right Ear: External ear normal.     Left Ear: External ear normal.     Nose: Nose normal.  Eyes:     General:        Right eye: No discharge.     Conjunctiva/sclera: Conjunctivae normal.     Pupils: Pupils are equal, round, and reactive to light.  Neck:     Musculoskeletal: Normal range of motion and neck supple.     Thyroid: No thyromegaly.     Trachea: No  tracheal deviation.  Cardiovascular:     Rate and Rhythm: Normal rate and regular rhythm.     Pulses: Normal pulses.     Heart sounds: Normal heart sounds. No murmur.  Pulmonary:     Effort: Pulmonary effort is normal. No respiratory distress.     Breath sounds: Normal breath sounds. No wheezing or rales.  Chest:     Chest wall: No tenderness.  Abdominal:  General: There is no distension.     Palpations: Abdomen is soft. There is no mass.     Tenderness: There is no abdominal tenderness. There is no guarding or rebound.  Genitourinary:    General: Normal vulva.  Musculoskeletal:        General: No tenderness.     Comments: Healing wound lower lumbar region from L3-4 hemilaminectomy in July 2020 with postop infection and hematoma. Crepitus and stiffness in the left knee - history of documented osteoarthritis and planning TKA.  Lymphadenopathy:     Cervical: No cervical adenopathy.  Skin:    General: Skin is warm and dry.     Findings: No erythema or rash.  Neurological:     Mental Status: She is alert and oriented to person, place, and time.     Cranial Nerves: No cranial nerve deficit.     Motor: No abnormal muscle tone.     Coordination: Coordination normal.     Deep Tendon Reflexes: Reflexes are normal and symmetric. Reflexes normal.  Psychiatric:        Behavior: Behavior normal.        Thought Content: Thought content normal.        Judgment: Judgment normal.      Depression Screen PHQ 2/9 Scores 01/09/2019 11/26/2018 01/31/2018 02/28/2016  PHQ - 2 Score 0 0 0 1  PHQ- 9 Score 0 - - 4   Assessment & Plan:     Routine Health Maintenance and Physical Exam  Exercise Activities and Dietary recommendations Goals   Activity level limited due to back surgeries and severe osteoarthritis in the left knee.     Immunization History  Administered Date(s) Administered  . Tdap 11/22/2010    Health Maintenance  Topic Date Due  . HIV Screening  09/28/1981  . MAMMOGRAM   09/28/2016  . COLONOSCOPY  09/28/2016  . PAP SMEAR-Modifier  02/28/2019  . INFLUENZA VACCINE  02/01/2024 (Originally 11/16/2018)  . TETANUS/TDAP  11/21/2020    Discussed health benefits of physical activity, and encouraged her to engage in regular exercise appropriate for her age and condition.   1. Annual physical exam Fair general health with snoring a lot, poor sleep pattern, anxiety and chronic back pain with osteoarthritis in the left knee. Due for screening colonoscopy and mammograms. Get routine labs and PAP. Refuses flu shot today. Urinalysis today showed sp.gr. 1.015 with pH 6.0.  - CBC with Differential/Platelet - Comprehensive metabolic panel - Lipid panel - TSH - Ambulatory referral to Gastroenterology  2. Herniated nucleus pulposus, lumbar Had left L3-4 hemilaminectomy on 10-30-18 and postop seroma hematoma with staphylococcus lugdenensis on 11-18-18 by Dr. Lorin Mercy (orthopedist). Scars well healed except a small 1 cm wound without signs of infection at the present. Weaned off the Percocet and still taking the Gabapentin 300 mg TID for pain. Still on Doxycycline 100 mg BID. Continue follow up with orthopedist as planned.  3. Unilateral primary osteoarthritis, left knee Ambulation difficulties due to pain in the left knee. Schedule for left total knee arthroplasty 02-26-19 by Dr. Lorin Mercy (orthopedist).  4. Anxiety and depression Still taking Sertraline 100 mg hs with Diazepam 5 mg BID for anxiety, agitation and mood swings. No suicidal ideation. Recommend she restrict alcohol intake to no more than 1-2 beers a day. Unable to exercise with chronic back pain and left knee pain from osteoarthritis. Check routine follow up labs. - CBC with Differential/Platelet - Comprehensive metabolic panel - TSH - diazepam (VALIUM) 5 MG tablet; TAKE ONE  TABLET (5 MG) BY MOUTH TWICE DAILY AS NEEDED FOR ANXIETY OR AGITATION  Dispense: 30 tablet; Refill: 2  5. Mixed hyperlipidemia History of total  cholesterol 206, triglycerides 191 , HDL 58 and LDL 110 on 03-13-16. Recommend low fat diet, weight loss and recheck follow up labs. - Comprehensive metabolic panel - Lipid panel - TSH  6. Colon cancer screening No significant blood in stools or BM changes. Due for screening colonoscopy. - CBC with Differential/Platelet - Ambulatory referral to Gastroenterology  7. Breast cancer screening by mammogram Normal breast exam and recommend scheduling mammograms.  8. Needs flu shot Refuses vaccination today. Presently on antibiotics for back wound.  9. Loud snoring Poor sleep pattern and snoring very loud. Asked her to take an Epworth Sleepiness Questionnaire home to fill out with house mates. Describes sleep apnea characteristics of falling asleep easily after meals, riding in a car with others driving or trying to watch TV.  10. Pap smear for cervical cancer screening - Cytology - PAP

## 2019-01-08 NOTE — Progress Notes (Signed)
Office Visit Note   Patient: Emily Bender           Date of Birth: 07-05-1965           MRN: [id number] Visit Date: 01/08/2019              Requested by: Margo Common, [employer] [place],  [place] [id number] PCP: Margo Common, [place]   Assessment & Plan: Visit Diagnoses:  1. Chronic pain of left knee   2. Unilateral primary osteoarthritis, left knee     Plan: Patient is happy with results of microdiscectomy.  She states she is ready to proceed with total knee arthroplasty of her left knee to the progressive varus deformity with shifting pain swelling and failure to respond to conservative treatment including exercises anti-inflammatories for greater than 3 months.  She like to set this up before the end of the year in either November or December.  We discussed anesthesia, CPM use, outpatient therapy after home therapy.  Exercise program discussed questions were elicited and answered.  Follow-Up Instructions: No follow-ups on file.   Orders:  Orders Placed This Encounter  Procedures  . XR KNEE 3 VIEW LEFT   No orders of the defined types were placed in this encounter.     Procedures: No procedures performed   Clinical Data: No additional findings.   Subjective: Chief Complaint  Patient presents with  . Left Knee - Pain  . Lower Back - Wound Check    HPI 53 year old female returns post microdiscectomy for migrated fragment with postop seroma with positive cultures.  She had a VAC for several weeks now she just has a dime size area that on some days has trace serous drainage others not.  No fever or chills.  Left knee is been progressively painful with progressive varus deformity and she is been having difficulty ambulating.  She is been on long-term meloxicam without relief she has been through therapy exercises.  She is taken hydrocodone for pain she noticed progressive varus deformity of her left knee only with swelling at the end of the day difficulty  with sitting to standing.  She has been on prednisone in the past.  Review of Systems problems positive for microdiscectomy postop wound infection resolved with washout procedure VAC and antibiotics.  Positive anxiety depression hyperlipidemia.  Overweight with BMI 35.  Previous right ankle arthroplasty done at [facility].  Previous L5-S1 microdiscectomy Dr. Clydell Hakim 1980s.  Negative for MI chest pain stroke.   Objective: Vital Signs: BP (!) 147/95   Pulse 83   Ht 5\' 7"  (1.702 m)   Wt 228 lb (103.4 kg)   BMI 35.71 kg/m   Physical Exam Constitutional:      Appearance: She is well-developed.  HENT:     Head: Normocephalic.     Right Ear: External ear normal.     Left Ear: External ear normal.  Eyes:     Pupils: Pupils are equal, round, and reactive to light.  Neck:     Thyroid: No thyromegaly.     Trachea: No tracheal deviation.  Cardiovascular:     Rate and Rhythm: Normal rate.  Pulmonary:     Effort: Pulmonary effort is normal.  Abdominal:     Palpations: Abdomen is soft.  Skin:    General: Skin is warm and dry.  Neurological:     Mental Status: She is alert and oriented to person, place, and time.  Psychiatric:  Behavior: Behavior normal.     Ortho Exam patient's lumbar incision shows a dime size area with yellow scab present no drainage on current dressing present since yesterday.  No cellulitis.  No sciatic notch tenderness.  With ambulation she has varus deformity of her left knee right knee is straight there is severe crepitus medial joint line tenderness.  ACL PCL exam is normal distal pulses are intact anterior tib gastrocsoleus is intact negative straight leg raising.  Specialty Comments:  No specialty comments available.  Imaging: No results found.   PMFS History: Patient Active Problem List   Diagnosis Date Noted  . Unilateral primary osteoarthritis, left knee 01/08/2019  . Wound infection 11/18/2018  . Lumbar stenosis 10/30/2018  . Herniated  nucleus pulposus, lumbar 10/30/2018  . Spinal stenosis of lumbar region 10/16/2018  . Lumbar herniated disc 10/14/2018  . Alcohol drinker 05/20/2015  . Anxiety and depression 05/20/2015  . History of artificial joint 05/20/2015  . HLD (hyperlipidemia) 05/20/2015  . Avitaminosis D 05/20/2015  . Chronic cough 01/20/2014  . Adiposity 01/18/2012  . Irregular bleeding 11/27/2006  . Tobacco use 06/23/2005   Past Medical History:  Diagnosis Date  . Anxiety   . Arthritis   . Depression   . Hyperlipidemia   . Lumbar stenosis     Family History  Problem Relation Age of Onset  . Lung cancer Father   . Healthy Brother   . Aneurysm Maternal Grandmother   . Lung cancer Maternal Grandfather   . Coronary artery disease Paternal Grandmother   . Diabetes Mother   . Hypertension Mother     Past Surgical History:  Procedure Laterality Date  . ANKLE SURGERY  01/17/2012   x3  . APPLICATION OF WOUND VAC N/A 11/18/2018   Procedure: Application Of Wound Vac;  Surgeon: Marybelle Killings, MD;  Location: [place];  Service: Orthopedics;  Laterality: N/A;  . BACK SURGERY    . BREAST BIOPSY Left 1990's  . CHOLECYSTECTOMY  2000  . I&D EXTREMITY N/A 11/18/2018   Procedure: EVACUATION OF LUMBAR SEROMA/HEMATOMA;  Surgeon: Marybelle Killings, MD;  Location: [place];  Service: Orthopedics;  Laterality: N/A;  . LAMINECTOMY  1996  . LUMBAR LAMINECTOMY N/A 10/30/2018   Procedure: left L3 & L4 hemilaminectomy, removal of herniated nucleus pulposus;  Surgeon: Marybelle Killings, MD;  Location: [place];  Service: Orthopedics;  Laterality: N/A;  . WRIST SURGERY Right    Social History   Occupational History  . Not on file  Tobacco Use  . Smoking status: Current Every Day Smoker    Packs/day: 0.25    Years: 20.00    Pack years: 5.00    Types: Cigarettes  . Smokeless tobacco: Never Used  Substance and Sexual Activity  . Alcohol use: Yes    Alcohol/week: 6.0 standard drinks    Types: 6 Cans of beer per week  . Drug use: Yes     Frequency: 7.0 times per week    Types: Marijuana    Comment: last use 10/22/2018  . Sexual activity: Not on file

## 2019-01-09 ENCOUNTER — Other Ambulatory Visit: Payer: Self-pay

## 2019-01-09 ENCOUNTER — Other Ambulatory Visit (HOSPITAL_COMMUNITY)
Admission: RE | Admit: 2019-01-09 | Discharge: 2019-01-09 | Disposition: A | Discharge status: Home / Self Care | Payer: BC Managed Care – PPO | Source: Ambulatory Visit | Attending: Family Medicine | Admitting: Family Medicine

## 2019-01-09 ENCOUNTER — Encounter: Payer: Self-pay | Admitting: Family Medicine

## 2019-01-09 ENCOUNTER — Other Ambulatory Visit: Payer: Self-pay | Admitting: Family Medicine

## 2019-01-09 ENCOUNTER — Ambulatory Visit (INDEPENDENT_AMBULATORY_CARE_PROVIDER_SITE_OTHER): Payer: BC Managed Care – PPO | Admitting: Family Medicine

## 2019-01-09 VITALS — BP 126/80 | HR 89 | Temp 96.9°F | Resp 16 | Wt 233.0 lb

## 2019-01-09 DIAGNOSIS — F329 Major depressive disorder, single episode, unspecified: Secondary | ICD-10-CM

## 2019-01-09 DIAGNOSIS — Z124 Encounter for screening for malignant neoplasm of cervix: Secondary | ICD-10-CM | POA: Insufficient documentation

## 2019-01-09 DIAGNOSIS — R0683 Snoring: Secondary | ICD-10-CM

## 2019-01-09 DIAGNOSIS — Z1231 Encounter for screening mammogram for malignant neoplasm of breast: Secondary | ICD-10-CM

## 2019-01-09 DIAGNOSIS — Z0000 Encounter for general adult medical examination without abnormal findings: Secondary | ICD-10-CM | POA: Diagnosis not present

## 2019-01-09 DIAGNOSIS — M1712 Unilateral primary osteoarthritis, left knee: Secondary | ICD-10-CM

## 2019-01-09 DIAGNOSIS — E782 Mixed hyperlipidemia: Secondary | ICD-10-CM

## 2019-01-09 DIAGNOSIS — Z23 Encounter for immunization: Secondary | ICD-10-CM

## 2019-01-09 DIAGNOSIS — Z1211 Encounter for screening for malignant neoplasm of colon: Secondary | ICD-10-CM

## 2019-01-09 DIAGNOSIS — M5126 Other intervertebral disc displacement, lumbar region: Secondary | ICD-10-CM | POA: Diagnosis not present

## 2019-01-09 DIAGNOSIS — F32A Depression, unspecified: Secondary | ICD-10-CM

## 2019-01-09 DIAGNOSIS — F419 Anxiety disorder, unspecified: Secondary | ICD-10-CM | POA: Diagnosis not present

## 2019-01-09 MED ORDER — DIAZEPAM 5 MG PO TABS: ORAL_TABLET | ORAL | 2 refills | Status: DC

## 2019-01-10 ENCOUNTER — Other Ambulatory Visit: Payer: Self-pay

## 2019-01-10 DIAGNOSIS — F329 Major depressive disorder, single episode, unspecified: Secondary | ICD-10-CM | POA: Diagnosis not present

## 2019-01-10 DIAGNOSIS — Z0000 Encounter for general adult medical examination without abnormal findings: Secondary | ICD-10-CM | POA: Diagnosis not present

## 2019-01-10 DIAGNOSIS — F419 Anxiety disorder, unspecified: Secondary | ICD-10-CM | POA: Diagnosis not present

## 2019-01-10 DIAGNOSIS — E782 Mixed hyperlipidemia: Secondary | ICD-10-CM | POA: Diagnosis not present

## 2019-01-10 DIAGNOSIS — Z1211 Encounter for screening for malignant neoplasm of colon: Secondary | ICD-10-CM | POA: Diagnosis not present

## 2019-01-11 LAB — CBC WITH DIFFERENTIAL/PLATELET
Basophils Absolute: 0.1 10*3/uL (ref 0.0–0.2)
Basos: 1 %
EOS (ABSOLUTE): 0.2 10*3/uL (ref 0.0–0.4)
Eos: 2 %
Hematocrit: 42.8 % (ref 34.0–46.6)
Hemoglobin: 13.9 g/dL (ref 11.1–15.9)
Immature Grans (Abs): 0 10*3/uL (ref 0.0–0.1)
Immature Granulocytes: 0 %
Lymphocytes Absolute: 2.9 10*3/uL (ref 0.7–3.1)
Lymphs: 34 %
MCH: 30 pg (ref 26.6–33.0)
MCHC: 32.5 g/dL (ref 31.5–35.7)
MCV: 92 fL (ref 79–97)
Monocytes Absolute: 0.4 10*3/uL (ref 0.1–0.9)
Monocytes: 5 %
Neutrophils Absolute: 4.8 10*3/uL (ref 1.4–7.0)
Neutrophils: 58 %
Platelets: 265 10*3/uL (ref 150–450)
RBC: 4.63 x10E6/uL (ref 3.77–5.28)
RDW: 12.9 % (ref 11.7–15.4)
WBC: 8.5 10*3/uL (ref 3.4–10.8)

## 2019-01-11 LAB — COMPREHENSIVE METABOLIC PANEL
ALT: 10 IU/L (ref 0–32)
AST: 10 IU/L (ref 0–40)
Albumin/Globulin Ratio: 2 (ref 1.2–2.2)
Albumin: 4.2 g/dL (ref 3.8–4.9)
Alkaline Phosphatase: 82 IU/L (ref 39–117)
BUN/Creatinine Ratio: 19 (ref 9–23)
BUN: 12 mg/dL (ref 6–24)
Bilirubin Total: 0.2 mg/dL (ref 0.0–1.2)
CO2: 21 mmol/L (ref 20–29)
Calcium: 9.6 mg/dL (ref 8.7–10.2)
Chloride: 102 mmol/L (ref 96–106)
Creatinine, Ser: 0.64 mg/dL (ref 0.57–1.00)
GFR calc Af Amer: 119 mL/min/{1.73_m2} (ref >59)
GFR calc non Af Amer: 103 mL/min/{1.73_m2} (ref >59)
Globulin, Total: 2.1 g/dL (ref 1.5–4.5)
Glucose: 106 mg/dL — ABNORMAL HIGH (ref 65–99)
Potassium: 4.8 mmol/L (ref 3.5–5.2)
Sodium: 139 mmol/L (ref 134–144)
Total Protein: 6.3 g/dL (ref 6.0–8.5)

## 2019-01-11 LAB — LIPID PANEL
Chol/HDL Ratio: 3.7 ratio (ref 0.0–4.4)
Cholesterol, Total: 191 mg/dL (ref 100–199)
HDL: 52 mg/dL (ref >39)
LDL Chol Calc (NIH): 108 mg/dL — ABNORMAL HIGH (ref 0–99)
Triglycerides: 177 mg/dL — ABNORMAL HIGH (ref 0–149)
VLDL Cholesterol Cal: 31 mg/dL (ref 5–40)

## 2019-01-11 LAB — TSH: TSH: 2.13 u[IU]/mL (ref 0.450–4.500)

## 2019-01-13 ENCOUNTER — Telehealth: Payer: Self-pay

## 2019-01-13 NOTE — Telephone Encounter (Signed)
-----   Message from Margo Common, [place] sent at 01/12/2019 10:33 PM EDT ----- Blood tests essentially normal except elevation of triglycerides above goal of <150. If this was a truly fasting blood test, will need to reduce fats in diet and consider using Flush-Free Niacin 500 mg qd. Recheck progress in 3 months.

## 2019-01-13 NOTE — Telephone Encounter (Signed)
Patient notified of lab results. Follow up appointment scheduled.

## 2019-01-14 ENCOUNTER — Other Ambulatory Visit: Payer: Self-pay

## 2019-01-14 ENCOUNTER — Ambulatory Visit
Admission: RE | Admit: 2019-01-14 | Discharge: 2019-01-14 | Disposition: A | Discharge status: Home / Self Care | Payer: BC Managed Care – PPO | Source: Ambulatory Visit | Attending: Family Medicine | Admitting: Family Medicine

## 2019-01-14 DIAGNOSIS — Z1231 Encounter for screening mammogram for malignant neoplasm of breast: Secondary | ICD-10-CM | POA: Diagnosis not present

## 2019-01-15 ENCOUNTER — Other Ambulatory Visit: Payer: Self-pay

## 2019-01-15 ENCOUNTER — Telehealth: Payer: Self-pay

## 2019-01-15 DIAGNOSIS — Z1211 Encounter for screening for malignant neoplasm of colon: Secondary | ICD-10-CM

## 2019-01-15 NOTE — Telephone Encounter (Signed)
Gastroenterology Pre-Procedure Review  Request Date: 01/31/19 Requesting Physician: Dr. Vicente Males  PATIENT REVIEW QUESTIONS: The patient responded to the following health history questions as indicated:    1. Are you having any GI issues? no 2. Do you have a personal history of Polyps? no 3. Do you have a family history of Colon Cancer or Polyps? no 4. Diabetes Mellitus? no 5. Joint replacements in the past 12 months?Back Surgery 10/30/2018, and 11/20/2018 6. Major health problems in the past 3 months?no 7. Any artificial heart valves, MVP, or defibrillator?no    MEDICATIONS & ALLERGIES:    Patient reports the following regarding taking any anticoagulation/antiplatelet therapy:   Plavix, Coumadin, Eliquis, Xarelto, Lovenox, Pradaxa, Brilinta, or Effient? no Aspirin? no  Patient confirms/reports the following medications:  Current Outpatient Medications  Medication Sig Dispense Refill  . diazepam (VALIUM) 5 MG tablet TAKE ONE TABLET (5 MG) BY MOUTH TWICE DAILY AS NEEDED FOR ANXIETY OR AGITATION 30 tablet 2  . doxycycline (VIBRAMYCIN) 50 MG capsule Take 2 capsules (100 mg total) by mouth 2 (two) times daily. 60 capsule 0  . gabapentin (NEURONTIN) 300 MG capsule TAKE 1 CAPSULE BY MOUTH 3 TIMES DAILY 90 capsule 3  . meloxicam (MOBIC) 7.5 MG tablet Take 1 tablet (7.5 mg total) by mouth 2 (two) times daily. (Patient taking differently: Take 15 mg by mouth daily. ) 60 tablet 6  . oxyCODONE-acetaminophen (PERCOCET) 5-325 MG tablet Take 1-2 tablets by mouth every 6 (six) hours as needed for severe pain. Post op lumbar pain 11/18/18 surgery (Patient not taking: Reported on 01/09/2019) 20 tablet 0  . sertraline (ZOLOFT) 100 MG tablet TAKE ONE TABLET EVERY DAY AT BEDTIME 30 tablet 6   No current facility-administered medications for this visit.     Patient confirms/reports the following allergies:  Allergies  Allergen Reactions  . Sulfa Antibiotics Hives    No orders of the defined types were  placed in this encounter.   AUTHORIZATION INFORMATION Primary Insurance: 1D#: Group #:  Secondary Insurance: 1D#: Group #:  SCHEDULE INFORMATION: Date: 01/31/19 Time: Location:[facility]

## 2019-01-17 ENCOUNTER — Other Ambulatory Visit: Payer: Self-pay | Admitting: Family Medicine

## 2019-01-17 DIAGNOSIS — R0683 Snoring: Secondary | ICD-10-CM

## 2019-01-28 ENCOUNTER — Other Ambulatory Visit: Payer: Self-pay

## 2019-01-28 ENCOUNTER — Other Ambulatory Visit
Admission: RE | Admit: 2019-01-28 | Discharge: 2019-01-28 | Disposition: A | Discharge status: Home / Self Care | Payer: BC Managed Care – PPO | Source: Ambulatory Visit | Attending: Gastroenterology | Admitting: Gastroenterology

## 2019-01-28 DIAGNOSIS — Z20828 Contact with and (suspected) exposure to other viral communicable diseases: Secondary | ICD-10-CM | POA: Diagnosis not present

## 2019-01-28 DIAGNOSIS — Z01812 Encounter for preprocedural laboratory examination: Secondary | ICD-10-CM | POA: Diagnosis not present

## 2019-01-29 ENCOUNTER — Telehealth: Payer: Self-pay | Admitting: Family Medicine

## 2019-01-29 LAB — SARS CORONAVIRUS 2 (TAT 6-24 HRS): SARS Coronavirus 2: NEGATIVE

## 2019-01-29 NOTE — Telephone Encounter (Signed)
I need office note completed to schedule pt's sleep study you ordered on 01/17/19.Thanks

## 2019-01-30 ENCOUNTER — Encounter: Payer: Self-pay | Admitting: *Deleted

## 2019-01-30 NOTE — Telephone Encounter (Signed)
Completed and patient reports Epworth Sleepiness Score of 12 with loud snoring. Thanks for the reminder.

## 2019-01-31 ENCOUNTER — Other Ambulatory Visit: Payer: Self-pay

## 2019-01-31 ENCOUNTER — Ambulatory Visit: Payer: BC Managed Care – PPO | Admitting: Certified Registered"

## 2019-01-31 ENCOUNTER — Ambulatory Visit
Admission: RE | Admit: 2019-01-31 | Discharge: 2019-01-31 | Disposition: A | Discharge status: Home / Self Care | Payer: BC Managed Care – PPO | Attending: Gastroenterology | Admitting: Gastroenterology

## 2019-01-31 ENCOUNTER — Encounter
Admission: RE | Disposition: A | Discharge status: Home / Self Care | Payer: Self-pay | Source: Home / Self Care | Attending: Gastroenterology

## 2019-01-31 DIAGNOSIS — Z79899 Other long term (current) drug therapy: Secondary | ICD-10-CM | POA: Insufficient documentation

## 2019-01-31 DIAGNOSIS — M48061 Spinal stenosis, lumbar region without neurogenic claudication: Secondary | ICD-10-CM | POA: Diagnosis not present

## 2019-01-31 DIAGNOSIS — Z803 Family history of malignant neoplasm of breast: Secondary | ICD-10-CM | POA: Diagnosis not present

## 2019-01-31 DIAGNOSIS — F419 Anxiety disorder, unspecified: Secondary | ICD-10-CM | POA: Insufficient documentation

## 2019-01-31 DIAGNOSIS — K5790 Diverticulosis of intestine, part unspecified, without perforation or abscess without bleeding: Secondary | ICD-10-CM | POA: Diagnosis not present

## 2019-01-31 DIAGNOSIS — Z1211 Encounter for screening for malignant neoplasm of colon: Secondary | ICD-10-CM | POA: Insufficient documentation

## 2019-01-31 DIAGNOSIS — Z882 Allergy status to sulfonamides status: Secondary | ICD-10-CM | POA: Diagnosis not present

## 2019-01-31 DIAGNOSIS — E785 Hyperlipidemia, unspecified: Secondary | ICD-10-CM | POA: Insufficient documentation

## 2019-01-31 DIAGNOSIS — Z8249 Family history of ischemic heart disease and other diseases of the circulatory system: Secondary | ICD-10-CM | POA: Diagnosis not present

## 2019-01-31 DIAGNOSIS — K5730 Diverticulosis of large intestine without perforation or abscess without bleeding: Secondary | ICD-10-CM | POA: Diagnosis not present

## 2019-01-31 DIAGNOSIS — M1990 Unspecified osteoarthritis, unspecified site: Secondary | ICD-10-CM | POA: Insufficient documentation

## 2019-01-31 DIAGNOSIS — Z800 Family history of malignant neoplasm of digestive organs: Secondary | ICD-10-CM | POA: Insufficient documentation

## 2019-01-31 DIAGNOSIS — Z801 Family history of malignant neoplasm of trachea, bronchus and lung: Secondary | ICD-10-CM | POA: Diagnosis not present

## 2019-01-31 DIAGNOSIS — Z791 Long term (current) use of non-steroidal anti-inflammatories (NSAID): Secondary | ICD-10-CM | POA: Insufficient documentation

## 2019-01-31 DIAGNOSIS — K635 Polyp of colon: Secondary | ICD-10-CM | POA: Diagnosis not present

## 2019-01-31 DIAGNOSIS — F329 Major depressive disorder, single episode, unspecified: Secondary | ICD-10-CM | POA: Insufficient documentation

## 2019-01-31 DIAGNOSIS — Z833 Family history of diabetes mellitus: Secondary | ICD-10-CM | POA: Insufficient documentation

## 2019-01-31 DIAGNOSIS — F17210 Nicotine dependence, cigarettes, uncomplicated: Secondary | ICD-10-CM | POA: Diagnosis not present

## 2019-01-31 DIAGNOSIS — D122 Benign neoplasm of ascending colon: Secondary | ICD-10-CM | POA: Insufficient documentation

## 2019-01-31 DIAGNOSIS — Z9049 Acquired absence of other specified parts of digestive tract: Secondary | ICD-10-CM | POA: Insufficient documentation

## 2019-01-31 HISTORY — PX: COLONOSCOPY WITH PROPOFOL: SHX5780

## 2019-01-31 SURGERY — COLONOSCOPY WITH PROPOFOL
Anesthesia: General

## 2019-01-31 MED ORDER — PROPOFOL 10 MG/ML IV BOLUS
INTRAVENOUS | Status: DC | PRN
  Administered 2019-01-31: 30 mg via INTRAVENOUS
  Administered 2019-01-31: 100 mg via INTRAVENOUS
  Administered 2019-01-31 (×2): 30 mg via INTRAVENOUS

## 2019-01-31 MED ORDER — SODIUM CHLORIDE 0.9 % IV SOLN
INTRAVENOUS | Status: DC
  Administered 2019-01-31: 11:00:00 via INTRAVENOUS

## 2019-01-31 MED ORDER — LIDOCAINE HCL (CARDIAC) PF 100 MG/5ML IV SOSY
PREFILLED_SYRINGE | INTRAVENOUS | Status: DC | PRN
  Administered 2019-01-31: 100 mg via INTRAVENOUS

## 2019-01-31 NOTE — H&P (Signed)
Emily Bellows, MD [address], [place], [place], [place], [address] [address], [place], [place], [place], [address] Phone: [phone]  Fax: [phone]  Primary Care Physician:  Margo Common, [place]   Pre-Procedure History & Physical: HPI:  Emily Bender is a 53 y.o. female is here for an colonoscopy.   Past Medical History:  Diagnosis Date  . Anxiety   . Arthritis   . Depression   . Hyperlipidemia   . Lumbar stenosis     Past Surgical History:  Procedure Laterality Date  . ANKLE SURGERY  01/17/2012   x3  . APPLICATION OF WOUND VAC N/A 11/18/2018   Procedure: Application Of Wound Vac;  Surgeon: Marybelle Killings, MD;  Location: [place];  Service: Orthopedics;  Laterality: N/A;  . BACK SURGERY    . BREAST BIOPSY Left 1990's   neg  . CHOLECYSTECTOMY  2000  . I&D EXTREMITY N/A 11/18/2018   Procedure: EVACUATION OF LUMBAR SEROMA/HEMATOMA;  Surgeon: Marybelle Killings, MD;  Location: [place];  Service: Orthopedics;  Laterality: N/A;  . LAMINECTOMY  1996  . LUMBAR LAMINECTOMY N/A 10/30/2018   Procedure: left L3 & L4 hemilaminectomy, removal of herniated nucleus pulposus;  Surgeon: Marybelle Killings, MD;  Location: [place];  Service: Orthopedics;  Laterality: N/A;  . WRIST SURGERY Right     Prior to Admission medications   Medication Sig Start Date End Date Taking? Authorizing Provider  diazepam (VALIUM) 5 MG tablet TAKE ONE TABLET (5 MG) BY MOUTH TWICE DAILY AS NEEDED FOR ANXIETY OR AGITATION 01/09/19  Yes Chrismon, Vickki Muff, PA  gabapentin (NEURONTIN) 300 MG capsule TAKE 1 CAPSULE BY MOUTH 3 TIMES DAILY 11/22/18  Yes Chrismon, Vickki Muff, PA  meloxicam (MOBIC) 7.5 MG tablet Take 1 tablet (7.5 mg total) by mouth 2 (two) times daily. Patient taking differently: Take 15 mg by mouth daily.  08/15/18  Yes Chrismon, Vickki Muff, PA  sertraline (ZOLOFT) 100 MG tablet TAKE ONE TABLET EVERY DAY AT BEDTIME 12/05/18  Yes Chrismon, Vickki Muff, PA  doxycycline (VIBRAMYCIN) 50 MG capsule Take 2 capsules  (100 mg total) by mouth 2 (two) times daily. 12/25/18   Marybelle Killings, MD  oxyCODONE-acetaminophen (PERCOCET) 5-325 MG tablet Take 1-2 tablets by mouth every 6 (six) hours as needed for severe pain. Post op lumbar pain 11/18/18 surgery Patient not taking: Reported on 01/09/2019 12/17/18 12/17/19  Marybelle Killings, MD    Allergies as of 01/16/2019 - Review Complete 01/09/2019  Allergen Reaction Noted  . Sulfa antibiotics Hives 05/20/2015    Family History  Problem Relation Age of Onset  . Lung cancer Father   . Healthy Brother   . Aneurysm Maternal Grandmother   . Breast cancer Maternal Grandmother        mat great gm  . Lung cancer Maternal Grandfather   . Coronary artery disease Paternal Grandmother   . Breast cancer Paternal Grandmother   . Diabetes Mother   . Hypertension Mother     Social History   Socioeconomic History  . Marital status: Single    Spouse name: Not on file  . Number of children: Not on file  . Years of education: Not on file  . Highest education level: Not on file  Occupational History  . Not on file  Social Needs  . Financial resource strain: Not hard at all  . Food insecurity    Worry: Never true    Inability: Never true  .  Transportation needs    Medical: No    Non-medical: No  Tobacco Use  . Smoking status: Current Every Day Smoker    Packs/day: 0.25    Years: 20.00    Pack years: 5.00    Types: Cigarettes  . Smokeless tobacco: Never Used  Substance and Sexual Activity  . Alcohol use: Yes    Alcohol/week: 6.0 standard drinks    Types: 6 Cans of beer per week    Comment: none last hrs  . Drug use: Yes    Frequency: 7.0 times per week    Types: Marijuana    Comment: last use 10/22/2018  . Sexual activity: Not on file  Lifestyle  . Physical activity    Days per week: Not on file    Minutes per session: Not on file  . Stress: Not on file  Relationships  . Social Herbalist on phone: Not on file    Gets together: Not on file     Attends religious service: Not on file    Active member of club or organization: Not on file    Attends meetings of clubs or organizations: Not on file    Relationship status: Not on file  . Intimate partner violence    Fear of current or ex partner: Not on file    Emotionally abused: Not on file    Physically abused: Not on file    Forced sexual activity: Not on file  Other Topics Concern  . Not on file  Social History Narrative  . Not on file    Review of Systems: See HPI, otherwise negative ROS  Physical Exam: LMP 01/31/1999  General:   Alert,  pleasant and cooperative in NAD Head:  Normocephalic and atraumatic. Neck:  Supple; no masses or thyromegaly. Lungs:  Clear throughout to auscultation, normal respiratory effort.    Heart:  +S1, +S2, Regular rate and rhythm, No edema. Abdomen:  Soft, nontender and nondistended. Normal bowel sounds, without guarding, and without rebound.   Neurologic:  Alert and  oriented x4;  grossly normal neurologically.  Impression/Plan: [name] is here for an colonoscopy to be performed for Screening colonoscopy average risk   Risks, benefits, limitations, and alternatives regarding  colonoscopy have been reviewed with the patient.  Questions have been answered.  All parties agreeable.   Emily Bellows, MD  01/31/2019, 10:49 AM

## 2019-01-31 NOTE — Anesthesia Post-op Follow-up Note (Signed)
Anesthesia QCDR form completed.        

## 2019-01-31 NOTE — Op Note (Signed)
[facility] Gastroenterology Patient Name: Emily Bender Procedure Date: 01/31/2019 10:49 AM MRN: [id number] Account #: [id number] Date of Birth: July 23, 1965 Admit Type: Outpatient Age: 53 Room: [facility] ENDO ROOM 4 Gender: Female Note Status: Finalized Procedure:            Colonoscopy Indications:          Screening for colorectal malignant neoplasm Providers:            Jonathon Bellows MD, MD Medicines:            Monitored Anesthesia Care Complications:        No immediate complications. Procedure:            Pre-Anesthesia Assessment:                       - Prior to the procedure, a History and Physical was                        performed, and patient medications, allergies and                        sensitivities were reviewed. The patient's tolerance of                        previous anesthesia was reviewed.                       - The risks and benefits of the procedure and the                        sedation options and risks were discussed with the                        patient. All questions were answered and informed                        consent was obtained.                       - ASA Grade Assessment: II - A patient with mild                        systemic disease.                       After obtaining informed consent, the colonoscope was                        passed under direct vision. Throughout the procedure,                        the patient's blood pressure, pulse, and oxygen                        saturations were monitored continuously. The                        Colonoscope was introduced through the anus and                        advanced to the the cecum, identified by the  appendiceal orifice. The colonoscopy was performed with                        ease. The patient tolerated the procedure well. The                        quality of the bowel preparation was good. Findings:      The perianal and digital rectal  examinations were normal.      Two sessile polyps were found in the proximal ascending colon. The       polyps were 4 to 6 mm in size. These polyps were removed with a cold       snare. Resection and retrieval were complete.      Multiple small-mouthed diverticula were found in the sigmoid colon.      The exam was otherwise without abnormality on direct and retroflexion       views. Impression:           - Two 4 to 6 mm polyps in the proximal ascending colon,                        removed with a cold snare. Resected and retrieved.                       - Diverticulosis in the sigmoid colon.                       - The examination was otherwise normal on direct and                        retroflexion views. Recommendation:       - Discharge patient to home (with escort).                       - Resume previous diet.                       - Continue present medications.                       - Await pathology results.                       - Repeat colonoscopy for surveillance based on                        pathology results. Procedure Code(s):    --- Professional ---                       [id number], Colonoscopy, flexible; with removal of tumor(s),                        polyp(s), or other lesion(s) by snare technique Diagnosis Code(s):    --- Professional ---                       Z12.11, Encounter for screening for malignant neoplasm                        of colon  K57.30, Diverticulosis of large intestine without                        perforation or abscess without bleeding                       K63.5, Polyp of colon CPT copyright 2019 American Medical Association. All rights reserved. The codes documented in this report are preliminary and upon coder review may  be revised to meet current compliance requirements. Jonathon Bellows, MD Jonathon Bellows MD, MD 01/31/2019 11:23:51 AM This report has been signed electronically. Number of Addenda: 0 Note Initiated On:  01/31/2019 10:49 AM Scope Withdrawal Time: 0 hours 8 minutes 16 seconds  Total Procedure Duration: 0 hours 11 minutes 22 seconds  Estimated Blood Loss: Estimated blood loss: none.      [facility]

## 2019-01-31 NOTE — Anesthesia Preprocedure Evaluation (Signed)
Anesthesia Evaluation  Patient identified by MRN, date of birth, ID band Patient awake    Reviewed: Allergy & Precautions, H&P , NPO status , Patient's Chart, lab work & pertinent test results  History of Anesthesia Complications Negative for: history of anesthetic complications  Airway Mallampati: II  TM Distance: >3 FB Neck ROM: Full    Dental  (+) Teeth Intact, Dental Advisory Given, Missing   Pulmonary neg shortness of breath, neg recent URI, Current Smoker and Patient abstained from smoking.,    Pulmonary exam normal        Cardiovascular negative cardio ROS   Rhythm:Regular Rate:Normal     Neuro/Psych PSYCHIATRIC DISORDERS Anxiety Depression negative neurological ROS     GI/Hepatic negative GI ROS, Neg liver ROS,   Endo/Other  negative endocrine ROS  Renal/GU negative Renal ROS  negative genitourinary   Musculoskeletal  (+) Arthritis , Osteoarthritis,    Abdominal   Peds  Hematology negative hematology ROS (+)   Anesthesia Other Findings Past Medical History: No date: Anxiety No date: Arthritis No date: Depression No date: Hyperlipidemia No date: Lumbar stenosis  Obesity  Reproductive/Obstetrics negative OB ROS                             Anesthesia Physical  Anesthesia Plan  ASA: II  Anesthesia Plan: General   Post-op Pain Management:    Induction: Intravenous  PONV Risk Score and Plan: 3 and Propofol infusion and TIVA  Airway Management Planned: Natural Airway and Nasal Cannula  Additional Equipment:   Intra-op Plan:   Post-operative Plan:   Informed Consent: I have reviewed the patients History and Physical, chart, labs and discussed the procedure including the risks, benefits and alternatives for the proposed anesthesia with the patient or authorized representative who has indicated his/her understanding and acceptance.     Dental advisory  given  Plan Discussed with: CRNA  Anesthesia Plan Comments:         Anesthesia Quick Evaluation

## 2019-01-31 NOTE — Transfer of Care (Signed)
Immediate Anesthesia Transfer of Care Note  Patient: Emily Bender  Procedure(s) Performed: COLONOSCOPY WITH PROPOFOL (N/A )  Patient Location: Endoscopy Unit  Anesthesia Type:General  Level of Consciousness: awake and alert   Airway & Oxygen Therapy: Patient Spontanous Breathing and Patient connected to nasal cannula oxygen  Post-op Assessment: Report given to RN, Post -op Vital signs reviewed and stable and Patient moving all extremities  Post vital signs: Reviewed and stable  Last Vitals:  Vitals Value Taken Time  BP 140/82 01/31/19 1126  Temp    Pulse 75 01/31/19 1126  Resp 22 01/31/19 1126  SpO2 94 % 01/31/19 1126  Vitals shown include unvalidated device data.  Last Pain:  Vitals:   01/31/19 1126  TempSrc:   PainSc: 0-No pain         Complications: No apparent anesthesia complications

## 2019-02-01 NOTE — Anesthesia Postprocedure Evaluation (Signed)
Anesthesia Post Note  Patient: Emily Bender  Procedure(s) Performed: COLONOSCOPY WITH PROPOFOL (N/A )  Patient location during evaluation: Endoscopy Anesthesia Type: General Level of consciousness: awake and alert Pain management: pain level controlled Vital Signs Assessment: post-procedure vital signs reviewed and stable Respiratory status: spontaneous breathing, nonlabored ventilation, respiratory function stable and patient connected to nasal cannula oxygen Cardiovascular status: blood pressure returned to baseline and stable Postop Assessment: no apparent nausea or vomiting Anesthetic complications: no     Last Vitals:  Vitals:   01/31/19 1101 01/31/19 1126  BP: (!) 129/91 140/82  Pulse: 86   Resp: 16   Temp: 36.8 C 36.4 C  SpO2: 97%     Last Pain:  Vitals:   01/31/19 1146  TempSrc:   PainSc: 0-No pain                 Martha Clan

## 2019-02-03 ENCOUNTER — Encounter: Payer: Self-pay | Admitting: Gastroenterology

## 2019-02-03 LAB — SURGICAL PATHOLOGY

## 2019-02-04 DIAGNOSIS — G4733 Obstructive sleep apnea (adult) (pediatric): Secondary | ICD-10-CM | POA: Diagnosis not present

## 2019-02-04 DIAGNOSIS — R0602 Shortness of breath: Secondary | ICD-10-CM | POA: Diagnosis not present

## 2019-02-04 LAB — PULMONARY FUNCTION TEST

## 2019-02-05 DIAGNOSIS — R0602 Shortness of breath: Secondary | ICD-10-CM | POA: Diagnosis not present

## 2019-02-05 DIAGNOSIS — G4733 Obstructive sleep apnea (adult) (pediatric): Secondary | ICD-10-CM | POA: Diagnosis not present

## 2019-02-09 ENCOUNTER — Encounter: Payer: Self-pay | Admitting: Gastroenterology

## 2019-02-17 NOTE — Pre-Procedure Instructions (Signed)
[facility] - [place], [place] [address] [place] [address] Phone: [phone] Fax: [phone]    Your procedure is scheduled on Wed., Nov. 11, 2020 from 12:30PM-2:37PM  Report to [facility] Entrance "A" at 10:30AM  Call this number if you have problems the morning of surgery:  [phone]   Remember:  Do not eat or drink after midnight on Nov. 10th    Take these medicines the morning of surgery with A SIP OF WATER: Gabapentin (NEURONTIN)   If Needed:  Diazepam (VALIUM)   7 days before surgery (02/19/19), stop taking all Aspirin (unless instructed by your doctor) and Other Aspirin containing products, Vitamins, Fish oils, and Herbal medications. Also stop all NSAIDS i.e. Advil, Ibuprofen, Motrin, Aleve, Anaprox, Naproxen, BC, Goody Powders, and all Supplements. Including: Meloxicam (MOBIC)  No Smoking of any kind, Tobacco, or Alcohol products 24 hours prior to your procedure. If you use a Cpap at night, you may bring all equipment for your overnight stay.   Special instructions: [facility]- Preparing For Surgery  Before surgery, you can play an important role. Because skin is not sterile, your skin needs to be as free of germs as possible. You can reduce the number of germs on your skin by washing with CHG (chlorahexidine gluconate) Soap before surgery.  CHG is an antiseptic cleaner which kills germs and bonds with the skin to continue killing germs even after washing.   Please do not use if you have an allergy to CHG or antibacterial soaps. If your skin becomes reddened/irritated stop using the CHG.  Do not shave (including legs and underarms) for at least 48 hours prior to first CHG shower. It is OK to shave your face.  Please follow these instructions carefully.   1. Shower the NIGHT BEFORE SURGERY and the MORNING OF SURGERY with CHG.   2. If you chose to wash your hair, wash your hair first as usual with your normal  shampoo.  3. After you shampoo, rinse your hair and body thoroughly to remove the shampoo.  4. Use CHG as you would any other liquid soap. You can apply CHG directly to the skin and wash gently with a scrungie or a clean washcloth.   5. Apply the CHG Soap to your body ONLY FROM THE NECK DOWN.  Do not use on open wounds or open sores. Avoid contact with your eyes, ears, mouth and genitals (private parts). Wash Face and genitals (private parts)  with your normal soap.  6. Wash thoroughly, paying special attention to the area where your surgery will be performed.  7. Thoroughly rinse your body with warm water from the neck down.  8. DO NOT shower/wash with your normal soap after using and rinsing off the CHG Soap.  9. Pat yourself dry with a CLEAN TOWEL.  10. Wear CLEAN PAJAMAS to bed the night before surgery, wear comfortable clothes the morning of surgery  11. Place CLEAN SHEETS on your bed the night of your first shower and DO NOT SLEEP WITH PETS.   Day of Surgery:             Remember to brush your teeth WITH YOUR REGULAR TOOTHPASTE.  Do not wear jewelry, make-up or nail polish.  Do not wear lotions, powders, or perfumes, or deodorant.  Do not shave 48 hours prior to surgery.    Do not bring valuables to the hospital.  [facility] is not responsible for any belongings  or valuables.  Contacts, dentures or bridgework may not be worn into surgery.    For patients admitted to the hospital, discharge time will be determined by your treatment team.  Patients discharged the day of surgery will not be allowed to drive home, and someone age 52 and over needs to stay with them for 24 hours.   Please wear clean clothes to the hospital/surgery center.    Please read over the following fact sheets that you were given.

## 2019-02-18 ENCOUNTER — Encounter (HOSPITAL_COMMUNITY)
Admission: RE | Admit: 2019-02-18 | Discharge: 2019-02-18 | Disposition: A | Discharge status: Home / Self Care | Payer: BC Managed Care – PPO | Source: Ambulatory Visit | Attending: Orthopaedic Surgery | Admitting: Orthopaedic Surgery

## 2019-02-18 ENCOUNTER — Encounter (HOSPITAL_COMMUNITY): Payer: Self-pay

## 2019-02-18 ENCOUNTER — Other Ambulatory Visit: Payer: Self-pay

## 2019-02-18 DIAGNOSIS — Z01812 Encounter for preprocedural laboratory examination: Secondary | ICD-10-CM | POA: Insufficient documentation

## 2019-02-18 HISTORY — DX: Sleep apnea, unspecified: G47.30

## 2019-02-18 LAB — CBC
HCT: 47.4 % — ABNORMAL HIGH (ref 36.0–46.0)
Hemoglobin: 15.2 g/dL — ABNORMAL HIGH (ref 12.0–15.0)
MCH: 30.2 pg (ref 26.0–34.0)
MCHC: 32.1 g/dL (ref 30.0–36.0)
MCV: 94.2 fL (ref 80.0–100.0)
Platelets: 305 10*3/uL (ref 150–400)
RBC: 5.03 MIL/uL (ref 3.87–5.11)
RDW: 13.3 % (ref 11.5–15.5)
WBC: 9.8 10*3/uL (ref 4.0–10.5)
nRBC: 0 % (ref 0.0–0.2)

## 2019-02-18 LAB — COMPREHENSIVE METABOLIC PANEL
ALT: 14 U/L (ref 0–44)
AST: 14 U/L — ABNORMAL LOW (ref 15–41)
Albumin: 4.1 g/dL (ref 3.5–5.0)
Alkaline Phosphatase: 71 U/L (ref 38–126)
Anion gap: 11 (ref 5–15)
BUN: 9 mg/dL (ref 6–20)
CO2: 24 mmol/L (ref 22–32)
Calcium: 9.7 mg/dL (ref 8.9–10.3)
Chloride: 101 mmol/L (ref 98–111)
Creatinine, Ser: 0.68 mg/dL (ref 0.44–1.00)
GFR calc Af Amer: >60 mL/min (ref >60)
GFR calc non Af Amer: >60 mL/min (ref >60)
Glucose, Bld: 107 mg/dL — ABNORMAL HIGH (ref 70–99)
Potassium: 3.9 mmol/L (ref 3.5–5.1)
Sodium: 136 mmol/L (ref 135–145)
Total Bilirubin: 0.6 mg/dL (ref 0.3–1.2)
Total Protein: 7.1 g/dL (ref 6.5–8.1)

## 2019-02-18 LAB — SURGICAL PCR SCREEN
MRSA, PCR: NEGATIVE
Staphylococcus aureus: NEGATIVE

## 2019-02-18 NOTE — Progress Notes (Addendum)
PCP - PA Simona Huh Chrismon  Cardiologist - Denies  Chest x-ray - Denies  EKG - Denies  Stress Test - Denies  ECHO - Denies  Cardiac Cath - Denies  AICD-na PM-na LOOP-na  Sleep Study - Yes- Positive CPAP - Waiting for machine-will bring dos  LABS- 02/18/2019: CBC, CMP, PCR 02/22/2019: COVID  ASA- Denies  ERAS- No  Pt's bp 169/94 prior to appt. Repeat 180/100, 87 (L) and 162/94, 88 (R). Pt denies ha or dizziness. Pt sts her bp always goes up when she goes to the doctor. Pt sts her pcp is aware. Explained to pt that if her bp is elevated dos, that she could be cancelled.   Anesthesia- Yes- elevated bp  Pt denies having chest pain, sob, or fever at this time. All instructions explained to the pt, with a verbal understanding of the material. Pt agrees to go over the instructions while at home for a better understanding. Pt also instructed to self quarantine after being tested for COVID-19. The opportunity to ask questions was provided.   Coronavirus Screening  Have you experienced the following symptoms:  Cough yes/no: No Fever (>100.73F)  yes/no: No Runny nose yes/no: No Sore throat yes/no: No Difficulty breathing/shortness of breath  yes/no: No  Have you or a family member traveled in the last 14 days and where? yes/no: No   If the patient indicates "YES" to the above questions, their PAT will be rescheduled to limit the exposure to others and, the surgeon will be notified. THE PATIENT WILL NEED TO BE ASYMPTOMATIC FOR 14 DAYS.   If the patient is not experiencing any of these symptoms, the PAT nurse will instruct them to NOT bring anyone with them to their appointment since they may have these symptoms or traveled as well.   Please remind your patients and families that hospital visitation restrictions are in effect and the importance of the restrictions.

## 2019-02-19 NOTE — Anesthesia Preprocedure Evaluation (Addendum)
Anesthesia Evaluation  Patient identified by MRN, date of birth, ID band Patient awake    Reviewed: Allergy & Precautions, NPO status , Patient's Chart, lab work & pertinent test results  Airway Mallampati: I  TM Distance: >3 FB Neck ROM: Full    Dental no notable dental hx. (+) Teeth Intact, Dental Advisory Given   Pulmonary sleep apnea , Current Smoker and Patient abstained from smoking.,    Pulmonary exam normal breath sounds clear to auscultation       Cardiovascular Normal cardiovascular exam Rhythm:Regular Rate:Normal  HLD   Neuro/Psych PSYCHIATRIC DISORDERS Anxiety Depression    GI/Hepatic (+)     substance abuse  alcohol use and marijuana use,   Endo/Other    Renal/GU      Musculoskeletal  (+) Arthritis ,   Abdominal   Peds  Hematology   Anesthesia Other Findings   Reproductive/Obstetrics                           Anesthesia Physical Anesthesia Plan  ASA: II  Anesthesia Plan: Spinal and Regional   Post-op Pain Management:  Regional for Post-op pain   Induction:   PONV Risk Score and Plan: 2 and Treatment may vary due to age or medical condition, Midazolam, Ondansetron and Dexamethasone  Airway Management Planned: Natural Airway  Additional Equipment:   Intra-op Plan:   Post-operative Plan:   Informed Consent: I have reviewed the patients History and Physical, chart, labs and discussed the procedure including the risks, benefits and alternatives for the proposed anesthesia with the patient or authorized representative who has indicated his/her understanding and acceptance.     Dental advisory given  Plan Discussed with: CRNA  Anesthesia Plan Comments:    Anesthesia Quick Evaluation

## 2019-02-22 ENCOUNTER — Other Ambulatory Visit (HOSPITAL_COMMUNITY)
Admission: RE | Admit: 2019-02-22 | Discharge: 2019-02-22 | Disposition: A | Discharge status: Home / Self Care | Payer: BC Managed Care – PPO | Source: Ambulatory Visit | Attending: Orthopaedic Surgery | Admitting: Orthopaedic Surgery

## 2019-02-22 DIAGNOSIS — Z01812 Encounter for preprocedural laboratory examination: Secondary | ICD-10-CM | POA: Diagnosis not present

## 2019-02-22 DIAGNOSIS — Z20828 Contact with and (suspected) exposure to other viral communicable diseases: Secondary | ICD-10-CM | POA: Diagnosis not present

## 2019-02-23 LAB — NOVEL CORONAVIRUS, NAA (HOSP ORDER, SEND-OUT TO REF LAB; TAT 18-24 HRS): SARS-CoV-2, NAA: NOT DETECTED

## 2019-02-25 MED ORDER — BUPIVACAINE LIPOSOME 1.3 % IJ SUSP
20.0000 mL | Freq: Once | INTRAMUSCULAR | Status: DC
  Filled 2019-02-25: qty 20

## 2019-02-26 ENCOUNTER — Encounter (HOSPITAL_COMMUNITY)
Admission: RE | Disposition: A | Discharge status: Home / Self Care | Payer: Self-pay | Source: Home / Self Care | Attending: Orthopaedic Surgery

## 2019-02-26 ENCOUNTER — Observation Stay (HOSPITAL_COMMUNITY)
Admission: RE | Admit: 2019-02-26 | Discharge: 2019-02-28 | Disposition: A | Discharge status: Home / Self Care | Payer: BC Managed Care – PPO | Attending: Orthopaedic Surgery | Admitting: Orthopaedic Surgery

## 2019-02-26 ENCOUNTER — Other Ambulatory Visit: Payer: Self-pay

## 2019-02-26 ENCOUNTER — Observation Stay (HOSPITAL_COMMUNITY): Payer: BC Managed Care – PPO

## 2019-02-26 ENCOUNTER — Ambulatory Visit (HOSPITAL_COMMUNITY): Payer: BC Managed Care – PPO | Admitting: Certified Registered Nurse Anesthetist

## 2019-02-26 ENCOUNTER — Encounter (HOSPITAL_COMMUNITY): Payer: Self-pay

## 2019-02-26 ENCOUNTER — Ambulatory Visit (HOSPITAL_COMMUNITY): Payer: BC Managed Care – PPO | Admitting: Physician Assistant

## 2019-02-26 DIAGNOSIS — F419 Anxiety disorder, unspecified: Secondary | ICD-10-CM | POA: Diagnosis not present

## 2019-02-26 DIAGNOSIS — M1712 Unilateral primary osteoarthritis, left knee: Secondary | ICD-10-CM | POA: Diagnosis not present

## 2019-02-26 DIAGNOSIS — Z09 Encounter for follow-up examination after completed treatment for conditions other than malignant neoplasm: Secondary | ICD-10-CM

## 2019-02-26 DIAGNOSIS — F17210 Nicotine dependence, cigarettes, uncomplicated: Secondary | ICD-10-CM | POA: Diagnosis not present

## 2019-02-26 DIAGNOSIS — Z96652 Presence of left artificial knee joint: Secondary | ICD-10-CM | POA: Diagnosis not present

## 2019-02-26 DIAGNOSIS — E785 Hyperlipidemia, unspecified: Secondary | ICD-10-CM | POA: Diagnosis not present

## 2019-02-26 DIAGNOSIS — Z882 Allergy status to sulfonamides status: Secondary | ICD-10-CM | POA: Diagnosis not present

## 2019-02-26 DIAGNOSIS — M21162 Varus deformity, not elsewhere classified, left knee: Secondary | ICD-10-CM | POA: Diagnosis not present

## 2019-02-26 DIAGNOSIS — F329 Major depressive disorder, single episode, unspecified: Secondary | ICD-10-CM | POA: Diagnosis not present

## 2019-02-26 DIAGNOSIS — G4730 Sleep apnea, unspecified: Secondary | ICD-10-CM | POA: Insufficient documentation

## 2019-02-26 DIAGNOSIS — Z79899 Other long term (current) drug therapy: Secondary | ICD-10-CM | POA: Diagnosis not present

## 2019-02-26 DIAGNOSIS — Z471 Aftercare following joint replacement surgery: Secondary | ICD-10-CM | POA: Diagnosis not present

## 2019-02-26 DIAGNOSIS — G8918 Other acute postprocedural pain: Secondary | ICD-10-CM | POA: Diagnosis not present

## 2019-02-26 HISTORY — PX: TOTAL KNEE ARTHROPLASTY: SHX125

## 2019-02-26 SURGERY — ARTHROPLASTY, KNEE, TOTAL
Anesthesia: Regional | Site: Knee | Laterality: Left

## 2019-02-26 MED ORDER — GABAPENTIN 300 MG PO CAPS
300.0000 mg | ORAL_CAPSULE | Freq: Three times a day (TID) | ORAL | Status: DC
  Administered 2019-02-26 – 2019-02-28 (×6): 300 mg via ORAL
  Filled 2019-02-26 (×6): qty 1

## 2019-02-26 MED ORDER — FENTANYL CITRATE (PF) 100 MCG/2ML IJ SOLN
INTRAMUSCULAR | Status: AC
  Filled 2019-02-26: qty 2

## 2019-02-26 MED ORDER — TRANEXAMIC ACID 1000 MG/10ML IV SOLN
INTRAVENOUS | Status: DC | PRN
  Administered 2019-02-26: 2000 mg via TOPICAL

## 2019-02-26 MED ORDER — FENTANYL CITRATE (PF) 100 MCG/2ML IJ SOLN
50.0000 ug | Freq: Once | INTRAMUSCULAR | Status: AC
  Administered 2019-02-26: 12:00:00 50 ug via INTRAVENOUS

## 2019-02-26 MED ORDER — FENTANYL CITRATE (PF) 100 MCG/2ML IJ SOLN
INTRAMUSCULAR | Status: AC
  Administered 2019-02-26: 50 ug via INTRAVENOUS
  Filled 2019-02-26: qty 2

## 2019-02-26 MED ORDER — ROCURONIUM BROMIDE 10 MG/ML (PF) SYRINGE
PREFILLED_SYRINGE | INTRAVENOUS | Status: AC
  Filled 2019-02-26: qty 10

## 2019-02-26 MED ORDER — FLEET ENEMA 7-19 GM/118ML RE ENEM: 1.0000 | ENEMA | Freq: Once | RECTAL | Status: DC | PRN

## 2019-02-26 MED ORDER — ACETAMINOPHEN 325 MG PO TABS
325.0000 mg | ORAL_TABLET | Freq: Four times a day (QID) | ORAL | Status: DC | PRN
  Administered 2019-02-27: 650 mg via ORAL
  Filled 2019-02-26: qty 2

## 2019-02-26 MED ORDER — METHOCARBAMOL 500 MG PO TABS
500.0000 mg | ORAL_TABLET | Freq: Four times a day (QID) | ORAL | Status: DC | PRN
  Administered 2019-02-26 – 2019-02-28 (×5): 500 mg via ORAL
  Filled 2019-02-26 (×4): qty 1

## 2019-02-26 MED ORDER — PROPOFOL 10 MG/ML IV BOLUS
INTRAVENOUS | Status: AC
  Filled 2019-02-26: qty 20

## 2019-02-26 MED ORDER — HYDRALAZINE HCL 20 MG/ML IJ SOLN
INTRAMUSCULAR | Status: AC
  Filled 2019-02-26: qty 1

## 2019-02-26 MED ORDER — FENTANYL CITRATE (PF) 100 MCG/2ML IJ SOLN
25.0000 ug | INTRAMUSCULAR | Status: DC | PRN
  Administered 2019-02-26 (×3): 50 ug via INTRAVENOUS

## 2019-02-26 MED ORDER — LACTATED RINGERS IV SOLN
INTRAVENOUS | Status: DC
  Administered 2019-02-26: 12:00:00 via INTRAVENOUS

## 2019-02-26 MED ORDER — ROPIVACAINE HCL 5 MG/ML IJ SOLN
INTRAMUSCULAR | Status: DC | PRN
  Administered 2019-02-26: 20 mL via PERINEURAL

## 2019-02-26 MED ORDER — HYDRALAZINE HCL 20 MG/ML IJ SOLN
10.0000 mg | Freq: Once | INTRAMUSCULAR | Status: AC
  Administered 2019-02-26: 10 mg via INTRAVENOUS

## 2019-02-26 MED ORDER — EPHEDRINE 5 MG/ML INJ
INTRAVENOUS | Status: AC
  Filled 2019-02-26: qty 10

## 2019-02-26 MED ORDER — BUPIVACAINE HCL (PF) 0.25 % IJ SOLN
INTRAMUSCULAR | Status: DC | PRN
  Administered 2019-02-26: 30 mL

## 2019-02-26 MED ORDER — METOCLOPRAMIDE HCL 5 MG/ML IJ SOLN: 5.0000 mg | Freq: Three times a day (TID) | INTRAMUSCULAR | Status: DC | PRN

## 2019-02-26 MED ORDER — LABETALOL HCL 5 MG/ML IV SOLN
INTRAVENOUS | Status: AC
  Filled 2019-02-26: qty 4

## 2019-02-26 MED ORDER — ASPIRIN EC 325 MG PO TBEC
325.0000 mg | DELAYED_RELEASE_TABLET | Freq: Every day | ORAL | Status: DC
  Administered 2019-02-27 – 2019-02-28 (×2): 325 mg via ORAL
  Filled 2019-02-26 (×2): qty 1

## 2019-02-26 MED ORDER — METOCLOPRAMIDE HCL 5 MG PO TABS: 5.0000 mg | ORAL_TABLET | Freq: Three times a day (TID) | ORAL | Status: DC | PRN

## 2019-02-26 MED ORDER — PROPOFOL 10 MG/ML IV BOLUS
INTRAVENOUS | Status: DC | PRN
  Administered 2019-02-26: 20 mg via INTRAVENOUS

## 2019-02-26 MED ORDER — HYDROMORPHONE HCL 1 MG/ML IJ SOLN
0.5000 mg | INTRAMUSCULAR | Status: DC | PRN
  Administered 2019-02-26 – 2019-02-27 (×3): 0.5 mg via INTRAVENOUS
  Filled 2019-02-26 (×3): qty 1

## 2019-02-26 MED ORDER — NIACIN 500 MG PO TABS
500.0000 mg | ORAL_TABLET | Freq: Every day | ORAL | Status: DC
  Administered 2019-02-26 – 2019-02-28 (×3): 500 mg via ORAL
  Filled 2019-02-26 (×3): qty 1

## 2019-02-26 MED ORDER — SERTRALINE HCL 100 MG PO TABS
100.0000 mg | ORAL_TABLET | Freq: Every day | ORAL | Status: DC
  Administered 2019-02-26 – 2019-02-27 (×2): 100 mg via ORAL
  Filled 2019-02-26 (×2): qty 1

## 2019-02-26 MED ORDER — ONDANSETRON HCL 4 MG/2ML IJ SOLN
INTRAMUSCULAR | Status: AC
  Filled 2019-02-26: qty 2

## 2019-02-26 MED ORDER — BUPIVACAINE HCL (PF) 0.25 % IJ SOLN
INTRAMUSCULAR | Status: AC
  Filled 2019-02-26: qty 30

## 2019-02-26 MED ORDER — TRANEXAMIC ACID 1000 MG/10ML IV SOLN
2000.0000 mg | Freq: Once | INTRAVENOUS | Status: DC
  Filled 2019-02-26: qty 20

## 2019-02-26 MED ORDER — LIDOCAINE 2% (20 MG/ML) 5 ML SYRINGE
INTRAMUSCULAR | Status: AC
  Filled 2019-02-26: qty 5

## 2019-02-26 MED ORDER — BUPIVACAINE IN DEXTROSE 0.75-8.25 % IT SOLN
INTRATHECAL | Status: DC | PRN
  Administered 2019-02-26: 1.4 mL via INTRATHECAL

## 2019-02-26 MED ORDER — SUCCINYLCHOLINE CHLORIDE 200 MG/10ML IV SOSY
PREFILLED_SYRINGE | INTRAVENOUS | Status: AC
  Filled 2019-02-26: qty 10

## 2019-02-26 MED ORDER — CEFAZOLIN SODIUM-DEXTROSE 2-4 GM/100ML-% IV SOLN
INTRAVENOUS | Status: AC
  Filled 2019-02-26: qty 100

## 2019-02-26 MED ORDER — MIDAZOLAM HCL 2 MG/2ML IJ SOLN
INTRAMUSCULAR | Status: AC
  Administered 2019-02-26: 2 mg via INTRAVENOUS
  Filled 2019-02-26: qty 2

## 2019-02-26 MED ORDER — PROPOFOL 500 MG/50ML IV EMUL
INTRAVENOUS | Status: DC | PRN
  Administered 2019-02-26: 14:00:00 via INTRAVENOUS
  Administered 2019-02-26: 75 ug/kg/min via INTRAVENOUS

## 2019-02-26 MED ORDER — LABETALOL HCL 5 MG/ML IV SOLN
5.0000 mg | Freq: Once | INTRAVENOUS | Status: AC
  Administered 2019-02-26: 5 mg via INTRAVENOUS

## 2019-02-26 MED ORDER — METHOCARBAMOL 1000 MG/10ML IJ SOLN: 500.0000 mg | Freq: Four times a day (QID) | INTRAVENOUS | Status: DC | PRN

## 2019-02-26 MED ORDER — CEFAZOLIN SODIUM-DEXTROSE 2-4 GM/100ML-% IV SOLN
2.0000 g | INTRAVENOUS | Status: AC
  Administered 2019-02-26: 2 g via INTRAVENOUS

## 2019-02-26 MED ORDER — MIDAZOLAM HCL 2 MG/2ML IJ SOLN
INTRAMUSCULAR | Status: AC
  Filled 2019-02-26: qty 2

## 2019-02-26 MED ORDER — ONDANSETRON HCL 4 MG PO TABS: 4.0000 mg | ORAL_TABLET | Freq: Four times a day (QID) | ORAL | Status: DC | PRN

## 2019-02-26 MED ORDER — MIDAZOLAM HCL 2 MG/2ML IJ SOLN
2.0000 mg | Freq: Once | INTRAMUSCULAR | Status: AC
  Administered 2019-02-26: 12:00:00 2 mg via INTRAVENOUS

## 2019-02-26 MED ORDER — FENTANYL CITRATE (PF) 250 MCG/5ML IJ SOLN
INTRAMUSCULAR | Status: AC
  Filled 2019-02-26: qty 5

## 2019-02-26 MED ORDER — MENTHOL 3 MG MT LOZG: 1.0000 | LOZENGE | OROMUCOSAL | Status: DC | PRN

## 2019-02-26 MED ORDER — SODIUM CHLORIDE 0.9 % IV SOLN: INTRAVENOUS | Status: DC

## 2019-02-26 MED ORDER — CHLORHEXIDINE GLUCONATE 4 % EX LIQD: 60.0000 mL | Freq: Once | CUTANEOUS | Status: DC

## 2019-02-26 MED ORDER — POLYETHYLENE GLYCOL 3350 17 G PO PACK: 17.0000 g | PACK | Freq: Every day | ORAL | Status: DC | PRN

## 2019-02-26 MED ORDER — OXYCODONE HCL 5 MG PO TABS
5.0000 mg | ORAL_TABLET | ORAL | Status: DC | PRN
  Administered 2019-02-26 – 2019-02-28 (×11): 10 mg via ORAL
  Filled 2019-02-26 (×10): qty 2

## 2019-02-26 MED ORDER — PHENYLEPHRINE 40 MCG/ML (10ML) SYRINGE FOR IV PUSH (FOR BLOOD PRESSURE SUPPORT)
PREFILLED_SYRINGE | INTRAVENOUS | Status: AC
  Filled 2019-02-26: qty 10

## 2019-02-26 MED ORDER — DOCUSATE SODIUM 100 MG PO CAPS
100.0000 mg | ORAL_CAPSULE | Freq: Two times a day (BID) | ORAL | Status: DC
  Administered 2019-02-26 – 2019-02-28 (×4): 100 mg via ORAL
  Filled 2019-02-26 (×4): qty 1

## 2019-02-26 MED ORDER — CEFAZOLIN SODIUM-DEXTROSE 1-4 GM/50ML-% IV SOLN
1.0000 g | Freq: Three times a day (TID) | INTRAVENOUS | Status: AC
  Administered 2019-02-26 – 2019-02-27 (×2): 1 g via INTRAVENOUS
  Filled 2019-02-26 (×2): qty 50

## 2019-02-26 MED ORDER — METHOCARBAMOL 500 MG PO TABS
ORAL_TABLET | ORAL | Status: AC
  Administered 2019-02-26: 500 mg via ORAL
  Filled 2019-02-26: qty 1

## 2019-02-26 MED ORDER — PHENOL 1.4 % MT LIQD: 1.0000 | OROMUCOSAL | Status: DC | PRN

## 2019-02-26 MED ORDER — SODIUM CHLORIDE 0.9 % IR SOLN
Status: DC | PRN
  Administered 2019-02-26: 1

## 2019-02-26 MED ORDER — DEXAMETHASONE SODIUM PHOSPHATE 4 MG/ML IJ SOLN
INTRAMUSCULAR | Status: DC | PRN
  Administered 2019-02-26: 4 mg via PERINEURAL

## 2019-02-26 MED ORDER — OXYCODONE HCL 5 MG PO TABS
ORAL_TABLET | ORAL | Status: AC
  Administered 2019-02-27: 10 mg via ORAL
  Filled 2019-02-26: qty 2

## 2019-02-26 MED ORDER — DEXAMETHASONE SODIUM PHOSPHATE 10 MG/ML IJ SOLN
INTRAMUSCULAR | Status: AC
  Filled 2019-02-26: qty 2

## 2019-02-26 MED ORDER — ONDANSETRON HCL 4 MG/2ML IJ SOLN: 4.0000 mg | Freq: Four times a day (QID) | INTRAMUSCULAR | Status: DC | PRN

## 2019-02-26 MED ORDER — DIAZEPAM 5 MG PO TABS: 5.0000 mg | ORAL_TABLET | Freq: Two times a day (BID) | ORAL | Status: DC | PRN

## 2019-02-26 MED ORDER — ACETAMINOPHEN 500 MG PO TABS
1000.0000 mg | ORAL_TABLET | Freq: Once | ORAL | Status: AC
  Administered 2019-02-26: 1000 mg via ORAL
  Filled 2019-02-26: qty 2

## 2019-02-26 SURGICAL SUPPLY — 73 items
ATTUNE PS FEM LT SZ 6 CEM KNEE (Femur) ×2 IMPLANT
ATTUNE PSRP INSR SZ6 5 KNEE (Insert) ×2 IMPLANT
BANDAGE ESMARK 6X9 LF (GAUZE/BANDAGES/DRESSINGS) ×1 IMPLANT
BASE TIBIA ATTUNE KNEE SYS SZ6 (Knees) ×1 IMPLANT
BENZOIN TINCTURE PRP APPL 2/3 (GAUZE/BANDAGES/DRESSINGS) ×2 IMPLANT
BLADE SAGITTAL 25.0X1.19X90 (BLADE) ×2 IMPLANT
BLADE SAW SGTL 13X75X1.27 (BLADE) ×2 IMPLANT
BNDG ELASTIC 4X5.8 VLCR STR LF (GAUZE/BANDAGES/DRESSINGS) ×2 IMPLANT
BNDG ELASTIC 6X10 VLCR STRL LF (GAUZE/BANDAGES/DRESSINGS) ×2 IMPLANT
BNDG ESMARK 6X9 LF (GAUZE/BANDAGES/DRESSINGS) ×2
BOWL SMART MIX CTS (DISPOSABLE) ×2 IMPLANT
CEMENT HV SMART SET (Cement) ×4 IMPLANT
COVER SURGICAL LIGHT HANDLE (MISCELLANEOUS) ×2 IMPLANT
COVER WAND RF STERILE (DRAPES) ×2 IMPLANT
CUFF TOURN SGL QUICK 34 (TOURNIQUET CUFF) ×1
CUFF TOURN SGL QUICK 42 (TOURNIQUET CUFF) IMPLANT
CUFF TRNQT CYL 34X4.125X (TOURNIQUET CUFF) ×1 IMPLANT
DRAPE ORTHO SPLIT 77X108 STRL (DRAPES) ×2
DRAPE SURG ORHT 6 SPLT 77X108 (DRAPES) ×2 IMPLANT
DRAPE U-SHAPE 47X51 STRL (DRAPES) ×2 IMPLANT
DRSG ADAPTIC 3X8 NADH LF (GAUZE/BANDAGES/DRESSINGS) ×2 IMPLANT
DRSG PAD ABDOMINAL 8X10 ST (GAUZE/BANDAGES/DRESSINGS) ×2 IMPLANT
DURAPREP 26ML APPLICATOR (WOUND CARE) ×4 IMPLANT
ELECT REM PT RETURN 9FT ADLT (ELECTROSURGICAL) ×2
ELECTRODE REM PT RTRN 9FT ADLT (ELECTROSURGICAL) ×1 IMPLANT
EVACUATOR 1/8 PVC DRAIN (DRAIN) IMPLANT
FACESHIELD WRAPAROUND (MASK) ×4 IMPLANT
GAUZE SPONGE 4X4 12PLY STRL (GAUZE/BANDAGES/DRESSINGS) ×2 IMPLANT
GAUZE XEROFORM 5X9 LF (GAUZE/BANDAGES/DRESSINGS) ×2 IMPLANT
GLOVE BIOGEL PI IND STRL 8 (GLOVE) ×2 IMPLANT
GLOVE BIOGEL PI INDICATOR 8 (GLOVE) ×2
GLOVE ORTHO TXT STRL SZ7.5 (GLOVE) ×4 IMPLANT
GOWN STRL REUS W/ TWL LRG LVL3 (GOWN DISPOSABLE) ×1 IMPLANT
GOWN STRL REUS W/ TWL XL LVL3 (GOWN DISPOSABLE) ×1 IMPLANT
GOWN STRL REUS W/TWL 2XL LVL3 (GOWN DISPOSABLE) ×2 IMPLANT
GOWN STRL REUS W/TWL LRG LVL3 (GOWN DISPOSABLE) ×1
GOWN STRL REUS W/TWL XL LVL3 (GOWN DISPOSABLE) ×1
HANDPIECE INTERPULSE COAX TIP (DISPOSABLE) ×1
IMMOBILIZER KNEE 22 UNIV (SOFTGOODS) ×2 IMPLANT
KIT BASIN OR (CUSTOM PROCEDURE TRAY) ×2 IMPLANT
KIT TURNOVER KIT B (KITS) ×2 IMPLANT
MANIFOLD NEPTUNE II (INSTRUMENTS) ×2 IMPLANT
MARKER SKIN DUAL TIP RULER LAB (MISCELLANEOUS) ×2 IMPLANT
NEEDLE 18GX1X1/2 (RX/OR ONLY) (NEEDLE) ×2 IMPLANT
NEEDLE HYPO 25GX1X1/2 BEV (NEEDLE) ×2 IMPLANT
NS IRRIG 1000ML POUR BTL (IV SOLUTION) ×2 IMPLANT
PACK TOTAL JOINT (CUSTOM PROCEDURE TRAY) ×2 IMPLANT
PAD ABD 8X10 STRL (GAUZE/BANDAGES/DRESSINGS) ×2 IMPLANT
PAD ARMBOARD 7.5X6 YLW CONV (MISCELLANEOUS) ×4 IMPLANT
PAD CAST 4YDX4 CTTN HI CHSV (CAST SUPPLIES) ×1 IMPLANT
PADDING CAST COTTON 4X4 STRL (CAST SUPPLIES) ×1
PADDING CAST COTTON 6X4 STRL (CAST SUPPLIES) ×2 IMPLANT
PATELLA MEDIAL ATTUN 35MM KNEE (Knees) ×2 IMPLANT
PIN DRILL FIX HALF THREAD (BIT) ×2 IMPLANT
PIN STEINMAN FIXATION KNEE (PIN) ×2 IMPLANT
SET HNDPC FAN SPRY TIP SCT (DISPOSABLE) ×1 IMPLANT
STAPLER VISISTAT 35W (STAPLE) IMPLANT
STRIP CLOSURE SKIN 1/2X4 (GAUZE/BANDAGES/DRESSINGS) ×4 IMPLANT
SUCTION FRAZIER HANDLE 10FR (MISCELLANEOUS) ×1
SUCTION TUBE FRAZIER 10FR DISP (MISCELLANEOUS) ×1 IMPLANT
SUT VIC AB 0 CT1 27 (SUTURE) ×1
SUT VIC AB 0 CT1 27XBRD ANBCTR (SUTURE) ×1 IMPLANT
SUT VIC AB 1 CTX 36 (SUTURE) ×2
SUT VIC AB 1 CTX36XBRD ANBCTR (SUTURE) ×2 IMPLANT
SUT VIC AB 2-0 CT1 27 (SUTURE) ×2
SUT VIC AB 2-0 CT1 TAPERPNT 27 (SUTURE) ×2 IMPLANT
SUT VIC AB 3-0 X1 27 (SUTURE) ×2 IMPLANT
SYR 50ML LL SCALE MARK (SYRINGE) ×2 IMPLANT
SYR CONTROL 10ML LL (SYRINGE) ×2 IMPLANT
TIBIA ATTUNE KNEE SYS BASE SZ6 (Knees) ×2 IMPLANT
TOWEL GREEN STERILE (TOWEL DISPOSABLE) ×2 IMPLANT
TOWEL GREEN STERILE FF (TOWEL DISPOSABLE) ×2 IMPLANT
TRAY CATH 16FR W/PLASTIC CATH (SET/KITS/TRAYS/PACK) IMPLANT

## 2019-02-26 NOTE — Plan of Care (Signed)
  Problem: Activity: Goal: Risk for activity intolerance will decrease Outcome: Progressing   Problem: Nutrition: Goal: Adequate nutrition will be maintained Outcome: Progressing   Problem: Pain Managment: Goal: General experience of comfort will improve Outcome: Progressing   Problem: Safety: Goal: Ability to remain free from injury will improve Outcome: Progressing   

## 2019-02-26 NOTE — Progress Notes (Signed)
Orthopedic Tech Progress Note Patient Details:  Emily Bender 1965/08/21 [id number]  CPM Left Knee CPM Left Knee: On Left Knee Flexion (Degrees): 90 Left Knee Extension (Degrees): 0 Additional Comments: Trapeze bar and foot roll     [place] Pink 02/26/2019, 3:45 PM

## 2019-02-26 NOTE — Progress Notes (Signed)
Pt checked into 5N. All patient questions and concerns answered and nursing orders followed. Pt oriented to the unit and plan of care. Will continue to monitor.

## 2019-02-26 NOTE — Anesthesia Procedure Notes (Signed)
Spinal  Patient location during procedure: OR Start time: 02/26/2019 12:35 PM End time: 02/26/2019 12:45 PM Staffing Anesthesiologist: Freddrick March, MD Performed: anesthesiologist  Preanesthetic Checklist Completed: patient identified, surgical consent, pre-op evaluation, timeout performed, IV checked, risks and benefits discussed and monitors and equipment checked Spinal Block Patient position: sitting Prep: site prepped and draped and DuraPrep Patient monitoring: cardiac monitor, continuous pulse ox and blood pressure Approach: midline Location: L3-4 Injection technique: single-shot Needle Needle type: Pencan  Needle gauge: 24 G Needle length: 9 cm Assessment Sensory level: T6 Additional Notes Functioning IV was confirmed and monitors were applied. Sterile prep and drape, including hand hygiene and sterile gloves were used. The patient was positioned and the spine was prepped. The skin was anesthetized with lidocaine.  Free flow of clear CSF was obtained prior to injecting local anesthetic into the CSF.  The spinal needle aspirated freely following injection.  The needle was carefully withdrawn.  The patient tolerated the procedure well.

## 2019-02-26 NOTE — Transfer of Care (Signed)
Immediate Anesthesia Transfer of Care Note  Patient: Emily Bender  Procedure(s) Performed: LEFT TOTAL KNEE ARTHROPLASTY-CEMENTED (Left Knee)  Patient Location: PACU  Anesthesia Type:MAC combined with regional for post-op pain  Level of Consciousness: awake, alert , oriented and patient cooperative  Airway & Oxygen Therapy: Patient Spontanous Breathing and Patient connected to nasal cannula oxygen  Post-op Assessment: Report given to RN and Post -op Vital signs reviewed and stable  Post vital signs: Reviewed and stable  Last Vitals:  Vitals Value Taken Time  BP    Temp    Pulse 76 02/26/19 1440  Resp 21 02/26/19 1440  SpO2 93 % 02/26/19 1440  Vitals shown include unvalidated device data.  Last Pain:  Vitals:   02/26/19 1220  TempSrc:   PainSc: 0-No pain         Complications: No apparent anesthesia complications

## 2019-02-26 NOTE — Anesthesia Procedure Notes (Signed)
Anesthesia Regional Block: Adductor canal block   Pre-Anesthetic Checklist: ,, timeout performed, Correct Patient, Correct Site, Correct Laterality, Correct Procedure, Correct Position, site marked, Risks and benefits discussed,  Surgical consent,  Pre-op evaluation,  At surgeon's request and post-op pain management  Laterality: Left  Prep: Maximum Sterile Barrier Precautions used, chloraprep       Needles:  Injection technique: Single-shot  Needle Type: Echogenic Stimulator Needle     Needle Length: 9cm  Needle Gauge: 22     Additional Needles:   Procedures:,,,, ultrasound used (permanent image in chart),,,,  Narrative:  Start time: 02/26/2019 12:01 PM End time: 02/26/2019 12:11 PM Injection made incrementally with aspirations every 5 mL.  Performed by: Personally  Anesthesiologist: Freddrick March, MD  Additional Notes: Monitors applied. No increased pain on injection. No increased resistance to injection. Injection made in 5cc increments. Good needle visualization. Patient tolerated procedure well.

## 2019-02-26 NOTE — Op Note (Signed)
Preop diagnosis: Left knee primary osteoarthritis with varus deformity  Postop diagnosis: Same  Procedure: Left cemented total knee arthroplasty  Surgeon: Rodell Perna, MD  Assistant: Benjiman Core, PA-C medically necessary and present for the entire procedure  Anesthesia spinal plus Marcaine and Exparel injected at the end of the case and preoperative block.  Tourniquet: 1 hour x 350 mmHg  Implants:Depuy attune #6 femur #6 tibia 5 mm rotating platform spacer.  35 mm 3 peg patella.  Procedure: After induction of general anesthesia proximal thigh tourniquet lateral post heel bump extremity sheets and drapes were applied after DuraPrep down to the toes with preoperative Ancef timeout procedure sterile skin marker impervious stockinette Coban and Betadine Steri-Drape sealing the skin.  Leg was wrapped in Esmarch tourniquet inflated midline incision was made.  Medial parapatellar incision was made.  Tricompartmental degenerative changes with bone-on-bone polished femoral condyle and tibial plateau with large marginal osteophytes.  Patient had varus of greater than 10 degrees spurs removed medially.  Patellofemoral spurs were noted.  Patella was cut removing 10 mm.  Intramedullary hole drilled in the femur and distal femoral cut was made initially taking 9 off and additional 2 mm had to be resected to take adequate medial cut due to the medial femoral condyle wear.  Resection of the tibia was in line with the inferior aspect of the osteophyte medially slightly more bone taken laterally as expected due to the varus deformity than medial on the tibia.  5 mm spacer allowed complete straightening of the knee in varus and valgus position with correction of varus and full extension.  Chamfer cuts were made on the femur box cuts sizing and preparation on the tibia was a #6 also.  Tibia was cut at 3 degrees keel preparation.  Large posterior spurs removed off the posterior aspect medial femoral condyle and a large  loose body not visualized on x-ray was removed that was 1 x 1 cm.  All meniscus were resected pulse lavage vacuum mixing of the cement cementing of the tibia followed by femur placement of the permanent 5 mm rotating bearing and then 35 mm 3 peg patella.  Cement was hard at 15 minutes tourniquet deflated.  While the cement was hardening Exparel and Marcaine was injected for postoperative analgesia.  TXA soaked sponge was placed in the wound.  Standard deep layer closure ~subtendinous tissue skin staple closure postop dressing and knee immobilizer.  Patient tolerated procedure well transferred recovery in stable condition.

## 2019-02-26 NOTE — H&P (Signed)
TOTAL KNEE ADMISSION H&P  Patient is being admitted for left total knee arthroplasty.  Subjective:  Chief Complaint:left knee pain.  HPI: Emily Bender, 53 y.o. female, has a history of pain and functional disability in the left knee due to arthritis and has failed non-surgical conservative treatments for greater than 12 weeks to includeNSAID's and/or analgesics, corticosteriod injections, flexibility and strengthening excercises and use of assistive devices.  Onset of symptoms was gradual, starting 3 years ago with gradually worsening course since that time. The patient noted no past surgery on the left knee(s).  Patient currently rates pain in the left knee(s) at 6 out of 10 with activity. Patient has night pain, worsening of pain with activity and weight bearing, pain that interferes with activities of daily living, pain with passive range of motion and joint swelling.  Patient has evidence of subchondral cysts, periarticular osteophytes, joint subluxation and joint space narrowing by imaging studies. This patient has had no previous left knee surgery. There is no active infection.  Patient Active Problem List   Diagnosis Date Noted  . Unilateral primary osteoarthritis, left knee 01/08/2019  . Wound infection 11/18/2018  . Lumbar stenosis 10/30/2018  . Herniated nucleus pulposus, lumbar 10/30/2018  . Spinal stenosis of lumbar region 10/16/2018  . Lumbar herniated disc 10/14/2018  . Alcohol drinker 05/20/2015  . Anxiety and depression 05/20/2015  . History of artificial joint 05/20/2015  . HLD (hyperlipidemia) 05/20/2015  . Avitaminosis D 05/20/2015  . Chronic cough 01/20/2014  . Adiposity 01/18/2012  . Irregular bleeding 11/27/2006  . Tobacco use 06/23/2005   Past Medical History:  Diagnosis Date  . Anxiety   . Arthritis   . Depression   . Hyperlipidemia   . Lumbar stenosis   . Sleep apnea    Waiting for Cpap    Past Surgical History:  Procedure Laterality Date  . ANKLE  SURGERY  01/17/2012   x3  . APPLICATION OF WOUND VAC N/A 11/18/2018   Procedure: Application Of Wound Vac;  Surgeon: Marybelle Killings, MD;  Location: [facility];  Service: Orthopedics;  Laterality: N/A;  . BACK SURGERY    . BREAST BIOPSY Left 1990's   neg  . CHOLECYSTECTOMY  2000  . COLONOSCOPY WITH PROPOFOL N/A 01/31/2019   Procedure: COLONOSCOPY WITH PROPOFOL;  Surgeon: Jonathon Bellows, MD;  Location: [facility] ENDOSCOPY;  Service: Gastroenterology;  Laterality: N/A;  . I&D EXTREMITY N/A 11/18/2018   Procedure: EVACUATION OF LUMBAR SEROMA/HEMATOMA;  Surgeon: Marybelle Killings, MD;  Location: [facility];  Service: Orthopedics;  Laterality: N/A;  . LAMINECTOMY  1996  . LUMBAR LAMINECTOMY N/A 10/30/2018   Procedure: left L3 & L4 hemilaminectomy, removal of herniated nucleus pulposus;  Surgeon: Marybelle Killings, MD;  Location: [facility];  Service: Orthopedics;  Laterality: N/A;  . WRIST SURGERY Right     Current Facility-Administered Medications  Medication Dose Route Frequency Provider Last Rate Last Dose  . bupivacaine liposome (EXPAREL) 1.3 % injection 266 mg  20 mL Infiltration Once Marybelle Killings, MD      . ceFAZolin (ANCEF) 2-4 GM/100ML-% IVPB           . ceFAZolin (ANCEF) IVPB 2g/100 mL premix  2 g Intravenous On Call to OR Lanae Crumbly, PA-C      . chlorhexidine (HIBICLENS) 4 % liquid 4 application  60 mL Topical Once Benjiman Core M, PA-C      . lactated ringers infusion   Intravenous Continuous Woodrum, Chelsey L, MD 10 mL/hr at  02/26/19 1135     Allergies  Allergen Reactions  . Sulfa Antibiotics Hives    Social History   Tobacco Use  . Smoking status: Current Every Day Smoker    Packs/day: 0.25    Years: 20.00    Pack years: 5.00    Types: Cigarettes  . Smokeless tobacco: Never Used  Substance Use Topics  . Alcohol use: Yes    Alcohol/week: 6.0 standard drinks    Types: 6 Cans of beer per week    Comment: none last hrs    Family History  Problem Relation Age of Onset  . Lung cancer Father   .  Healthy Brother   . Aneurysm Maternal Grandmother   . Breast cancer Maternal Grandmother        mat great gm  . Lung cancer Maternal Grandfather   . Coronary artery disease Paternal Grandmother   . Breast cancer Paternal Grandmother   . Diabetes Mother   . Hypertension Mother      ROS  Objective:  Physical Exam  Vital signs in last 24 hours: Temp:  [97.8 F (36.6 C)] 97.8 F (36.6 C) (11/11 1048) Pulse Rate:  [73-80] 73 (11/11 1220) Resp:  [13-19] 13 (11/11 1220) BP: (117-140)/(73-86) 139/86 (11/11 1220) SpO2:  [94 %-100 %] 97 % (11/11 1220) Weight:  [108.5 kg] 108.5 kg (11/11 1130)  Labs:   Estimated body mass index is 37.46 kg/m as calculated from the following:   Height as of this encounter: 5\' 7"  (1.702 m).   Weight as of this encounter: 108.5 kg.   Imaging Review Plain radiographs demonstrate severe degenerative joint disease of the left knee(s). The overall alignment issignificant varus. The bone quality appears to be good for age and reported activity level.      Assessment/Plan:  End stage arthritis, left knee   The patient history, physical examination, clinical judgment of the provider and imaging studies are consistent with end stage degenerative joint disease of the left knee(s) and total knee arthroplasty is deemed medically necessary. The treatment options including medical management, injection therapy arthroscopy and arthroplasty were discussed at length. The risks and benefits of total knee arthroplasty were presented and reviewed. The risks due to aseptic loosening, infection, stiffness, patella tracking problems, thromboembolic complications and other imponderables were discussed. The patient acknowledged the explanation, agreed to proceed with the plan and consent was signed. Patient is being admitted for inpatient treatment for surgery, pain control, PT, OT, prophylactic antibiotics, VTE prophylaxis, progressive ambulation and ADL's and discharge  planning. The patient is planning to be discharged home with home health services    Will be admit or observ whatever insurance approved.

## 2019-02-26 NOTE — Interval H&P Note (Signed)
History and Physical Interval Note:  02/26/2019 12:28 PM  Emily Bender  has presented today for surgery, with the diagnosis of left knee osteoarthritis.  The various methods of treatment have been discussed with the patient and family. After consideration of risks, benefits and other options for treatment, the patient has consented to  Procedure(s): LEFT TOTAL KNEE ARTHROPLASTY-CEMENTED (Left) as a surgical intervention.  The patient's history has been reviewed, patient examined, no change in status, stable for surgery.  I have reviewed the patient's chart and labs.  Questions were answered to the patient's satisfaction.     Marybelle Killings

## 2019-02-27 ENCOUNTER — Encounter (HOSPITAL_COMMUNITY): Payer: Self-pay | Admitting: Orthopaedic Surgery

## 2019-02-27 DIAGNOSIS — F17210 Nicotine dependence, cigarettes, uncomplicated: Secondary | ICD-10-CM | POA: Diagnosis not present

## 2019-02-27 DIAGNOSIS — Z79899 Other long term (current) drug therapy: Secondary | ICD-10-CM | POA: Diagnosis not present

## 2019-02-27 DIAGNOSIS — M1712 Unilateral primary osteoarthritis, left knee: Secondary | ICD-10-CM | POA: Diagnosis not present

## 2019-02-27 DIAGNOSIS — E785 Hyperlipidemia, unspecified: Secondary | ICD-10-CM | POA: Diagnosis not present

## 2019-02-27 DIAGNOSIS — F419 Anxiety disorder, unspecified: Secondary | ICD-10-CM | POA: Diagnosis not present

## 2019-02-27 DIAGNOSIS — F329 Major depressive disorder, single episode, unspecified: Secondary | ICD-10-CM | POA: Diagnosis not present

## 2019-02-27 DIAGNOSIS — Z882 Allergy status to sulfonamides status: Secondary | ICD-10-CM | POA: Diagnosis not present

## 2019-02-27 DIAGNOSIS — G4730 Sleep apnea, unspecified: Secondary | ICD-10-CM | POA: Diagnosis not present

## 2019-02-27 LAB — CBC
HCT: 38.5 % (ref 36.0–46.0)
Hemoglobin: 12.6 g/dL (ref 12.0–15.0)
MCH: 30.6 pg (ref 26.0–34.0)
MCHC: 32.7 g/dL (ref 30.0–36.0)
MCV: 93.4 fL (ref 80.0–100.0)
Platelets: 249 10*3/uL (ref 150–400)
RBC: 4.12 MIL/uL (ref 3.87–5.11)
RDW: 13.1 % (ref 11.5–15.5)
WBC: 15.1 10*3/uL — ABNORMAL HIGH (ref 4.0–10.5)
nRBC: 0 % (ref 0.0–0.2)

## 2019-02-27 LAB — BASIC METABOLIC PANEL
Anion gap: 11 (ref 5–15)
BUN: 12 mg/dL (ref 6–20)
CO2: 22 mmol/L (ref 22–32)
Calcium: 8.8 mg/dL — ABNORMAL LOW (ref 8.9–10.3)
Chloride: 102 mmol/L (ref 98–111)
Creatinine, Ser: 0.58 mg/dL (ref 0.44–1.00)
GFR calc Af Amer: >60 mL/min (ref >60)
GFR calc non Af Amer: >60 mL/min (ref >60)
Glucose, Bld: 136 mg/dL — ABNORMAL HIGH (ref 70–99)
Potassium: 4.1 mmol/L (ref 3.5–5.1)
Sodium: 135 mmol/L (ref 135–145)

## 2019-02-27 MED ORDER — HYDROCHLOROTHIAZIDE 25 MG PO TABS
25.0000 mg | ORAL_TABLET | Freq: Every day | ORAL | Status: DC
  Administered 2019-02-27 – 2019-02-28 (×2): 25 mg via ORAL
  Filled 2019-02-27 (×2): qty 1

## 2019-02-27 NOTE — Care Management Obs Status (Signed)
[facility] NOTIFICATION   Patient Details  Name: NEILY GABLER MRN: [id number] Date of Birth: [birth year]   Medicare Observation Status Notification Given:  Yes    Atilano Median, LCSW 02/27/2019, 3:43 PM

## 2019-02-27 NOTE — Progress Notes (Signed)
Physical Therapy Treatment Patient Details Name: Emily Bender MRN: [id number] DOB: February 15, 1966 Today's Date: 02/27/2019    History of Present Illness Pt is a 53 y/o female s/p elective L TKA. PMH including but not limited to HLD and prior back surgeries.    PT Comments    Pt making steady progress with functional mobility. Will plan to progress to stair training at next session prior to d/c home. Pt performing transfers and ambulation with RW with supervision for safety. No instability or LOB, no need for physical assistance. Pt would continue to benefit from skilled physical therapy services at this time while admitted and after d/c to address the below listed limitations in order to improve overall safety and independence with functional mobility.    Follow Up Recommendations  Home health PT;Other (comment)(will likely quickly progress to OPPT)     Equipment Recommendations  Rolling walker with 5" wheels    Recommendations for Other Services       Precautions / Restrictions Precautions Precautions: Fall;Knee Restrictions Weight Bearing Restrictions: Yes Other Position/Activity Restrictions: WBAT L LE    Mobility  Bed Mobility Overal bed mobility: Needs Assistance Bed Mobility: Supine to Sit;Sit to Supine     Supine to sit: Supervision Sit to supine: Supervision      Transfers Overall transfer level: Needs assistance Equipment used: Rolling walker (2 wheeled) Transfers: Sit to/from Stand Sit to Stand: Supervision         General transfer comment: good technique utilized, supervision for safety  Ambulation/Gait Ambulation/Gait assistance: Supervision Gait Distance (Feet): 75 Feet Assistive device: Rolling walker (2 wheeled) Gait Pattern/deviations: Step-to pattern;Step-through pattern;Decreased step length - right;Decreased step length - left;Decreased stance time - left;Decreased stride length;Decreased weight shift to left;Antalgic Gait velocity:  decreased   General Gait Details: pt overall steady with RW, no LOB or need for physical assistance, min guard for safety; pt using a step-to gait pattern with decreased knee flexion during swing phase on L and unable to achieve terminal knee extension in stance phase on L; progressing to an emerging step-through pattern with cueing and demonstration   Stairs             Wheelchair Mobility    Modified Rankin (Stroke Patients Only)       Balance Overall balance assessment: Needs assistance Sitting-balance support: Feet supported Sitting balance-Leahy Scale: Good     Standing balance support: Bilateral upper extremity supported;Single extremity supported Standing balance-Leahy Scale: Poor                              Cognition Arousal/Alertness: Awake/alert Behavior During Therapy: WFL for tasks assessed/performed Overall Cognitive Status: Within Functional Limits for tasks assessed                                        Exercises Total Joint Exercises Ankle Circles/Pumps: AROM;Left;20 reps;Supine Quad Sets: AROM;Strengthening;Left;10 reps;Supine Gluteal Sets: AROM;Strengthening;Both;10 reps;Supine Heel Slides: AROM;Strengthening;Left;10 reps;Supine Hip ABduction/ADduction: AROM;Strengthening;Left;10 reps;Supine Straight Leg Raises: AROM;Strengthening;Left;10 reps;Supine    General Comments        Pertinent Vitals/Pain Pain Assessment: Faces Faces Pain Scale: Hurts even more Pain Location: L knee Pain Descriptors / Indicators: Sore Pain Intervention(s): Monitored during session;Repositioned;Patient requesting pain meds-RN notified    Home Living  Prior Function            PT Goals (current goals can now be found in the care plan section) Acute Rehab PT Goals PT Goal Formulation: With patient Time For Goal Achievement: 03/13/19 Potential to Achieve Goals: Good Progress towards PT goals:  Progressing toward goals    Frequency    7X/week      PT Plan Current plan remains appropriate    Co-evaluation              AM-PAC PT "6 Clicks" Mobility   Outcome Measure  Help needed turning from your back to your side while in a flat bed without using bedrails?: None Help needed moving from lying on your back to sitting on the side of a flat bed without using bedrails?: None Help needed moving to and from a bed to a chair (including a wheelchair)?: None Help needed standing up from a chair using your arms (e.g., wheelchair or bedside chair)?: None Help needed to walk in hospital room?: None Help needed climbing 3-5 steps with a railing? : A Little 6 Click Score: 23    End of Session   Activity Tolerance: Patient tolerated treatment well Patient left: in bed;with call bell/phone within reach;with nursing/sitter in room Nurse Communication: Mobility status;Patient requests pain meds PT Visit Diagnosis: Other abnormalities of gait and mobility (R26.89);Pain Pain - Right/Left: Left Pain - part of body: Knee     Time: [id number] PT Time Calculation (min) (ACUTE ONLY): 19 min  Charges:  $Gait Training: 8-22 mins                     Anastasio Champion, DPT  Acute Rehabilitation Services Pager [phone] Office [place] 02/27/2019, 3:09 PM

## 2019-02-27 NOTE — Progress Notes (Signed)
09:26 Notified Dr. Lorin Mercy of patient's elevated BP. Was told to order 25mg  HCTZ daily.

## 2019-02-27 NOTE — TOC Progression Note (Signed)
Transition of Care [facility]) - Progression Note    Patient Details  Name: Emily Bender MRN: [id number] Date of Birth: 05/01/1965  Transition of Care [facility]) CM/SW [place], LCSW Phone Number: 02/27/2019, 3:47 PM  Clinical Narrative:    CSW spoke with patient regarding needed DME for discharge. Patient agreeable. CSW will continue to follow for dispo planning.     Barriers to Discharge: Continued Medical Work up  Expected Discharge Plan and Services                    DME Arranged: Gilford Rile rolling DME Agency: [employer] Date DME Agency Contacted: 02/27/19 Time DME Agency Contacted: [phone] Representative spoke with at DME Agency: Zack       Social Determinants of Health ([name]) Interventions    Readmission Risk Interventions No flowsheet data found.

## 2019-02-27 NOTE — Anesthesia Postprocedure Evaluation (Signed)
Anesthesia Post Note  Patient: Emily Bender  Procedure(s) Performed: LEFT TOTAL KNEE ARTHROPLASTY-CEMENTED (Left Knee)     Patient location during evaluation: PACU Anesthesia Type: Regional and Spinal Level of consciousness: oriented and awake and alert Pain management: pain level controlled Vital Signs Assessment: post-procedure vital signs reviewed and stable Respiratory status: spontaneous breathing, respiratory function stable and patient connected to nasal cannula oxygen Cardiovascular status: blood pressure returned to baseline and stable Postop Assessment: no headache, no backache and no apparent nausea or vomiting Anesthetic complications: no    Last Vitals:  Vitals:   02/27/19 0348 02/27/19 0837  BP: (!) 147/91 (!) 171/96  Pulse: 71 85  Resp: 18 14  Temp: 36.8 C 37.1 C  SpO2: 98% 97%    Last Pain:  Vitals:   02/27/19 0837  TempSrc: Oral  PainSc:                  Tia Hieronymus L Lex Linhares

## 2019-02-27 NOTE — Evaluation (Signed)
Physical Therapy Evaluation Patient Details Name: Emily Bender MRN: [id number] DOB: October 16, 1965 Today's Date: 02/27/2019   History of Present Illness  Pt is a 53 y/o female s/p elective L TKA. PMH including but not limited to HLD and prior back surgeries.  Clinical Impression  Pt presented supine in bed with HOB elevated, awake and willing to participate in therapy session. Prior to admission, pt reported that she was independent with all functional mobility and ADLs. Pt lives with her mother and adult daughter in a single level home with a couple of steps to enter. At the time of evaluation, pt at supervision to min guard level overall with use of RW to ambulate. Pt will likely progress very well with mobility. She is highly motivated and eager to work with therapy. Pt would continue to benefit from skilled physical therapy services at this time while admitted and after d/c to address the below listed limitations in order to improve overall safety and independence with functional mobility.     Follow Up Recommendations Home health PT;Other (comment)(likely quickly progress to OPPT)    Equipment Recommendations  Rolling walker with 5" wheels    Recommendations for Other Services       Precautions / Restrictions Precautions Precautions: Fall;Knee Precaution Comments: reviewed positioning of LE following knee surgery with pt Restrictions Weight Bearing Restrictions: Yes Other Position/Activity Restrictions: WBAT L LE      Mobility  Bed Mobility Overal bed mobility: Needs Assistance Bed Mobility: Supine to Sit     Supine to sit: Supervision        Transfers Overall transfer level: Needs assistance Equipment used: Rolling walker (2 wheeled) Transfers: Sit to/from Stand Sit to Stand: Min guard         General transfer comment: cueing for safe hand placement and technique, min guard with transitional movement into standing from EOB  Ambulation/Gait Ambulation/Gait  assistance: Min guard Gait Distance (Feet): 50 Feet Assistive device: Rolling walker (2 wheeled) Gait Pattern/deviations: Step-to pattern;Decreased step length - right;Decreased step length - left;Decreased stance time - left;Decreased stride length;Decreased weight shift to left Gait velocity: decreased   General Gait Details: pt overall steady with RW, no LOB or need for physical assistance, min guard for safety; pt using a step-to gait pattern with decreased knee flexion during swing phase on L and unable to achieve terminal knee extension in stance phase on L  Stairs            Wheelchair Mobility    Modified Rankin (Stroke Patients Only)       Balance Overall balance assessment: Needs assistance Sitting-balance support: Feet supported Sitting balance-Leahy Scale: Good     Standing balance support: Bilateral upper extremity supported;Single extremity supported Standing balance-Leahy Scale: Poor                               Pertinent Vitals/Pain Pain Assessment: Faces Faces Pain Scale: Hurts even more Pain Location: L knee Pain Descriptors / Indicators: Sore Pain Intervention(s): Monitored during session;Repositioned    Home Living Family/patient expects to be discharged to:: Private residence Living Arrangements: Children;Parent Available Help at Discharge: Family;Available 24 hours/day Type of Home: House Home Access: Stairs to enter Entrance Stairs-Rails: None(or 7 in the front with railing both sides) Entrance Stairs-Number of Steps: 2 Home Layout: One level Home Equipment: Crutches      Prior Function Level of Independence: Independent  Comments: works as a Sales executive Extremity Assessment Upper Extremity Assessment: Overall WFL for tasks assessed    Lower Extremity Assessment Lower Extremity Assessment: LLE deficits/detail LLE Deficits / Details: pt with  decreased strength and ROM limitations secondary to post-op pain and weakness. Pt was able to perform SLR in supine while maintaining knee extended       Communication   Communication: No difficulties  Cognition Arousal/Alertness: Awake/alert Behavior During Therapy: WFL for tasks assessed/performed Overall Cognitive Status: Within Functional Limits for tasks assessed                                        General Comments      Exercises Total Joint Exercises Ankle Circles/Pumps: AROM;Left;20 reps;Seated Long Arc Quad: AROM;Strengthening;Left;10 reps;Seated Knee Flexion: AROM;Strengthening;Left;10 reps;Seated   Assessment/Plan    PT Assessment Patient needs continued PT services  PT Problem List Decreased strength;Decreased range of motion;Decreased activity tolerance;Decreased balance;Decreased mobility;Decreased coordination;Decreased knowledge of use of DME;Decreased safety awareness;Decreased knowledge of precautions;Pain       PT Treatment Interventions DME instruction;Gait training;Stair training;Functional mobility training;Therapeutic activities;Therapeutic exercise;Balance training;Neuromuscular re-education;Patient/family education    PT Goals (Current goals can be found in the Care Plan section)  Acute Rehab PT Goals Patient Stated Goal: decrease pain PT Goal Formulation: With patient Time For Goal Achievement: 03/13/19 Potential to Achieve Goals: Good    Frequency 7X/week   Barriers to discharge        Co-evaluation               AM-PAC PT "6 Clicks" Mobility  Outcome Measure Help needed turning from your back to your side while in a flat bed without using bedrails?: None Help needed moving from lying on your back to sitting on the side of a flat bed without using bedrails?: None Help needed moving to and from a bed to a chair (including a wheelchair)?: None Help needed standing up from a chair using your arms (e.g., wheelchair or  bedside chair)?: None Help needed to walk in hospital room?: None Help needed climbing 3-5 steps with a railing? : A Little 6 Click Score: 23    End of Session Equipment Utilized During Treatment: Gait belt Activity Tolerance: Patient tolerated treatment well Patient left: in chair;with call bell/phone within reach Nurse Communication: Mobility status PT Visit Diagnosis: Other abnormalities of gait and mobility (R26.89);Pain Pain - Right/Left: Left Pain - part of body: Knee    Time: [id number] PT Time Calculation (min) (ACUTE ONLY): 28 min   Charges:   PT Evaluation $PT Eval Moderate Complexity: 1 Mod PT Treatments $Gait Training: 8-22 mins        Anastasio Champion, DPT  Acute Rehabilitation Services Pager [phone] Office [place] 02/27/2019, 10:56 AM

## 2019-02-28 DIAGNOSIS — Z882 Allergy status to sulfonamides status: Secondary | ICD-10-CM | POA: Diagnosis not present

## 2019-02-28 DIAGNOSIS — Z79899 Other long term (current) drug therapy: Secondary | ICD-10-CM | POA: Diagnosis not present

## 2019-02-28 DIAGNOSIS — G4730 Sleep apnea, unspecified: Secondary | ICD-10-CM | POA: Diagnosis not present

## 2019-02-28 DIAGNOSIS — E785 Hyperlipidemia, unspecified: Secondary | ICD-10-CM | POA: Diagnosis not present

## 2019-02-28 DIAGNOSIS — M1712 Unilateral primary osteoarthritis, left knee: Secondary | ICD-10-CM | POA: Diagnosis not present

## 2019-02-28 DIAGNOSIS — F419 Anxiety disorder, unspecified: Secondary | ICD-10-CM | POA: Diagnosis not present

## 2019-02-28 DIAGNOSIS — F329 Major depressive disorder, single episode, unspecified: Secondary | ICD-10-CM | POA: Diagnosis not present

## 2019-02-28 DIAGNOSIS — F17210 Nicotine dependence, cigarettes, uncomplicated: Secondary | ICD-10-CM | POA: Diagnosis not present

## 2019-02-28 LAB — CBC
HCT: 34.6 % — ABNORMAL LOW (ref 36.0–46.0)
Hemoglobin: 11.6 g/dL — ABNORMAL LOW (ref 12.0–15.0)
MCH: 31 pg (ref 26.0–34.0)
MCHC: 33.5 g/dL (ref 30.0–36.0)
MCV: 92.5 fL (ref 80.0–100.0)
Platelets: 206 10*3/uL (ref 150–400)
RBC: 3.74 MIL/uL — ABNORMAL LOW (ref 3.87–5.11)
RDW: 13 % (ref 11.5–15.5)
WBC: 11.8 10*3/uL — ABNORMAL HIGH (ref 4.0–10.5)
nRBC: 0 % (ref 0.0–0.2)

## 2019-02-28 MED ORDER — METHOCARBAMOL 500 MG PO TABS: 500.0000 mg | ORAL_TABLET | Freq: Four times a day (QID) | ORAL | 0 refills | Status: AC | PRN

## 2019-02-28 MED ORDER — ASPIRIN 325 MG PO TBEC: 325.0000 mg | DELAYED_RELEASE_TABLET | Freq: Every day | ORAL | 0 refills | Status: AC

## 2019-02-28 MED ORDER — OXYCODONE-ACETAMINOPHEN 7.5-325 MG PO TABS: 1.0000 | ORAL_TABLET | Freq: Four times a day (QID) | ORAL | 0 refills | Status: DC | PRN

## 2019-02-28 NOTE — Discharge Instructions (Signed)
INSTRUCTIONS AFTER JOINT REPLACEMENT   o Remove items at home which could result in a fall. This includes throw rugs or furniture in walking pathways o ICE to the affected joint every three hours while awake for 30 minutes at a time, for at least the first 3-5 days, and then as needed for pain and swelling.  Continue to use ice for pain and swelling. You may notice swelling that will progress down to the foot and ankle.  This is normal after surgery.  Elevate your leg when you are not up walking on it.   o Continue to use the breathing machine you got in the hospital (incentive spirometer) which will help keep your temperature down.  It is common for your temperature to cycle up and down following surgery, especially at night when you are not up moving around and exerting yourself.  The breathing machine keeps your lungs expanded and your temperature down.   DIET:  As you were doing prior to hospitalization, we recommend a well-balanced diet.  DRESSING / WOUND CARE / SHOWERING  You may change your dressing 3-5 days after surgery.  Then change the dressing every day with sterile gauze.  Please use good hand washing techniques before changing the dressing.  Do not use any lotions or creams on the incision until instructed by your surgeon. OK TO SHOWER 3 DAYS POSTOP.  USE GAUZE DRESSING AND TAPE OVER WOUND.  OR GAUZE DRESSING AND APPLY TED HOSE OVER.  ACTIVITY  o Increase activity slowly as tolerated, but follow the weight bearing instructions below.   o No driving for 6 weeks or until further direction given by your physician.  You cannot drive while taking narcotics.  o No lifting or carrying greater than 10 lbs. until further directed by your surgeon. o Avoid periods of inactivity such as sitting longer than an hour when not asleep. This helps prevent blood clots.  o You may return to work once you are authorized by your doctor.     WEIGHT BEARING   Weight bearing as tolerated with assist  device (walker, cane, etc) as directed, use it as long as suggested by your surgeon or therapist, typically at least 4-6 weeks.   EXERCISES  Results after joint replacement surgery are often greatly improved when you follow the exercise, range of motion and muscle strengthening exercises prescribed by your doctor. Safety measures are also important to protect the joint from further injury. Any time any of these exercises cause you to have increased pain or swelling, decrease what you are doing until you are comfortable again and then slowly increase them. If you have problems or questions, call your caregiver or physical therapist for advice.   Rehabilitation is important following a joint replacement. After just a few days of immobilization, the muscles of the leg can become weakened and shrink (atrophy).  These exercises are designed to build up the tone and strength of the thigh and leg muscles and to improve motion. Often times heat used for twenty to thirty minutes before working out will loosen up your tissues and help with improving the range of motion but do not use heat for the first two weeks following surgery (sometimes heat can increase post-operative swelling).   These exercises can be done on a training (exercise) mat, on the floor, on a table or on a bed. Use whatever works the best and is most comfortable for you.    Use music or television while you are exercising so  that the exercises are a pleasant break in your day. This will make your life better with the exercises acting as a break in your routine that you can look forward to.   Perform all exercises about fifteen times, three times per day or as directed.  You should exercise both the operative leg and the other leg as well.  Exercises include:    Quad Sets - Tighten up the muscle on the front of the thigh (Quad) and hold for 5-10 seconds.    Straight Leg Raises - With your knee straight (if you were given a brace, keep it on),  lift the leg to 60 degrees, hold for 3 seconds, and slowly lower the leg.  Perform this exercise against resistance later as your leg gets stronger.   Leg Slides: Lying on your back, slowly slide your foot toward your buttocks, bending your knee up off the floor (only go as far as is comfortable). Then slowly slide your foot back down until your leg is flat on the floor again.   Angel Wings: Lying on your back spread your legs to the side as far apart as you can without causing discomfort.   Hamstring Strength:  Lying on your back, push your heel against the floor with your leg straight by tightening up the muscles of your buttocks.  Repeat, but this time bend your knee to a comfortable angle, and push your heel against the floor.  You may put a pillow under the heel to make it more comfortable if necessary.   A rehabilitation program following joint replacement surgery can speed recovery and prevent re-injury in the future due to weakened muscles. Contact your doctor or a physical therapist for more information on knee rehabilitation.    CONSTIPATION  Constipation is defined medically as fewer than three stools per week and severe constipation as less than one stool per week.  Even if you have a regular bowel pattern at home, your normal regimen is likely to be disrupted due to multiple reasons following surgery.  Combination of anesthesia, postoperative narcotics, change in appetite and fluid intake all can affect your bowels.   YOU MUST use at least one of the following options; they are listed in order of increasing strength to get the job done.  They are all available over the counter, and you may need to use some, POSSIBLY even all of these options:    Drink plenty of fluids (prune juice may be helpful) and high fiber foods Colace 100 mg by mouth twice a day  Senokot for constipation as directed and as needed Dulcolax (bisacodyl), take with full glass of water  Miralax (polyethylene glycol)  once or twice a day as needed.  If you have tried all these things and are unable to have a bowel movement in the first 3-4 days after surgery call either your surgeon or your primary doctor.    If you experience loose stools or diarrhea, hold the medications until you stool forms back up.  If your symptoms do not get better within 1 week or if they get worse, check with your doctor.  If you experience "the worst abdominal pain ever" or develop nausea or vomiting, please contact the office immediately for further recommendations for treatment.   ITCHING:  If you experience itching with your medications, try taking only a single pain pill, or even half a pain pill at a time.  You can also use Benadryl over the counter for itching or also  to help with sleep.   TED HOSE STOCKINGS:  Use stockings on both legs until for at least 2 weeks or as directed by physician office. They may be removed at night for sleeping.  MEDICATIONS:  See your medication summary on the After Visit Summary that nursing will review with you.  You may have some home medications which will be placed on hold until you complete the course of blood thinner medication.  It is important for you to complete the blood thinner medication as prescribed.  PRECAUTIONS:  If you experience chest pain or shortness of breath - call 911 immediately for transfer to the hospital emergency department.   If you develop a fever greater that 101 F, purulent drainage from wound, increased redness or drainage from wound, foul odor from the wound/dressing, or calf pain - CONTACT YOUR SURGEON.                                                   FOLLOW-UP APPOINTMENTS:  If you do not already have a post-op appointment, please call the office for an appointment to be seen by your surgeon.  Guidelines for how soon to be seen are listed in your After Visit Summary, but are typically between 1-4 weeks after surgery.  OTHER INSTRUCTIONS:   Knee  Replacement:  Do not place pillow under knee, focus on keeping the knee straight while resting. CPM instructions: 0-90 degrees, 2 hours in the morning, 2 hours in the afternoon, and 2 hours in the evening. Place foam block, curve side up under heel at all times except when in CPM or when walking.  DO NOT modify, tear, cut, or change the foam block in any way.  MAKE SURE YOU:   Understand these instructions.   Get help right away if you are not doing well or get worse.    Thank you for letting us be a part of your medical care team.  It is a privilege we respect greatly.  We hope these instructions will help you stay on track for a fast and full recovery!

## 2019-02-28 NOTE — Progress Notes (Signed)
Subjective: Doing well.  Pain controlled.  Making good progress with PT.  wants to go home today.     Objective: Vital signs in last 24 hours: Temp:  [98 F (36.7 C)-98.4 F (36.9 C)] 98.4 F (36.9 C) (11/13 0738) Pulse Rate:  [77-85] 85 (11/13 0738) Resp:  [16-20] 16 (11/13 0738) BP: (148-183)/(83-93) 183/90 (11/13 0738) SpO2:  [91 %-99 %] 96 % (11/13 0738)  Intake/Output from previous day: 11/12 0701 - 11/13 0700 In: 480 [P.O.:480] Out: -  Intake/Output this shift: No intake/output data recorded.  Recent Labs    02/27/19 0307 02/28/19 0359  HGB 12.6 11.6*   Recent Labs    02/27/19 0307 02/28/19 0359  WBC 15.1* 11.8*  RBC 4.12 3.74*  HCT 38.5 34.6*  PLT 249 206   Recent Labs    02/27/19 0307  NA 135  K 4.1  CL 102  CO2 22  BUN 12  CREATININE 0.58  GLUCOSE 136*  CALCIUM 8.8*   No results for input(s): LABPT, INR in the last 72 hours.  Exam: Dressing c/d/i.  Calf nontender. NVI.  No signs of infection.      Assessment/Plan: D/c home today if continues to make good progress with PT.  Sent in scripts for percocet, robaxin, and aspirin.  F/u with Dr Lorin Mercy 2 weeks postop. Needs bilat ted hose.      Emily Bender 02/28/2019, 11:01 AM

## 2019-02-28 NOTE — Plan of Care (Signed)

## 2019-02-28 NOTE — TOC Transition Note (Signed)
Transition of Care [facility]) - CM/SW Discharge Note   Patient Details  Name: OCTA SANTIN MRN: [id number] Date of Birth: 1966/02/22  Transition of Care [facility]) CM/SW Contact:  Bartholomew Crews, RN Phone Number: [phone] 02/28/2019, 12:39 PM   Clinical Narrative:    Noted plans in chart for patient to transition home today. Verified delivery of RW with [employer]. Spoke with Tiffany at [place] at [facility] - referral accepted for home health PT.    Final next level of care: [place] Barriers to Discharge: No Barriers Identified   Patient Goals and CMS Choice Patient states their goals for this hospitalization and ongoing recovery are:: stop being in pain CMS Medicare.gov Compare Post Acute Care list provided to:: Patient Choice offered to / list presented to : Patient  Discharge Placement                       Discharge Plan and Services                DME Arranged: Walker rolling DME Agency: [employer] Date DME Agency Contacted: 02/28/19 Time DME Agency Contacted: 1238 Representative spoke with at DME Agency: [place]: PT [place]: [facility] [employer]) Date [place]: 02/28/19 Time [place]: 1238 Representative spoke with at [place]: [place] ([place]) Interventions     Readmission Risk Interventions No flowsheet data found.

## 2019-02-28 NOTE — Plan of Care (Signed)
  Problem: Education: Goal: Knowledge of General Education information will improve Description: Including pain rating scale, medication(s)/side effects and non-pharmacologic comfort measures Outcome: Progressing   Problem: Health Behavior/Discharge Planning: Goal: Ability to manage health-related needs will improve Outcome: Progressing   Problem: Clinical Measurements: Goal: Ability to maintain clinical measurements within normal limits will improve Outcome: Progressing Goal: Will remain free from infection Outcome: Progressing   Problem: Nutrition: Goal: Adequate nutrition will be maintained Outcome: Progressing   Problem: Coping: Goal: Level of anxiety will decrease Outcome: Progressing   Problem: Skin Integrity: Goal: Risk for impaired skin integrity will decrease Outcome: Progressing

## 2019-02-28 NOTE — Progress Notes (Signed)
No discharge date for patient encounter.  Discussed with the patient and all questioned fully answered. She will call me if any problems arise.  Transported via wheelchair by nurse tech to valet to catch ride.  Allergies as of 02/28/2019      Reactions   Sulfa Antibiotics Hives      Medication List    STOP taking these medications   Glucosamine 750 MG Tabs   meloxicam 7.5 MG tablet Commonly known as: MOBIC     TAKE these medications   aspirin 325 MG EC tablet Take 1 tablet (325 mg total) by mouth daily with breakfast. Start taking on: March 01, 2019   diazepam 5 MG tablet Commonly known as: VALIUM TAKE ONE TABLET (5 MG) BY MOUTH TWICE DAILY AS NEEDED FOR ANXIETY OR AGITATION What changed:   how much to take  how to take this  when to take this  reasons to take this  additional instructions   gabapentin 300 MG capsule Commonly known as: NEURONTIN TAKE 1 CAPSULE BY MOUTH 3 TIMES DAILY   methocarbamol 500 MG tablet Commonly known as: ROBAXIN Take 1 tablet (500 mg total) by mouth every 6 (six) hours as needed for muscle spasms.   niacin 500 MG tablet Take 500 mg by mouth daily.   oxyCODONE-acetaminophen 7.5-325 MG tablet Commonly known as: Percocet Take 1 tablet by mouth every 6 (six) hours as needed for severe pain.   sertraline 100 MG tablet Commonly known as: ZOLOFT TAKE ONE TABLET EVERY DAY AT BEDTIME What changed:   how much to take  how to take this  when to take this  additional instructions            Durable Medical Equipment  (From admission, onward)         Start     Ordered   02/27/19 1549  For home use only DME Walker  Once    Comments: Rolling walker 5 inch front wheels for TKA thanks  Question:  Patient needs a walker to treat with the following condition  Answer:  Total knee replacement status   02/27/19 1549

## 2019-02-28 NOTE — Progress Notes (Signed)
Physical Therapy Treatment Patient Details Name: Emily Bender MRN: [id number] DOB: [birth year] Today's Date: 02/28/2019    History of Present Illness Pt is a 53 y/o female s/p elective L TKA. PMH including but not limited to HLD and prior back surgeries.    PT Comments    Pt making steady progress with functional mobility and participated in stair training this session without difficulties. Plan is for pt to d/c home today with family. Pt is ready for d/c home from PT perspective.   Follow Up Recommendations  Home health PT;Other (comment)(with quick progression to OPPT)     Equipment Recommendations  Rolling walker with 5" wheels    Recommendations for Other Services       Precautions / Restrictions Precautions Precautions: Fall;Knee Restrictions Weight Bearing Restrictions: Yes LLE Weight Bearing: Weight bearing as tolerated    Mobility  Bed Mobility Overal bed mobility: Needs Assistance Bed Mobility: Supine to Sit;Sit to Supine     Supine to sit: Supervision Sit to supine: Supervision   General bed mobility comments: no physical assistance needed, increased time and effort  Transfers Overall transfer level: Needs assistance Equipment used: Rolling walker (2 wheeled) Transfers: Sit to/from Stand Sit to Stand: Supervision         General transfer comment: good technique utilized, supervision for safety  Ambulation/Gait Ambulation/Gait assistance: Supervision Gait Distance (Feet): 150 Feet Assistive device: Rolling walker (2 wheeled) Gait Pattern/deviations: Step-to pattern;Step-through pattern;Decreased step length - right;Decreased step length - left;Decreased stance time - left;Decreased stride length;Decreased weight shift to left;Antalgic Gait velocity: decreased   General Gait Details: pt overall steady with RW, no LOB or need for physical assistance, min guard for safety; pt using a step-to gait pattern with decreased knee flexion during swing phase  on L and unable to achieve terminal knee extension in stance phase on L; progressing to an emerging step-through pattern with cueing and demonstration   Stairs Stairs: Yes Stairs assistance: Min guard Stair Management: No rails;Step to pattern;Backwards;With walker Number of Stairs: 2 General stair comments: PT provided instruction and demonstration for ascending/descending with RW backwards to simulate home set up   Wheelchair Mobility    Modified Rankin (Stroke Patients Only)       Balance Overall balance assessment: Needs assistance Sitting-balance support: Feet supported Sitting balance-Leahy Scale: Good     Standing balance support: No upper extremity supported Standing balance-Leahy Scale: Fair                              Cognition Arousal/Alertness: Awake/alert Behavior During Therapy: WFL for tasks assessed/performed Overall Cognitive Status: Within Functional Limits for tasks assessed                                        Exercises      General Comments        Pertinent Vitals/Pain Pain Assessment: 0-10 Pain Score: 10-Worst pain ever Pain Location: L knee Pain Descriptors / Indicators: Sore Pain Intervention(s): Monitored during session;Repositioned    Home Living                      Prior Function            PT Goals (current goals can now be found in the care plan section) Acute Rehab PT Goals PT Goal Formulation: With patient  Time For Goal Achievement: 03/13/19 Potential to Achieve Goals: Good Progress towards PT goals: Progressing toward goals    Frequency    7X/week      PT Plan Current plan remains appropriate    Co-evaluation              AM-PAC PT "6 Clicks" Mobility   Outcome Measure  Help needed turning from your back to your side while in a flat bed without using bedrails?: None Help needed moving from lying on your back to sitting on the side of a flat bed without using  bedrails?: None Help needed moving to and from a bed to a chair (including a wheelchair)?: None Help needed standing up from a chair using your arms (e.g., wheelchair or bedside chair)?: None Help needed to walk in hospital room?: None Help needed climbing 3-5 steps with a railing? : A Little 6 Click Score: 23    End of Session Equipment Utilized During Treatment: Gait belt Activity Tolerance: Patient tolerated treatment well Patient left: in bed;with call bell/phone within reach Nurse Communication: Mobility status PT Visit Diagnosis: Other abnormalities of gait and mobility (R26.89);Pain Pain - Right/Left: Left Pain - part of body: Knee     Time: 1411-1436 PT Time Calculation (min) (ACUTE ONLY): 25 min  Charges:  $Gait Training: 23-37 mins                     Anastasio Champion, DPT  Acute Rehabilitation Services Pager [phone] Office [place] 02/28/2019, 4:05 PM

## 2019-02-28 NOTE — Progress Notes (Signed)
Physical Therapy Treatment Patient Details Name: Emily Bender MRN: [id number] DOB: Aug 07, 1965 Today's Date: 02/28/2019    History of Present Illness Pt is a 53 y/o female s/p elective L TKA. PMH including but not limited to HLD and prior back surgeries.    PT Comments    Pt continuing to make steady progress with mobility. Will plan for stair training at next session if appropriate. Pt would continue to benefit from skilled physical therapy services at this time while admitted and after d/c to address the below listed limitations in order to improve overall safety and independence with functional mobility.    Follow Up Recommendations  Home health PT;Other (comment)(with quick progression to OPPT)     Equipment Recommendations  Rolling walker with 5" wheels    Recommendations for Other Services       Precautions / Restrictions Precautions Precautions: Fall;Knee Precaution Comments: reviewed positioning of LE following knee surgery with pt Restrictions Weight Bearing Restrictions: Yes LLE Weight Bearing: Weight bearing as tolerated    Mobility  Bed Mobility Overal bed mobility: Needs Assistance Bed Mobility: Supine to Sit;Sit to Supine     Supine to sit: Supervision Sit to supine: Supervision   General bed mobility comments: no physical assistance needed, increased time and effort  Transfers Overall transfer level: Needs assistance Equipment used: Rolling walker (2 wheeled) Transfers: Sit to/from Stand Sit to Stand: Supervision         General transfer comment: good technique utilized, supervision for safety  Ambulation/Gait Ambulation/Gait assistance: Supervision Gait Distance (Feet): 75 Feet Assistive device: Rolling walker (2 wheeled) Gait Pattern/deviations: Step-to pattern;Step-through pattern;Decreased step length - right;Decreased step length - left;Decreased stance time - left;Decreased stride length;Decreased weight shift to left;Antalgic Gait  velocity: decreased   General Gait Details: pt overall steady with RW, no LOB or need for physical assistance, min guard for safety; pt using a step-to gait pattern with decreased knee flexion during swing phase on L and unable to achieve terminal knee extension in stance phase on L; progressing to an emerging step-through pattern with cueing and demonstration   Stairs             Wheelchair Mobility    Modified Rankin (Stroke Patients Only)       Balance Overall balance assessment: Needs assistance Sitting-balance support: Feet supported Sitting balance-Leahy Scale: Good     Standing balance support: Bilateral upper extremity supported;Single extremity supported Standing balance-Leahy Scale: Poor                              Cognition Arousal/Alertness: Awake/alert Behavior During Therapy: WFL for tasks assessed/performed Overall Cognitive Status: Within Functional Limits for tasks assessed                                        Exercises Total Joint Exercises Hip ABduction/ADduction: AROM;Strengthening;Left;10 reps;Standing Knee Flexion: AROM;Strengthening;Left;10 reps;Standing Marching in Standing: AROM;Strengthening;Left;10 reps;Standing General Exercises - Lower Extremity Mini-Sqauts: Strengthening;10 reps;Standing    General Comments        Pertinent Vitals/Pain Pain Assessment: 0-10 Pain Score: 10-Worst pain ever Pain Location: L knee Pain Descriptors / Indicators: Sore Pain Intervention(s): Monitored during session;Repositioned    Home Living                      Prior Function  PT Goals (current goals can now be found in the care plan section) Acute Rehab PT Goals PT Goal Formulation: With patient Time For Goal Achievement: 03/13/19 Potential to Achieve Goals: Good Progress towards PT goals: Progressing toward goals    Frequency    7X/week      PT Plan Current plan remains appropriate     Co-evaluation              AM-PAC PT "6 Clicks" Mobility   Outcome Measure  Help needed turning from your back to your side while in a flat bed without using bedrails?: None Help needed moving from lying on your back to sitting on the side of a flat bed without using bedrails?: None Help needed moving to and from a bed to a chair (including a wheelchair)?: None Help needed standing up from a chair using your arms (e.g., wheelchair or bedside chair)?: None Help needed to walk in hospital room?: None Help needed climbing 3-5 steps with a railing? : A Little 6 Click Score: 23    End of Session   Activity Tolerance: Patient tolerated treatment well Patient left: in bed;with call bell/phone within reach Nurse Communication: Mobility status PT Visit Diagnosis: Other abnormalities of gait and mobility (R26.89);Pain Pain - Right/Left: Left Pain - part of body: Knee     Time: [id number] PT Time Calculation (min) (ACUTE ONLY): 19 min  Charges:  $Gait Training: 8-22 mins                     Anastasio Champion, DPT  Acute Rehabilitation Services Pager [phone] Office [place] 02/28/2019, 10:39 AM

## 2019-03-01 ENCOUNTER — Other Ambulatory Visit: Payer: Self-pay | Admitting: Family Medicine

## 2019-03-01 DIAGNOSIS — F329 Major depressive disorder, single episode, unspecified: Secondary | ICD-10-CM

## 2019-03-01 DIAGNOSIS — F32A Depression, unspecified: Secondary | ICD-10-CM

## 2019-03-01 DIAGNOSIS — F419 Anxiety disorder, unspecified: Secondary | ICD-10-CM

## 2019-03-03 DIAGNOSIS — F419 Anxiety disorder, unspecified: Secondary | ICD-10-CM | POA: Diagnosis not present

## 2019-03-03 DIAGNOSIS — G4730 Sleep apnea, unspecified: Secondary | ICD-10-CM | POA: Diagnosis not present

## 2019-03-03 DIAGNOSIS — F329 Major depressive disorder, single episode, unspecified: Secondary | ICD-10-CM | POA: Diagnosis not present

## 2019-03-03 DIAGNOSIS — Z96652 Presence of left artificial knee joint: Secondary | ICD-10-CM | POA: Diagnosis not present

## 2019-03-03 DIAGNOSIS — E559 Vitamin D deficiency, unspecified: Secondary | ICD-10-CM | POA: Diagnosis not present

## 2019-03-03 DIAGNOSIS — F17210 Nicotine dependence, cigarettes, uncomplicated: Secondary | ICD-10-CM | POA: Diagnosis not present

## 2019-03-03 DIAGNOSIS — Z7982 Long term (current) use of aspirin: Secondary | ICD-10-CM | POA: Diagnosis not present

## 2019-03-03 DIAGNOSIS — Z471 Aftercare following joint replacement surgery: Secondary | ICD-10-CM | POA: Diagnosis not present

## 2019-03-03 DIAGNOSIS — E785 Hyperlipidemia, unspecified: Secondary | ICD-10-CM | POA: Diagnosis not present

## 2019-03-03 NOTE — Discharge Summary (Signed)
Patient ID: Emily Bender MRN: [id number] DOB/AGE: 52-Jan-1967 52 y.o.  Admit date: 02/26/2019 Discharge date: 03/03/2019  Admission Diagnoses:  Active Problems:   Unilateral primary osteoarthritis, left knee   Arthritis of left knee   Discharge Diagnoses:  Active Problems:   Unilateral primary osteoarthritis, left knee   Arthritis of left knee  status post Procedure(s): LEFT TOTAL KNEE ARTHROPLASTY-CEMENTED  Past Medical History:  Diagnosis Date  . Anxiety   . Arthritis   . Depression   . Hyperlipidemia   . Lumbar stenosis   . Sleep apnea    Waiting for Cpap    Surgeries: Procedure(s): LEFT TOTAL KNEE ARTHROPLASTY-CEMENTED on 02/26/2019   Consultants:   Discharged Condition: Improved  Hospital Course: Emily WELLHAUSEN is an 53 y.o. female who was admitted 02/26/2019 for operative treatment of <principal problem not specified>. Patient failed conservative treatments (please see the history and physical for the specifics) and had severe unremitting pain that affects sleep, daily activities and work/hobbies. After pre-op clearance, the patient was taken to the operating room on 02/26/2019 and underwent  Procedure(s): LEFT TOTAL KNEE ARTHROPLASTY-CEMENTED.    Patient was given perioperative antibiotics:  Anti-infectives (From admission, onward)   Start     Dose/Rate Route Frequency Ordered Stop   02/26/19 2100  ceFAZolin (ANCEF) IVPB 1 g/50 mL premix     1 g 100 mL/hr over 30 Minutes Intravenous Every 8 hours 02/26/19 1711 02/27/19 0615   02/26/19 1115  ceFAZolin (ANCEF) IVPB 2g/100 mL premix     2 g 200 mL/hr over 30 Minutes Intravenous On call to O.R. 02/26/19 1106 02/26/19 1246   02/26/19 1111  ceFAZolin (ANCEF) 2-4 GM/100ML-% IVPB    Note to Pharmacy: Cordelia Pen   : cabinet override      02/26/19 1111 02/26/19 1246       Patient was given sequential compression devices and early ambulation to prevent DVT.   Patient benefited maximally from hospital  stay and there were no complications. At the time of discharge, the patient was urinating/moving their bowels without difficulty, tolerating a regular diet, pain is controlled with oral pain medications and they have been cleared by PT/OT.   Recent vital signs: No data found.   Recent laboratory studies: No results for input(s): WBC, HGB, HCT, PLT, NA, K, CL, CO2, BUN, CREATININE, GLUCOSE, INR, CALCIUM in the last 72 hours.  Invalid input(s): PT, 2   Discharge Medications:   Allergies as of 02/28/2019      Reactions   Sulfa Antibiotics Hives      Medication List    STOP taking these medications   Glucosamine 750 MG Tabs   meloxicam 7.5 MG tablet Commonly known as: MOBIC     TAKE these medications   aspirin 325 MG EC tablet Take 1 tablet (325 mg total) by mouth daily with breakfast.   diazepam 5 MG tablet Commonly known as: VALIUM TAKE ONE TABLET (5 MG) BY MOUTH TWICE DAILY AS NEEDED FOR ANXIETY OR AGITATION What changed:   how much to take  how to take this  when to take this  reasons to take this  additional instructions   gabapentin 300 MG capsule Commonly known as: NEURONTIN TAKE 1 CAPSULE BY MOUTH 3 TIMES DAILY   methocarbamol 500 MG tablet Commonly known as: ROBAXIN Take 1 tablet (500 mg total) by mouth every 6 (six) hours as needed for muscle spasms.   niacin 500 MG tablet Take 500 mg by mouth daily.  oxyCODONE-acetaminophen 7.5-325 MG tablet Commonly known as: Percocet Take 1 tablet by mouth every 6 (six) hours as needed for severe pain.   sertraline 100 MG tablet Commonly known as: ZOLOFT TAKE ONE TABLET EVERY DAY AT BEDTIME What changed:   how much to take  how to take this  when to take this  additional instructions       Diagnostic Studies: Dg Knee 1-2 Views Left  Result Date: 02/26/2019 CLINICAL DATA:  Postop total left knee replacement. EXAM: LEFT KNEE - 1-2 VIEW COMPARISON:  Preoperative radiograph 01/08/2019 FINDINGS: Total  left knee arthroplasty in expected alignment. No periprosthetic lucency or fracture. There is been patellar resurfacing. Recent postsurgical change includes air and edema in the soft tissues and joint. Anterior skin staples noted. IMPRESSION: Post left knee arthroplasty without immediate postoperative complication. Electronically Signed   By: Keith Rake M.D.   On: 02/26/2019 15:20      Follow-up Information    Emily Killings, MD. Schedule an appointment as soon as possible for a visit today.   Specialty: Orthopedic Surgery Why: NEEDS RETURN OFFICE VISIT 2 WEEKS POSTOP Contact information: [address] [place] [address] [phone]        [facility] up.   Specialty: [place] Why: someone from the office will call to schedule physical therapy visits Contact information: [address] [place] [address] [phone]           Discharge Plan:  discharge to home  Disposition:     Signed: Benjiman Core  03/03/2019, 8:45 AM

## 2019-03-04 ENCOUNTER — Telehealth: Payer: Self-pay | Admitting: Radiology

## 2019-03-04 DIAGNOSIS — G4733 Obstructive sleep apnea (adult) (pediatric): Secondary | ICD-10-CM | POA: Diagnosis not present

## 2019-03-04 NOTE — Telephone Encounter (Signed)
Sonia Side was in the office and states that they are going to see patient for HHPT 3x a week this week and 3x a week next week. Then the patient would like to transition to outpatient PT for total knee rehab at [facility] in [place]. Patient would like for me to send referral to [facility] for PT to begin first week of December.  OK for order?

## 2019-03-04 NOTE — Telephone Encounter (Signed)
OK - thanks

## 2019-03-05 DIAGNOSIS — Z7982 Long term (current) use of aspirin: Secondary | ICD-10-CM | POA: Diagnosis not present

## 2019-03-05 DIAGNOSIS — F329 Major depressive disorder, single episode, unspecified: Secondary | ICD-10-CM | POA: Diagnosis not present

## 2019-03-05 DIAGNOSIS — E785 Hyperlipidemia, unspecified: Secondary | ICD-10-CM | POA: Diagnosis not present

## 2019-03-05 DIAGNOSIS — Z96652 Presence of left artificial knee joint: Secondary | ICD-10-CM | POA: Diagnosis not present

## 2019-03-05 DIAGNOSIS — Z471 Aftercare following joint replacement surgery: Secondary | ICD-10-CM | POA: Diagnosis not present

## 2019-03-05 DIAGNOSIS — F17210 Nicotine dependence, cigarettes, uncomplicated: Secondary | ICD-10-CM | POA: Diagnosis not present

## 2019-03-05 DIAGNOSIS — G4730 Sleep apnea, unspecified: Secondary | ICD-10-CM | POA: Diagnosis not present

## 2019-03-05 DIAGNOSIS — E559 Vitamin D deficiency, unspecified: Secondary | ICD-10-CM | POA: Diagnosis not present

## 2019-03-05 DIAGNOSIS — F419 Anxiety disorder, unspecified: Secondary | ICD-10-CM | POA: Diagnosis not present

## 2019-03-07 DIAGNOSIS — F329 Major depressive disorder, single episode, unspecified: Secondary | ICD-10-CM | POA: Diagnosis not present

## 2019-03-07 DIAGNOSIS — G4730 Sleep apnea, unspecified: Secondary | ICD-10-CM | POA: Diagnosis not present

## 2019-03-07 DIAGNOSIS — F17210 Nicotine dependence, cigarettes, uncomplicated: Secondary | ICD-10-CM | POA: Diagnosis not present

## 2019-03-07 DIAGNOSIS — E559 Vitamin D deficiency, unspecified: Secondary | ICD-10-CM | POA: Diagnosis not present

## 2019-03-07 DIAGNOSIS — F419 Anxiety disorder, unspecified: Secondary | ICD-10-CM | POA: Diagnosis not present

## 2019-03-07 DIAGNOSIS — Z7982 Long term (current) use of aspirin: Secondary | ICD-10-CM | POA: Diagnosis not present

## 2019-03-07 DIAGNOSIS — E785 Hyperlipidemia, unspecified: Secondary | ICD-10-CM | POA: Diagnosis not present

## 2019-03-07 DIAGNOSIS — Z96652 Presence of left artificial knee joint: Secondary | ICD-10-CM | POA: Diagnosis not present

## 2019-03-07 DIAGNOSIS — Z471 Aftercare following joint replacement surgery: Secondary | ICD-10-CM | POA: Diagnosis not present

## 2019-03-10 DIAGNOSIS — E559 Vitamin D deficiency, unspecified: Secondary | ICD-10-CM | POA: Diagnosis not present

## 2019-03-10 DIAGNOSIS — Z96652 Presence of left artificial knee joint: Secondary | ICD-10-CM | POA: Diagnosis not present

## 2019-03-10 DIAGNOSIS — Z7982 Long term (current) use of aspirin: Secondary | ICD-10-CM | POA: Diagnosis not present

## 2019-03-10 DIAGNOSIS — F419 Anxiety disorder, unspecified: Secondary | ICD-10-CM | POA: Diagnosis not present

## 2019-03-10 DIAGNOSIS — Z471 Aftercare following joint replacement surgery: Secondary | ICD-10-CM | POA: Diagnosis not present

## 2019-03-10 DIAGNOSIS — F17210 Nicotine dependence, cigarettes, uncomplicated: Secondary | ICD-10-CM | POA: Diagnosis not present

## 2019-03-10 DIAGNOSIS — E785 Hyperlipidemia, unspecified: Secondary | ICD-10-CM | POA: Diagnosis not present

## 2019-03-10 DIAGNOSIS — G4730 Sleep apnea, unspecified: Secondary | ICD-10-CM | POA: Diagnosis not present

## 2019-03-10 DIAGNOSIS — F329 Major depressive disorder, single episode, unspecified: Secondary | ICD-10-CM | POA: Diagnosis not present

## 2019-03-11 ENCOUNTER — Other Ambulatory Visit: Payer: Self-pay | Admitting: Radiology

## 2019-03-11 DIAGNOSIS — Z96652 Presence of left artificial knee joint: Secondary | ICD-10-CM

## 2019-03-11 NOTE — Telephone Encounter (Signed)
Order placed

## 2019-03-12 ENCOUNTER — Encounter: Payer: Self-pay | Admitting: Orthopaedic Surgery

## 2019-03-12 ENCOUNTER — Ambulatory Visit (INDEPENDENT_AMBULATORY_CARE_PROVIDER_SITE_OTHER): Payer: BC Managed Care – PPO | Admitting: Orthopaedic Surgery

## 2019-03-12 ENCOUNTER — Other Ambulatory Visit: Payer: Self-pay

## 2019-03-12 DIAGNOSIS — Z96652 Presence of left artificial knee joint: Secondary | ICD-10-CM

## 2019-03-12 MED ORDER — OXYCODONE-ACETAMINOPHEN 5-325 MG PO TABS: 1.0000 | ORAL_TABLET | ORAL | 0 refills | Status: DC | PRN

## 2019-03-12 NOTE — Progress Notes (Signed)
Post-Op Visit Note   Patient: Emily Bender           Date of Birth: [birth year]           MRN: [id number] Visit Date: 03/12/2019 PCP: Margo Common, PA   Assessment & Plan: Post left total knee arthroplasty.  She works [employer] certified [employer] and work slip given no work x1 month.  I plan to recheck her in 4 weeks.  She already is at 2 degrees from reaching full extension is already flexing 116 degrees with home health PT making rapid progress.  Staples harvested.  She is happy with the surgical result.  Chief Complaint:  Chief Complaint  Patient presents with  . Left Knee - Routine Post Op   Visit Diagnoses:  1. S/P total knee arthroplasty, left     Plan: Staples harvested.  Return 4 weeks.  Work slip given no work x4 weeks.  Follow-Up Instructions: No follow-ups on file.   Orders:  No orders of the defined types were placed in this encounter.  Meds ordered this encounter  Medications  . oxyCODONE-acetaminophen (PERCOCET/ROXICET) 5-325 MG tablet    Sig: Take 1-2 tablets by mouth every 4 (four) hours as needed for severe pain. Post op pain    Dispense:  30 tablet    Refill:  0    Imaging: No results found.  PMFS History: Patient Active Problem List   Diagnosis Date Noted  . S/P total knee arthroplasty, left 03/12/2019  . Arthritis of left knee 02/26/2019  . Wound infection 11/18/2018  . Lumbar stenosis 10/30/2018  . Herniated nucleus pulposus, lumbar 10/30/2018  . Spinal stenosis of lumbar region 10/16/2018  . Lumbar herniated disc 10/14/2018  . Alcohol drinker 05/20/2015  . Anxiety and depression 05/20/2015  . History of artificial joint 05/20/2015  . HLD (hyperlipidemia) 05/20/2015  . Avitaminosis D 05/20/2015  . Chronic cough 01/20/2014  . Adiposity 01/18/2012  . Irregular bleeding 11/27/2006  . Tobacco use 06/23/2005   Past Medical History:  Diagnosis Date  . Anxiety   . Arthritis   . Depression   . Hyperlipidemia   . Lumbar stenosis   . Sleep apnea    Waiting for Cpap    Family History  Problem Relation Age of Onset  . Lung cancer Father   . Healthy Brother   . Aneurysm Maternal Grandmother   . Breast cancer Maternal Grandmother        mat great gm  . Lung cancer Maternal Grandfather   . Coronary artery disease Paternal Grandmother   . Breast cancer Paternal Grandmother   . Diabetes Mother   . Hypertension Mother     Past Surgical History:  Procedure Laterality Date  . ANKLE SURGERY  01/17/2012   x3  . APPLICATION OF WOUND VAC N/A 11/18/2018   Procedure: Application Of Wound Vac;  Surgeon: Marybelle Killings, MD;  Location: [place];  Service: Orthopedics;  Laterality: N/A;  . BACK SURGERY    . BREAST BIOPSY Left 1990's   neg  . CHOLECYSTECTOMY  2000  . COLONOSCOPY WITH PROPOFOL N/A 01/31/2019   Procedure: COLONOSCOPY WITH PROPOFOL;  Surgeon: Jonathon Bellows, MD;  Location: [facility] ENDOSCOPY;  Service: Gastroenterology;  Laterality: N/A;  . I&D EXTREMITY N/A 11/18/2018   Procedure: EVACUATION OF LUMBAR SEROMA/HEMATOMA;  Surgeon: Marybelle Killings, MD;  Location: [place];  Service: Orthopedics;  Laterality: N/A;  . LAMINECTOMY  1996  . LUMBAR LAMINECTOMY N/A 10/30/2018   Procedure: left L3 & L4 hemilaminectomy,  removal of herniated nucleus pulposus;  Surgeon: Marybelle Killings, MD;  Location: [place];  Service: Orthopedics;  Laterality: N/A;  . TOTAL KNEE ARTHROPLASTY Left 02/26/2019   Procedure: LEFT TOTAL KNEE ARTHROPLASTY-CEMENTED;  Surgeon: Marybelle Killings, MD;  Location: [place];  Service: Orthopedics;  Laterality: Left;  . WRIST SURGERY Right    Social History   Occupational History  . Not on file  Tobacco Use  . Smoking status: Current Every Day Smoker    Packs/day: 0.25    Years: 20.00    Pack years: 5.00    Types: Cigarettes  . Smokeless tobacco: Never Used  Substance and Sexual Activity  . Alcohol use: Yes    Alcohol/week: 6.0 standard drinks    Types: 6 Cans of beer per week    Comment: none last hrs  . Drug use: Yes    Frequency: 7.0  times per week    Types: Marijuana  . Sexual activity: Not on file

## 2019-03-14 ENCOUNTER — Telehealth: Payer: Self-pay

## 2019-03-14 DIAGNOSIS — E785 Hyperlipidemia, unspecified: Secondary | ICD-10-CM | POA: Diagnosis not present

## 2019-03-14 DIAGNOSIS — F17210 Nicotine dependence, cigarettes, uncomplicated: Secondary | ICD-10-CM | POA: Diagnosis not present

## 2019-03-14 DIAGNOSIS — E559 Vitamin D deficiency, unspecified: Secondary | ICD-10-CM | POA: Diagnosis not present

## 2019-03-14 DIAGNOSIS — Z471 Aftercare following joint replacement surgery: Secondary | ICD-10-CM | POA: Diagnosis not present

## 2019-03-14 DIAGNOSIS — G4730 Sleep apnea, unspecified: Secondary | ICD-10-CM | POA: Diagnosis not present

## 2019-03-14 DIAGNOSIS — F329 Major depressive disorder, single episode, unspecified: Secondary | ICD-10-CM | POA: Diagnosis not present

## 2019-03-14 DIAGNOSIS — F419 Anxiety disorder, unspecified: Secondary | ICD-10-CM | POA: Diagnosis not present

## 2019-03-14 DIAGNOSIS — Z7982 Long term (current) use of aspirin: Secondary | ICD-10-CM | POA: Diagnosis not present

## 2019-03-14 DIAGNOSIS — Z96652 Presence of left artificial knee joint: Secondary | ICD-10-CM | POA: Diagnosis not present

## 2019-03-14 NOTE — Telephone Encounter (Signed)
CPAP Form filled out for [employer] but waiting on a copy of the last sleep study completed.

## 2019-03-19 DIAGNOSIS — R2689 Other abnormalities of gait and mobility: Secondary | ICD-10-CM | POA: Diagnosis not present

## 2019-03-19 DIAGNOSIS — M25562 Pain in left knee: Secondary | ICD-10-CM | POA: Diagnosis not present

## 2019-03-19 DIAGNOSIS — Z96642 Presence of left artificial hip joint: Secondary | ICD-10-CM | POA: Diagnosis not present

## 2019-03-21 DIAGNOSIS — M25562 Pain in left knee: Secondary | ICD-10-CM | POA: Diagnosis not present

## 2019-03-21 DIAGNOSIS — Z96642 Presence of left artificial hip joint: Secondary | ICD-10-CM | POA: Diagnosis not present

## 2019-03-21 DIAGNOSIS — R2689 Other abnormalities of gait and mobility: Secondary | ICD-10-CM | POA: Diagnosis not present

## 2019-03-24 DIAGNOSIS — Z96642 Presence of left artificial hip joint: Secondary | ICD-10-CM | POA: Diagnosis not present

## 2019-03-24 DIAGNOSIS — R2689 Other abnormalities of gait and mobility: Secondary | ICD-10-CM | POA: Diagnosis not present

## 2019-03-24 DIAGNOSIS — M25562 Pain in left knee: Secondary | ICD-10-CM | POA: Diagnosis not present

## 2019-03-26 DIAGNOSIS — M25562 Pain in left knee: Secondary | ICD-10-CM | POA: Diagnosis not present

## 2019-03-26 DIAGNOSIS — R2689 Other abnormalities of gait and mobility: Secondary | ICD-10-CM | POA: Diagnosis not present

## 2019-03-26 DIAGNOSIS — Z96642 Presence of left artificial hip joint: Secondary | ICD-10-CM | POA: Diagnosis not present

## 2019-03-28 DIAGNOSIS — Z96642 Presence of left artificial hip joint: Secondary | ICD-10-CM | POA: Diagnosis not present

## 2019-03-28 DIAGNOSIS — R2689 Other abnormalities of gait and mobility: Secondary | ICD-10-CM | POA: Diagnosis not present

## 2019-03-28 DIAGNOSIS — M25562 Pain in left knee: Secondary | ICD-10-CM | POA: Diagnosis not present

## 2019-03-31 ENCOUNTER — Other Ambulatory Visit: Payer: Self-pay | Admitting: Orthopaedic Surgery

## 2019-03-31 NOTE — Telephone Encounter (Signed)
Please advise 

## 2019-03-31 NOTE — Telephone Encounter (Signed)
I called. Time to get off narcotics, one month post op, aleve 2 po bid and tylenol . She is 0 -120 ROM. Doing well.

## 2019-03-31 NOTE — Telephone Encounter (Signed)
You will have to refuse the rx. I am unable to do that anymore. Sorry.

## 2019-03-31 NOTE — Telephone Encounter (Signed)
noted 

## 2019-04-02 DIAGNOSIS — M25562 Pain in left knee: Secondary | ICD-10-CM | POA: Diagnosis not present

## 2019-04-02 DIAGNOSIS — Z96642 Presence of left artificial hip joint: Secondary | ICD-10-CM | POA: Diagnosis not present

## 2019-04-02 DIAGNOSIS — R2689 Other abnormalities of gait and mobility: Secondary | ICD-10-CM | POA: Diagnosis not present

## 2019-04-03 ENCOUNTER — Telehealth: Payer: Self-pay | Admitting: Family Medicine

## 2019-04-03 ENCOUNTER — Ambulatory Visit: Payer: BC Managed Care – PPO | Admitting: Family Medicine

## 2019-04-03 DIAGNOSIS — F419 Anxiety disorder, unspecified: Secondary | ICD-10-CM

## 2019-04-03 DIAGNOSIS — R710 Precipitous drop in hematocrit: Secondary | ICD-10-CM

## 2019-04-03 DIAGNOSIS — R7309 Other abnormal glucose: Secondary | ICD-10-CM

## 2019-04-03 DIAGNOSIS — F32A Depression, unspecified: Secondary | ICD-10-CM

## 2019-04-03 DIAGNOSIS — D72829 Elevated white blood cell count, unspecified: Secondary | ICD-10-CM

## 2019-04-03 DIAGNOSIS — F329 Major depressive disorder, single episode, unspecified: Secondary | ICD-10-CM

## 2019-04-03 DIAGNOSIS — G4733 Obstructive sleep apnea (adult) (pediatric): Secondary | ICD-10-CM | POA: Diagnosis not present

## 2019-04-03 MED ORDER — DIAZEPAM 5 MG PO TABS: ORAL_TABLET | ORAL | 0 refills | Status: DC

## 2019-04-03 NOTE — Telephone Encounter (Signed)
Pt's appt was canceled with Vernie Murders for 04-04-19. Simona Huh will be out until 04-21-2019. However, pt is having UR symptoms and coughing. Pt was offered a virtual or a telephone visit with another provider, but declined. Her visit from 12-18 was supposed to include labs.  She insisted the labs be done before 04-17-19 due to meeting insurance deductible.   Pt wanting a call back to let her know on this or what can be done at [phone].   Pt needing a refill on her diazepam (VALIUM) 5 MG tablet at  [place], [place] Phone:  [phone]  Fax:  [phone]      Thanks, [facility]

## 2019-04-03 NOTE — Progress Notes (Signed)
Patient: Emily Bender Female    DOB: [birth year]   53 y.o.   MRN: [id number] Visit Date: 04/03/2019  Today's Provider: Mar Daring, PA-C   No chief complaint on file.  Subjective:     Virtual Visit via Telephone Note  I connected with Emily Bender on 04/03/19 at 10:40 AM EST by telephone and verified that I am speaking with the correct person using two identifiers.  Location: Patient: PT for her knee Provider: [facility]   I discussed the limitations, risks, security and privacy concerns of performing an evaluation and management service by telephone and the availability of in person appointments. I also discussed with the patient that there may be a patient responsible charge related to this service. The patient expressed understanding and agreed to proceed.   URI  This is a new problem. The current episode started 1 to 4 weeks ago. The problem has been gradually worsening. There has been no fever. Associated symptoms include congestion, headaches, rhinorrhea, sinus pain and a sore throat. Pertinent negatives include no coughing, ear pain, swollen glands or wheezing. Treatments tried: mucinex. The treatment provided no relief.    Started using a CPAP 2 weeks ago and it has caused sinus pressure, congestion, post nasal drainage, and now chest congestion and cough.   Allergies  Allergen Reactions  . Sulfa Antibiotics Hives     Current Outpatient Medications:  .  aspirin EC 325 MG EC tablet, Take 1 tablet (325 mg total) by mouth daily with breakfast., Disp: 30 tablet, Rfl: 0 .  diazepam (VALIUM) 5 MG tablet, TAKE ONE TABLET BY MOUTH TWICE DAILY AS NEEDED FOR ANXIETY OR AGITATION, Disp: 30 tablet, Rfl: 0 .  gabapentin (NEURONTIN) 300 MG capsule, TAKE 1 CAPSULE BY MOUTH 3 TIMES DAILY (Patient taking differently: Take 300 mg by mouth 3 (three) times daily. ), Disp: 90 capsule, Rfl: 3 .  methocarbamol (ROBAXIN) 500 MG tablet, Take 1 tablet (500 mg total) by mouth every 6  (six) hours as needed for muscle spasms., Disp: 60 tablet, Rfl: 0 .  niacin 500 MG tablet, Take 500 mg by mouth daily., Disp: , Rfl:  .  oxyCODONE-acetaminophen (PERCOCET) 7.5-325 MG tablet, Take 1 tablet by mouth every 6 (six) hours as needed for severe pain., Disp: 50 tablet, Rfl: 0 .  oxyCODONE-acetaminophen (PERCOCET/ROXICET) 5-325 MG tablet, Take 1-2 tablets by mouth every 4 (four) hours as needed for severe pain. Post op pain, Disp: 30 tablet, Rfl: 0 .  sertraline (ZOLOFT) 100 MG tablet, TAKE ONE TABLET EVERY DAY AT BEDTIME (Patient taking differently: Take 100 mg by mouth at bedtime. ), Disp: 30 tablet, Rfl: 6  Review of Systems  Constitutional: Negative for fever.  HENT: Positive for congestion, postnasal drip, rhinorrhea, sinus pain and sore throat. Negative for ear pain.   Eyes: Negative for visual disturbance.  Respiratory: Negative for cough and wheezing.   Cardiovascular: Negative.   Neurological: Positive for headaches. Negative for dizziness and light-headedness.    Social History   Tobacco Use  . Smoking status: Current Every Day Smoker    Packs/day: 0.25    Years: 20.00    Pack years: 5.00    Types: Cigarettes  . Smokeless tobacco: Never Used  Substance Use Topics  . Alcohol use: Yes    Alcohol/week: 6.0 standard drinks    Types: 6 Cans of beer per week    Comment: none last hrs      Objective:   LMP  01/31/1999  There were no vitals filed for this visit.There is no height or weight on file to calculate BMI.   Physical Exam Vitals reviewed.  Constitutional:      General: She is not in acute distress. Pulmonary:     Effort: No respiratory distress.  Neurological:     Mental Status: She is alert.      No results found for any visits on 04/04/19.     Assessment & Plan     1. Acute non-recurrent pansinusitis Worsening symptoms that have not responded to OTC medications. Will give Zpak and prednisone as below. Continue allergy medications. Stay well  hydrated and get plenty of rest. Call if no symptom improvement or if symptoms worsen. - azithromycin (ZITHROMAX) 250 MG tablet; Take 2 tablets PO on day one, and one tablet PO daily thereafter until completed.  Dispense: 6 tablet; Refill: 0 - predniSONE (STERAPRED UNI-PAK 21 TAB) 10 MG (21) TBPK tablet; 6 day taper; take as directed on package instructions  Dispense: 21 tablet; Refill: 0  2. Cough Prednisone and Promethazine-DM for cough. Push fluids. Call if worsening.  - predniSONE (STERAPRED UNI-PAK 21 TAB) 10 MG (21) TBPK tablet; 6 day taper; take as directed on package instructions  Dispense: 21 tablet; Refill: 0 - promethazine-dextromethorphan (PROMETHAZINE-DM) 6.25-15 MG/5ML syrup; Take 5 mLs by mouth 3 (three) times daily as needed for cough.  Dispense: 118 mL; Refill: 0   I discussed the assessment and treatment plan with the patient. The patient was provided an opportunity to ask questions and all were answered. The patient agreed with the plan and demonstrated an understanding of the instructions.   The patient was advised to call back or seek an in-person evaluation if the symptoms worsen or if the condition fails to improve as anticipated.  I provided 7 minutes of non-face-to-face time during this encounter.    Mar Daring, PA-C  [facility]

## 2019-04-03 NOTE — Telephone Encounter (Signed)
Please Review

## 2019-04-03 NOTE — Telephone Encounter (Signed)
Advised patient, made appointment for phone visit for URI symptoms.  Patient is not happy about doing this type of visit.  Labs were ordered but I think she needs to probably be reminded that she can not come to the office to have these done due to her symtpoms.

## 2019-04-03 NOTE — Telephone Encounter (Signed)
Labs ordered and diazepam refilled.   Patient can have virtual or telephone for URI and cough if desired.

## 2019-04-04 ENCOUNTER — Ambulatory Visit: Payer: BC Managed Care – PPO | Admitting: Family Medicine

## 2019-04-04 ENCOUNTER — Ambulatory Visit (INDEPENDENT_AMBULATORY_CARE_PROVIDER_SITE_OTHER): Payer: BC Managed Care – PPO | Admitting: Physician Assistant

## 2019-04-04 ENCOUNTER — Encounter: Payer: Self-pay | Admitting: Physician Assistant

## 2019-04-04 DIAGNOSIS — J0140 Acute pansinusitis, unspecified: Secondary | ICD-10-CM | POA: Diagnosis not present

## 2019-04-04 DIAGNOSIS — R2689 Other abnormalities of gait and mobility: Secondary | ICD-10-CM | POA: Diagnosis not present

## 2019-04-04 DIAGNOSIS — R05 Cough: Secondary | ICD-10-CM | POA: Diagnosis not present

## 2019-04-04 DIAGNOSIS — R059 Cough, unspecified: Secondary | ICD-10-CM

## 2019-04-04 DIAGNOSIS — M25562 Pain in left knee: Secondary | ICD-10-CM | POA: Diagnosis not present

## 2019-04-04 DIAGNOSIS — Z96642 Presence of left artificial hip joint: Secondary | ICD-10-CM | POA: Diagnosis not present

## 2019-04-04 MED ORDER — AZITHROMYCIN 250 MG PO TABS: ORAL_TABLET | ORAL | 0 refills | Status: DC

## 2019-04-04 MED ORDER — PREDNISONE 10 MG (21) PO TBPK: ORAL_TABLET | ORAL | 0 refills | Status: DC

## 2019-04-04 MED ORDER — PROMETHAZINE-DM 6.25-15 MG/5ML PO SYRP: 5.0000 mL | ORAL_SOLUTION | Freq: Three times a day (TID) | ORAL | 0 refills | Status: DC | PRN

## 2019-04-08 DIAGNOSIS — Z96642 Presence of left artificial hip joint: Secondary | ICD-10-CM | POA: Diagnosis not present

## 2019-04-08 DIAGNOSIS — M25562 Pain in left knee: Secondary | ICD-10-CM | POA: Diagnosis not present

## 2019-04-08 DIAGNOSIS — R2689 Other abnormalities of gait and mobility: Secondary | ICD-10-CM | POA: Diagnosis not present

## 2019-04-09 ENCOUNTER — Other Ambulatory Visit: Payer: Self-pay

## 2019-04-09 ENCOUNTER — Ambulatory Visit (INDEPENDENT_AMBULATORY_CARE_PROVIDER_SITE_OTHER): Payer: BC Managed Care – PPO | Admitting: Orthopaedic Surgery

## 2019-04-09 DIAGNOSIS — M25562 Pain in left knee: Secondary | ICD-10-CM | POA: Diagnosis not present

## 2019-04-09 DIAGNOSIS — Z96652 Presence of left artificial knee joint: Secondary | ICD-10-CM

## 2019-04-09 DIAGNOSIS — R2689 Other abnormalities of gait and mobility: Secondary | ICD-10-CM | POA: Diagnosis not present

## 2019-04-09 DIAGNOSIS — Z96642 Presence of left artificial hip joint: Secondary | ICD-10-CM | POA: Diagnosis not present

## 2019-04-09 NOTE — Progress Notes (Signed)
Post-Op Visit Note   Patient: Emily Bender           Date of Birth: Feb 18, 1966           MRN: [id number] Visit Date: 04/09/2019 PCP: Margo Common, PA   Assessment & Plan: Post total knee arthroplasty left.  She is ambulating without a limp she still has some quad weakness and we went over additional exercises to work on this.  She has a little bit of a hop when she gets up with her left leg first on a step opposite right knee has no osteoarthritis symptoms.  She will continue to work on quad strengthening I will check her back if she has problems.  Chief Complaint:  Chief Complaint  Patient presents with  . Left Knee - Follow-up    02/26/2019 Left TKA Cemented   Visit Diagnoses:  1. S/P total knee arthroplasty, left     Plan: Continue quad strengthening.  Follow-up as needed she is very happy with the surgical result.  Follow-Up Instructions: No follow-ups on file.   Orders:  No orders of the defined types were placed in this encounter.  No orders of the defined types were placed in this encounter.   Imaging: No results found.  PMFS History: Patient Active Problem List   Diagnosis Date Noted  . S/P total knee arthroplasty, left 03/12/2019  . Arthritis of left knee 02/26/2019  . Wound infection 11/18/2018  . Lumbar stenosis 10/30/2018  . Herniated nucleus pulposus, lumbar 10/30/2018  . Spinal stenosis of lumbar region 10/16/2018  . Lumbar herniated disc 10/14/2018  . Alcohol drinker 05/20/2015  . Anxiety and depression 05/20/2015  . History of artificial joint 05/20/2015  . HLD (hyperlipidemia) 05/20/2015  . Avitaminosis D 05/20/2015  . Chronic cough 01/20/2014  . Adiposity 01/18/2012  . Irregular bleeding 11/27/2006  . Tobacco use 06/23/2005   Past Medical History:  Diagnosis Date  . Anxiety   . Arthritis   . Depression   . Hyperlipidemia   . Lumbar stenosis   . Sleep apnea    Waiting for Cpap    Family History  Problem Relation Age of Onset    . Lung cancer Father   . Healthy Brother   . Aneurysm Maternal Grandmother   . Breast cancer Maternal Grandmother        mat great gm  . Lung cancer Maternal Grandfather   . Coronary artery disease Paternal Grandmother   . Breast cancer Paternal Grandmother   . Diabetes Mother   . Hypertension Mother     Past Surgical History:  Procedure Laterality Date  . ANKLE SURGERY  01/17/2012   x3  . APPLICATION OF WOUND VAC N/A 11/18/2018   Procedure: Application Of Wound Vac;  Surgeon: Marybelle Killings, MD;  Location: [facility];  Service: Orthopedics;  Laterality: N/A;  . BACK SURGERY    . BREAST BIOPSY Left 1990's   neg  . CHOLECYSTECTOMY  2000  . COLONOSCOPY WITH PROPOFOL N/A 01/31/2019   Procedure: COLONOSCOPY WITH PROPOFOL;  Surgeon: Jonathon Bellows, MD;  Location: [facility];  Service: Gastroenterology;  Laterality: N/A;  . I & D EXTREMITY N/A 11/18/2018   Procedure: EVACUATION OF LUMBAR SEROMA/HEMATOMA;  Surgeon: Marybelle Killings, MD;  Location: [facility];  Service: Orthopedics;  Laterality: N/A;  . LAMINECTOMY  1996  . LUMBAR LAMINECTOMY N/A 10/30/2018   Procedure: left L3 & L4 hemilaminectomy, removal of herniated nucleus pulposus;  Surgeon: Marybelle Killings, MD;  Location:  [facility];  Service: Orthopedics;  Laterality: N/A;  . TOTAL KNEE ARTHROPLASTY Left 02/26/2019   Procedure: LEFT TOTAL KNEE ARTHROPLASTY-CEMENTED;  Surgeon: Marybelle Killings, MD;  Location: [facility];  Service: Orthopedics;  Laterality: Left;  . WRIST SURGERY Right    Social History   Occupational History  . Not on file  Tobacco Use  . Smoking status: Current Every Day Smoker    Packs/day: 0.25    Years: 20.00    Pack years: 5.00    Types: Cigarettes  . Smokeless tobacco: Never Used  Substance and Sexual Activity  . Alcohol use: Yes    Alcohol/week: 6.0 standard drinks    Types: 6 Cans of beer per week    Comment: none last hrs  . Drug use: Yes    Frequency: 7.0 times per week    Types: Marijuana  . Sexual activity: Not on file

## 2019-04-10 DIAGNOSIS — Z96642 Presence of left artificial hip joint: Secondary | ICD-10-CM | POA: Diagnosis not present

## 2019-04-10 DIAGNOSIS — R2689 Other abnormalities of gait and mobility: Secondary | ICD-10-CM | POA: Diagnosis not present

## 2019-04-10 DIAGNOSIS — M25562 Pain in left knee: Secondary | ICD-10-CM | POA: Diagnosis not present

## 2019-04-14 DIAGNOSIS — Z96642 Presence of left artificial hip joint: Secondary | ICD-10-CM | POA: Diagnosis not present

## 2019-04-14 DIAGNOSIS — M25562 Pain in left knee: Secondary | ICD-10-CM | POA: Diagnosis not present

## 2019-04-14 DIAGNOSIS — R2689 Other abnormalities of gait and mobility: Secondary | ICD-10-CM | POA: Diagnosis not present

## 2019-04-16 DIAGNOSIS — R2689 Other abnormalities of gait and mobility: Secondary | ICD-10-CM | POA: Diagnosis not present

## 2019-04-16 DIAGNOSIS — M25562 Pain in left knee: Secondary | ICD-10-CM | POA: Diagnosis not present

## 2019-04-16 DIAGNOSIS — Z96642 Presence of left artificial hip joint: Secondary | ICD-10-CM | POA: Diagnosis not present

## 2019-04-17 DIAGNOSIS — Z96642 Presence of left artificial hip joint: Secondary | ICD-10-CM | POA: Diagnosis not present

## 2019-04-17 DIAGNOSIS — M25562 Pain in left knee: Secondary | ICD-10-CM | POA: Diagnosis not present

## 2019-04-17 DIAGNOSIS — R2689 Other abnormalities of gait and mobility: Secondary | ICD-10-CM | POA: Diagnosis not present

## 2019-04-29 ENCOUNTER — Other Ambulatory Visit: Payer: Self-pay | Admitting: Family Medicine

## 2019-04-29 DIAGNOSIS — M5416 Radiculopathy, lumbar region: Secondary | ICD-10-CM

## 2019-05-04 DIAGNOSIS — G4733 Obstructive sleep apnea (adult) (pediatric): Secondary | ICD-10-CM | POA: Diagnosis not present

## 2019-06-04 DIAGNOSIS — G4733 Obstructive sleep apnea (adult) (pediatric): Secondary | ICD-10-CM | POA: Diagnosis not present

## 2019-07-02 DIAGNOSIS — G4733 Obstructive sleep apnea (adult) (pediatric): Secondary | ICD-10-CM | POA: Diagnosis not present

## 2019-07-03 ENCOUNTER — Other Ambulatory Visit: Payer: Self-pay | Admitting: Family Medicine

## 2019-07-03 DIAGNOSIS — F32A Depression, unspecified: Secondary | ICD-10-CM

## 2019-07-03 DIAGNOSIS — F329 Major depressive disorder, single episode, unspecified: Secondary | ICD-10-CM

## 2019-08-02 ENCOUNTER — Other Ambulatory Visit: Payer: Self-pay | Admitting: Family Medicine

## 2019-08-02 DIAGNOSIS — G4733 Obstructive sleep apnea (adult) (pediatric): Secondary | ICD-10-CM | POA: Diagnosis not present

## 2019-08-02 NOTE — Telephone Encounter (Signed)
Requested medication (s) are due for refill today: yes  Requested medication (s) are on the active medication list: not indicated  Last refill:  04/09/19  Future visit scheduled: no  Notes to clinic:  Historical medication and provider   Requested Prescriptions  Pending Prescriptions Disp Refills   meloxicam (MOBIC) 7.5 MG tablet [Pharmacy Med Name: MELOXICAM 7.5 MG TAB] 60 tablet     Sig: TAKE ONE TABLET TWICE DAILY      Analgesics:  COX2 Inhibitors Failed - 08/02/2019 10:12 AM      Failed - HGB in normal range and within 360 days    Hemoglobin  Date Value Ref Range Status  02/28/2019 11.6 (L) 12.0 - 15.0 g/dL Final  01/10/2019 13.9 11.1 - 15.9 g/dL Final          Passed - Cr in normal range and within 360 days    Creatinine, Ser  Date Value Ref Range Status  02/27/2019 0.58 0.44 - 1.00 mg/dL Final          Passed - Patient is not pregnant      Passed - Valid encounter within last 12 months    Recent Outpatient Visits           4 months ago Acute non-recurrent pansinusitis   [facility] [place], Clearnce Sorrel, PA-C   6 months ago Annual physical exam   [facility], Vickki Muff, [place]   1 year ago Anxiety and depression   [facility], [place] Muff, [place]   3 years ago Mixed hyperlipidemia   [facility], [place]uff, [place]   3 years ago Annual physical exam   [facility], [place], [place]

## 2019-08-25 ENCOUNTER — Telehealth: Payer: Self-pay | Admitting: Orthopaedic Surgery

## 2019-08-25 NOTE — Telephone Encounter (Signed)
Tried again, No answer, no VM to LM

## 2019-08-25 NOTE — Telephone Encounter (Signed)
Pls advise. Thanks.  

## 2019-08-25 NOTE — Telephone Encounter (Signed)
Tried calling no answer. Will try to call again later.

## 2019-08-25 NOTE — Telephone Encounter (Signed)
Patient called stating that she has a dental appointment on June 11th and wanted to know if Dr. Lorin Mercy requires pre med with antibiotics.  Patient has also requested that a note be faxed to her dentist (Dr. Thereasa Parkin) stating whether or not she needs antibiotics before her appointment.  CB#[phone].  Thank you.

## 2019-08-25 NOTE — Telephone Encounter (Signed)
Fax number for Dr Thereasa Parkin is [phone].  Thank you.

## 2019-08-25 NOTE — Telephone Encounter (Signed)
Don't need it

## 2019-08-29 ENCOUNTER — Other Ambulatory Visit: Payer: Self-pay | Admitting: Family Medicine

## 2019-08-29 DIAGNOSIS — M5416 Radiculopathy, lumbar region: Secondary | ICD-10-CM

## 2019-09-01 DIAGNOSIS — G4733 Obstructive sleep apnea (adult) (pediatric): Secondary | ICD-10-CM | POA: Diagnosis not present

## 2019-09-05 ENCOUNTER — Other Ambulatory Visit: Payer: Self-pay | Admitting: Family Medicine

## 2019-09-05 NOTE — Telephone Encounter (Signed)
Requested medication (s) are due for refill today -yes  Requested medication (s) are on the active medication list -yes  Future visit scheduled -no  Last refill: 08/04/19  Notes to clinic: Patient had RF last month with no additional- fails lab protocol- sent for review of request  Requested Prescriptions  Pending Prescriptions Disp Refills   meloxicam (MOBIC) 7.5 MG tablet [Pharmacy Med Name: MELOXICAM 7.5 MG TAB] 60 tablet 0    Sig: TAKE ONE TABLET TWICE DAILY      Analgesics:  COX2 Inhibitors Failed - 09/05/2019  4:17 PM      Failed - HGB in normal range and within 360 days    Hemoglobin  Date Value Ref Range Status  02/28/2019 11.6 (L) 12.0 - 15.0 g/dL Final  01/10/2019 13.9 11.1 - 15.9 g/dL Final          Passed - Cr in normal range and within 360 days    Creatinine, Ser  Date Value Ref Range Status  02/27/2019 0.58 0.44 - 1.00 mg/dL Final          Passed - Patient is not pregnant      Passed - Valid encounter within last 12 months    Recent Outpatient Visits           5 months ago Acute non-recurrent pansinusitis   [facility] [place], Anderson Malta M, PA-C   7 months ago Annual physical exam   [facility], [place], [place]   1 year ago Anxiety and depression   [facility], [place], [place]   3 years ago Mixed hyperlipidemia   [facility], [place], [place]   3 years ago Annual physical exam   [place], [place]                  Requested Prescriptions  Pending Prescriptions Disp Refills   meloxicam ([place]) 7.5 MG tablet [Pharmacy Med Name: MELOXICAM 7.5 MG TAB] 60 tablet 0    Sig: TAKE ONE TABLET TWICE DAILY      Analgesics:  COX2 Inhibitors Failed - 09/05/2019  4:17 PM      Failed - HGB in normal range and within 360 days    Hemoglobin  Date Value Ref Range Status  02/28/2019 11.6 (L) 12.0 - 15.0 g/dL Final  01/10/2019 13.9 11.1 - 15.9 g/dL  Final          Passed - Cr in normal range and within 360 days    Creatinine, Ser  Date Value Ref Range Status  02/27/2019 0.58 0.44 - 1.00 mg/dL Final          Passed - Patient is not pregnant      Passed - Valid encounter within last 12 months    Recent Outpatient Visits           5 months ago Acute non-recurrent pansinusitis   [facility], Clearnce Sorrel, PA-C   7 months ago Annual physical exam   [facility], Vickki Muff, [place]   1 year ago Anxiety and depression   [facility], [place] Muff, [place]   3 years ago Mixed hyperlipidemia   [facility], [place]ickki Muff, [place]   3 years ago Annual physical exam   [facility], [place], [place]

## 2019-09-23 ENCOUNTER — Telehealth: Payer: Self-pay | Admitting: Orthopaedic Surgery

## 2019-09-23 NOTE — Telephone Encounter (Signed)
I called patient and advised she does not need pre med prior to dental appointment. She requests note be faxed to dentist, however, does not have fax number on her. She will call back with fax number. Note printed at my desk to fax when available.

## 2019-09-23 NOTE — Telephone Encounter (Signed)
Patient called requesting antibiotics for dentist appt on 09/26/19.Patient is having procedure and need medication. Please send to pharmacy on file. [facility]. Patient phone number is [phone]. Please call patient when medication is sent in.

## 2019-09-25 ENCOUNTER — Telehealth: Payer: Self-pay | Admitting: Orthopaedic Surgery

## 2019-09-25 NOTE — Telephone Encounter (Signed)
Patient called. She has a Pharmacist, community appointment tomorrow. She would like a note faxed to [phone] stating that no medication is needed.

## 2019-09-25 NOTE — Telephone Encounter (Signed)
faxed

## 2019-10-02 DIAGNOSIS — G4733 Obstructive sleep apnea (adult) (pediatric): Secondary | ICD-10-CM | POA: Diagnosis not present

## 2019-10-03 ENCOUNTER — Ambulatory Visit
Admission: EM | Admit: 2019-10-03 | Discharge: 2019-10-03 | Disposition: A | Discharge status: Home / Self Care | Payer: Self-pay | Attending: Emergency Medicine | Admitting: Emergency Medicine

## 2019-10-03 ENCOUNTER — Other Ambulatory Visit: Payer: Self-pay

## 2019-10-03 ENCOUNTER — Encounter: Payer: Self-pay | Admitting: Emergency Medicine

## 2019-10-03 DIAGNOSIS — J22 Unspecified acute lower respiratory infection: Secondary | ICD-10-CM | POA: Diagnosis not present

## 2019-10-03 LAB — POC SARS CORONAVIRUS 2 AG -  ED: SARS Coronavirus 2 Ag: NEGATIVE

## 2019-10-03 MED ORDER — AZITHROMYCIN 250 MG PO TABS: 250.0000 mg | ORAL_TABLET | Freq: Every day | ORAL | 0 refills | Status: DC

## 2019-10-03 MED ORDER — ALBUTEROL SULFATE (2.5 MG/3ML) 0.083% IN NEBU: 2.5000 mg | INHALATION_SOLUTION | Freq: Four times a day (QID) | RESPIRATORY_TRACT | 12 refills | Status: DC | PRN

## 2019-10-03 MED ORDER — ALBUTEROL SULFATE HFA 108 (90 BASE) MCG/ACT IN AERS: 2.0000 | INHALATION_SPRAY | RESPIRATORY_TRACT | 0 refills | Status: DC | PRN

## 2019-10-03 MED ORDER — PREDNISONE 10 MG (21) PO TBPK: ORAL_TABLET | Freq: Every day | ORAL | 0 refills | Status: DC

## 2019-10-03 NOTE — Discharge Instructions (Signed)
Use the albuterol as directed.  Take the prednisone and Zithromax as directed.    Follow up with your primary care provider for a recheck in 1 week.    Your rapid COVID test is negative; the send-out test is pending.  You should self quarantine until your test result is back and is negative.    Go to the emergency department if you develop acute shortness of breath or other concerning symptoms.

## 2019-10-03 NOTE — ED Provider Notes (Signed)
Roderic Palau    CSN: [id number] Arrival date & time: 10/03/19  1015      History   Chief Complaint Chief Complaint  Patient presents with  . Sinus Problem  . Shortness of Breath    HPI Emily Bender is a 54 y.o. female.   Patient presents with nonproductive cough, shortness of breath, sinus pressure, nasal congestion, postnasal drip, wheezing x2 weeks.  Current smoker.  She denies fever, chills, sore throat, abdominal pain, vomiting, diarrhea, rash, or other symptoms.  Treatment attempted at home with OTC cold medication.  Patient denies history of COPD or other lung disease.  The history is provided by the patient.    Past Medical History:  Diagnosis Date  . Anxiety   . Arthritis   . Depression   . Hyperlipidemia   . Lumbar stenosis   . Sleep apnea    Waiting for Cpap    Patient Active Problem List   Diagnosis Date Noted  . S/P total knee arthroplasty, left 03/12/2019  . Arthritis of left knee 02/26/2019  . Wound infection 11/18/2018  . Lumbar stenosis 10/30/2018  . Herniated nucleus pulposus, lumbar 10/30/2018  . Spinal stenosis of lumbar region 10/16/2018  . Lumbar herniated disc 10/14/2018  . Alcohol drinker 05/20/2015  . Anxiety and depression 05/20/2015  . History of artificial joint 05/20/2015  . HLD (hyperlipidemia) 05/20/2015  . Avitaminosis D 05/20/2015  . Chronic cough 01/20/2014  . Adiposity 01/18/2012  . Irregular bleeding 11/27/2006  . Tobacco use 06/23/2005    Past Surgical History:  Procedure Laterality Date  . ANKLE SURGERY  01/17/2012   x3  . APPLICATION OF WOUND VAC N/A 11/18/2018   Procedure: Application Of Wound Vac;  Surgeon: Marybelle Killings, MD;  Location: [facility];  Service: Orthopedics;  Laterality: N/A;  . BACK SURGERY    . BREAST BIOPSY Left 1990's   neg  . CHOLECYSTECTOMY  2000  . COLONOSCOPY WITH PROPOFOL N/A 01/31/2019   Procedure: COLONOSCOPY WITH PROPOFOL;  Surgeon: Jonathon Bellows, MD;  Location: [facility] ENDOSCOPY;   Service: Gastroenterology;  Laterality: N/A;  . I & D EXTREMITY N/A 11/18/2018   Procedure: EVACUATION OF LUMBAR SEROMA/HEMATOMA;  Surgeon: Marybelle Killings, MD;  Location: [facility];  Service: Orthopedics;  Laterality: N/A;  . LAMINECTOMY  1996  . LUMBAR LAMINECTOMY N/A 10/30/2018   Procedure: left L3 & L4 hemilaminectomy, removal of herniated nucleus pulposus;  Surgeon: Marybelle Killings, MD;  Location: [facility];  Service: Orthopedics;  Laterality: N/A;  . TOTAL KNEE ARTHROPLASTY Left 02/26/2019   Procedure: LEFT TOTAL KNEE ARTHROPLASTY-CEMENTED;  Surgeon: Marybelle Killings, MD;  Location: [facility];  Service: Orthopedics;  Laterality: Left;  . WRIST SURGERY Right     OB History   No obstetric history on file.      Home Medications    Prior to Admission medications   Medication Sig Start Date End Date Taking? Authorizing Provider  albuterol (PROVENTIL) (2.5 MG/3ML) 0.083% nebulizer solution Take 3 mLs (2.5 mg total) by nebulization every 6 (six) hours as needed for wheezing or shortness of breath. 10/03/19   Sharion Balloon, NP  albuterol (VENTOLIN HFA) 108 (90 Base) MCG/ACT inhaler Inhale 2 puffs into the lungs every 4 (four) hours as needed for wheezing or shortness of breath. 10/03/19   Sharion Balloon, NP  aspirin EC 325 MG EC tablet Take 1 tablet (325 mg total) by mouth daily with breakfast. 03/01/19   Lanae Crumbly, PA-C  azithromycin (  ZITHROMAX) 250 MG tablet Take 1 tablet (250 mg total) by mouth daily. Take first 2 tablets together, then 1 every day until finished. 10/03/19   Sharion Balloon, NP  diazepam (VALIUM) 5 MG tablet TAKE ONE TABLET BY MOUTH TWICE DAILY AS NEEDED FOR ANXIETY OR AGITATION 04/03/19   Mar Daring, PA-C  gabapentin (NEURONTIN) 300 MG capsule TAKE 1 CAPSULE BY MOUTH THREE TIMES DAILY 08/29/19   Chrismon, Vickki Muff, PA  meloxicam (MOBIC) 7.5 MG tablet TAKE ONE TABLET TWICE DAILY 09/08/19   Chrismon, Vickki Muff, PA  methocarbamol (ROBAXIN) 500 MG tablet Take 1 tablet (500 mg total) by  mouth every 6 (six) hours as needed for muscle spasms. Patient not taking: Reported on 04/09/2019 02/28/19   Lanae Crumbly, PA-C  niacin 500 MG tablet Take 500 mg by mouth daily.    [provider]  oxyCODONE-acetaminophen (PERCOCET) 7.5-325 MG tablet Take 1 tablet by mouth every 6 (six) hours as needed for severe pain. Patient not taking: Reported on 04/09/2019 02/28/19   Lanae Crumbly, PA-C  oxyCODONE-acetaminophen (PERCOCET/ROXICET) 5-325 MG tablet Take 1-2 tablets by mouth every 4 (four) hours as needed for severe pain. Post op pain Patient not taking: Reported on 04/09/2019 03/12/19   Marybelle Killings, MD  predniSONE (STERAPRED UNI-PAK 21 TAB) 10 MG (21) TBPK tablet Take by mouth daily. Take 6 tabs by mouth daily  for 1 day, then 5 tabs for 1 day, then 4 tabs for 1 day, then 3 tabs for 1 day, 2 tabs for 1 day, then 1 tab by mouth daily for 1 day 10/03/19   Sharion Balloon, NP  promethazine-dextromethorphan (PROMETHAZINE-DM) 6.25-15 MG/5ML syrup Take 5 mLs by mouth 3 (three) times daily as needed for cough. 04/04/19   Mar Daring, PA-C  sertraline (ZOLOFT) 100 MG tablet TAKE ONE TABLET AT BEDTIME 07/03/19   Chrismon, Vickki Muff, PA    Family History Family History  Problem Relation Age of Onset  . Lung cancer Father   . Healthy Brother   . Aneurysm Maternal Grandmother   . Breast cancer Maternal Grandmother        mat great gm  . Lung cancer Maternal Grandfather   . Coronary artery disease Paternal Grandmother   . Breast cancer Paternal Grandmother   . Diabetes Mother   . Hypertension Mother     Social History Social History   Tobacco Use  . Smoking status: Current Every Day Smoker    Packs/day: 0.25    Years: 20.00    Pack years: 5.00    Types: Cigarettes  . Smokeless tobacco: Never Used  Vaping Use  . Vaping Use: Never used  Substance Use Topics  . Alcohol use: Yes    Alcohol/week: 6.0 standard drinks    Types: 6 Cans of beer per week    Comment: none last  hrs  . Drug use: Yes    Frequency: 7.0 times per week    Types: Marijuana     Allergies   Sulfa antibiotics   Review of Systems Review of Systems  Constitutional: Negative for chills and fever.  HENT: Positive for congestion, postnasal drip, rhinorrhea and sinus pressure. Negative for ear pain and sore throat.   Eyes: Negative for pain and visual disturbance.  Respiratory: Positive for cough and shortness of breath.   Cardiovascular: Negative for chest pain and palpitations.  Gastrointestinal: Negative for abdominal pain, diarrhea, nausea and vomiting.  Genitourinary: Negative for dysuria and hematuria.  Musculoskeletal:  Negative for arthralgias and back pain.  Skin: Negative for color change and rash.  Neurological: Negative for seizures and syncope.  All other systems reviewed and are negative.    Physical Exam Triage Vital Signs ED Triage Vitals  Enc Vitals Group     BP      Pulse      Resp      Temp      Temp src      SpO2      Weight      Height      Head Circumference      Peak Flow      Pain Score      Pain Loc      Pain Edu?      Excl. in [place]?    No data found.  Updated Vital Signs BP (!) 155/107 (BP Location: Right Arm)   Pulse 86   Temp 98.2 F (36.8 C) (Oral)   Resp 20   LMP 01/31/1999   SpO2 93% Comment: provider notified  Visual Acuity Right Eye Distance:   Left Eye Distance:   Bilateral Distance:    Right Eye Near:   Left Eye Near:    Bilateral Near:     Physical Exam Vitals and nursing note reviewed.  Constitutional:      General: She is not in acute distress.    Appearance: She is well-developed.  HENT:     Head: Normocephalic and atraumatic.     Right Ear: Tympanic membrane normal.     Left Ear: Tympanic membrane normal.     Nose: Rhinorrhea present.     Mouth/Throat:     Mouth: Mucous membranes are moist.     Pharynx: Oropharynx is clear.  Eyes:     Conjunctiva/sclera: Conjunctivae normal.  Cardiovascular:     Rate and  Rhythm: Normal rate and regular rhythm.     Heart sounds: No murmur heard.   Pulmonary:     Effort: Pulmonary effort is normal. No respiratory distress.     Breath sounds: Wheezing and rhonchi present.     Comments: Scattered rhonchi and wheezes throughout. Abdominal:     Palpations: Abdomen is soft.     Tenderness: There is no abdominal tenderness. There is no guarding or rebound.  Musculoskeletal:     Cervical back: Neck supple.  Skin:    General: Skin is warm and dry.     Findings: No rash.  Neurological:     General: No focal deficit present.     Mental Status: She is alert and oriented to person, place, and time.     Gait: Gait normal.  Psychiatric:        Mood and Affect: Mood normal.        Behavior: Behavior normal.      UC Treatments / Results  Labs (all labs ordered are listed, but only abnormal results are displayed) Labs Reviewed  NOVEL CORONAVIRUS, NAA  POC SARS CORONAVIRUS 2 AG -  ED    EKG   Radiology No results found.  Procedures Procedures (including critical care time)  Medications Ordered in UC Medications - No data to display  Initial Impression / Assessment and Plan / UC Course  I have reviewed the triage vital signs and the nursing notes.  Pertinent labs & imaging results that were available during my care of the patient were reviewed by me and considered in my medical decision making (see chart for details).   Lower respiratory infection.  Patient refuses chest x-ray or transfer to the ED.  Treating with albuterol, prednisone, Zithromax.  POC COVID negative; PCR pending.  Instructed patient to schedule follow-up visit with her PCP in 1 week.  Instructed her to go to the ED if she has acute shortness of breath or other concerning symptoms.  Patient agrees to plan of care.    Final Clinical Impressions(s) / UC Diagnoses   Final diagnoses:  Lower respiratory infection     Discharge Instructions     Use the albuterol as directed.   Take the prednisone and Zithromax as directed.    Follow up with your primary care provider for a recheck in 1 week.    Your rapid COVID test is negative; the send-out test is pending.  You should self quarantine until your test result is back and is negative.    Go to the emergency department if you develop acute shortness of breath or other concerning symptoms.        ED Prescriptions    Medication Sig Dispense Auth. Provider   albuterol (PROVENTIL) (2.5 MG/3ML) 0.083% nebulizer solution Take 3 mLs (2.5 mg total) by nebulization every 6 (six) hours as needed for wheezing or shortness of breath. 75 mL Sharion Balloon, NP   albuterol (VENTOLIN HFA) 108 (90 Base) MCG/ACT inhaler Inhale 2 puffs into the lungs every 4 (four) hours as needed for wheezing or shortness of breath. 18 g Sharion Balloon, NP   predniSONE (STERAPRED UNI-PAK 21 TAB) 10 MG (21) TBPK tablet Take by mouth daily. Take 6 tabs by mouth daily  for 1 day, then 5 tabs for 1 day, then 4 tabs for 1 day, then 3 tabs for 1 day, 2 tabs for 1 day, then 1 tab by mouth daily for 1 day 21 tablet Sharion Balloon, NP   azithromycin (ZITHROMAX) 250 MG tablet Take 1 tablet (250 mg total) by mouth daily. Take first 2 tablets together, then 1 every day until finished. 6 tablet Sharion Balloon, NP     PDMP not reviewed this encounter.   Sharion Balloon, NP 10/03/19 1058

## 2019-10-03 NOTE — ED Triage Notes (Signed)
Pt c/o sob,  Sinus pressure and mucous drainage x 2 weeks. Pt states she [place] to the beach and her daughter had similar symptoms. Pt states she has been taking OTC cold medication with no relief of symptoms. Pt is wheezing during triage.

## 2019-10-04 LAB — NOVEL CORONAVIRUS, NAA: SARS-CoV-2, NAA: NOT DETECTED

## 2019-10-04 LAB — SARS-COV-2, NAA 2 DAY TAT

## 2019-10-06 ENCOUNTER — Other Ambulatory Visit: Payer: Self-pay | Admitting: Family Medicine

## 2019-10-06 NOTE — Telephone Encounter (Signed)
Requested Prescriptions  Pending Prescriptions Disp Refills   meloxicam (MOBIC) 7.5 MG tablet [Pharmacy Med Name: MELOXICAM 7.5 MG TAB] 60 tablet 0    Sig: TAKE 1 TABLET BY MOUTH TWICE DAILY     Analgesics:  COX2 Inhibitors Failed - 10/06/2019  3:21 PM      Failed - HGB in normal range and within 360 days    Hemoglobin  Date Value Ref Range Status  02/28/2019 11.6 (L) 12.0 - 15.0 g/dL Final  01/10/2019 13.9 11.1 - 15.9 g/dL Final         Passed - Cr in normal range and within 360 days    Creatinine, Ser  Date Value Ref Range Status  02/27/2019 0.58 0.44 - 1.00 mg/dL Final         Passed - Patient is not pregnant      Passed - Valid encounter within last 12 months    Recent Outpatient Visits          6 months ago Acute non-recurrent pansinusitis   [facility] Mar Daring, [place]   9 months ago Annual physical exam   [place], [place]   1 year ago Anxiety and depression   [facility], Vickki Muff, [place]   3 years ago Mixed hyperlipidemia   [facility], Vickki Muff, [place]   3 years ago Annual physical exam   [facility], [place], [place]

## 2019-11-06 ENCOUNTER — Other Ambulatory Visit: Payer: Self-pay | Admitting: Family Medicine

## 2019-12-02 ENCOUNTER — Other Ambulatory Visit: Payer: Self-pay | Admitting: Orthopaedic Surgery

## 2019-12-02 DIAGNOSIS — M4807 Spinal stenosis, lumbosacral region: Secondary | ICD-10-CM

## 2019-12-18 ENCOUNTER — Other Ambulatory Visit: Payer: Self-pay | Admitting: Family Medicine

## 2019-12-18 DIAGNOSIS — M5416 Radiculopathy, lumbar region: Secondary | ICD-10-CM

## 2019-12-18 NOTE — Telephone Encounter (Signed)
Requested medication (s) are due for refill today:yes  Requested medication (s) are on the active medication list:yes  Last refill:  11/05/19  #60  0 refills  Future visit scheduled: No  Notes to clinic:  last HGB was 02/28/19  11.6 low    Requested Prescriptions  Pending Prescriptions Disp Refills   meloxicam (MOBIC) 7.5 MG tablet [Pharmacy Med Name: MELOXICAM 7.5 MG TAB] 60 tablet 0    Sig: TAKE 1 TABLET BY MOUTH TWICE DAILY      Analgesics:  COX2 Inhibitors Failed - 12/18/2019  4:22 PM      Failed - HGB in normal range and within 360 days    Hemoglobin  Date Value Ref Range Status  02/28/2019 11.6 (L) 12.0 - 15.0 g/dL Final  01/10/2019 13.9 11.1 - 15.9 g/dL Final          Passed - Cr in normal range and within 360 days    Creatinine, Ser  Date Value Ref Range Status  02/27/2019 0.58 0.44 - 1.00 mg/dL Final          Passed - Patient is not pregnant      Passed - Valid encounter within last 12 months    Recent Outpatient Visits           8 months ago Acute non-recurrent pansinusitis   [facility] Fenton Malling M, [place]   11 months ago Annual physical exam   [facility], Vickki Muff, [place]   1 year ago Anxiety and depression   [facility], Vickki Muff, [place]   3 years ago Mixed hyperlipidemia   [facility], Vickki Muff, [place]   3 years ago Annual physical exam   [place], [place]               Signed Prescriptions Disp Refills   gabapentin (NEURONTIN) 300 MG capsule 270 capsule 1    Sig: TAKE 1 CAPSULE BY MOUTH THREE TIMES DAILY      Neurology: Anticonvulsants - gabapentin Passed - 12/18/2019  4:22 PM      Passed - Valid encounter within last 12 months    Recent Outpatient Visits           8 months ago Acute non-recurrent pansinusitis   [facility] Fenton Malling M, [place]   11 months ago Annual physical exam   [facility], Vickki Muff, [place]   1 year ago Anxiety and depression   [facility], Vickki Muff, [place]   3 years ago Mixed hyperlipidemia   [facility], Vickki Muff, [place]   3 years ago Annual physical exam   [facility], [place], [place]

## 2019-12-18 NOTE — Telephone Encounter (Signed)
Requested Prescriptions  Pending Prescriptions Disp Refills   meloxicam (MOBIC) 7.5 MG tablet [Pharmacy Med Name: MELOXICAM 7.5 MG TAB] 60 tablet 0    Sig: TAKE 1 TABLET BY MOUTH TWICE DAILY     Analgesics:  COX2 Inhibitors Failed - 12/18/2019  4:22 PM      Failed - HGB in normal range and within 360 days    Hemoglobin  Date Value Ref Range Status  02/28/2019 11.6 (L) 12.0 - 15.0 g/dL Final  01/10/2019 13.9 11.1 - 15.9 g/dL Final         Passed - Cr in normal range and within 360 days    Creatinine, Ser  Date Value Ref Range Status  02/27/2019 0.58 0.44 - 1.00 mg/dL Final         Passed - Patient is not pregnant      Passed - Valid encounter within last 12 months    Recent Outpatient Visits          8 months ago Acute non-recurrent pansinusitis   [facility] Fenton Malling M, [place]   11 months ago Annual physical exam   [facility], Vickki Muff, [place]   1 year ago Anxiety and depression   [facility], Vickki Muff, [place]   3 years ago Mixed hyperlipidemia   [facility], Vickki Muff, [place]   3 years ago Annual physical exam   [facility], [place], [place]              gabapentin (NEURONTIN) 300 MG capsule [Pharmacy Med Name: GABAPENTIN 300 MG CAP] 270 capsule 1    Sig: TAKE 1 CAPSULE BY MOUTH [place]     Neurology: Anticonvulsants - gabapentin Passed - 12/18/2019  4:22 PM      Passed - Valid encounter within last 12 months    Recent Outpatient Visits          8 months ago Acute non-recurrent pansinusitis   [facility] Fenton Malling M, [place]   11 months ago Annual physical exam   [facility], Vickki Muff, [place]   1 year ago Anxiety and depression   [facility], Vickki Muff, [place]   3 years ago Mixed hyperlipidemia   [facility], Vickki Muff, [place]   3 years ago Annual physical exam    [facility], [place], [place]

## 2020-01-14 ENCOUNTER — Other Ambulatory Visit: Payer: Self-pay | Admitting: Family Medicine

## 2020-01-14 DIAGNOSIS — F32A Depression, unspecified: Secondary | ICD-10-CM

## 2020-01-14 DIAGNOSIS — F419 Anxiety disorder, unspecified: Secondary | ICD-10-CM

## 2020-01-14 NOTE — Telephone Encounter (Signed)
Requested medication (s) are due for refill today - yes  Requested medication (s) are on the active medication list -yes  Future visit scheduled -no  Last refill: 12/09/19  Notes to clinic: Call to patient to schedule medication follow up- this is a long term medication for patient and she is not having any problems- she request RF without appointment- she will schedule if she has too- but would prefer to not spend the money if she doesn't have too. Will send for review of request.  Requested Prescriptions  Pending Prescriptions Disp Refills   sertraline (ZOLOFT) 100 MG tablet [Pharmacy Med Name: SERTRALINE HCL 100 MG TAB] 30 tablet 6    Sig: TAKE ONE TABLET AT BEDTIME      Psychiatry:  Antidepressants - SSRI Failed - 01/14/2020  8:06 AM      Failed - Completed PHQ-2 or PHQ-9 in the last 360 days.      Failed - Valid encounter within last 6 months    Recent Outpatient Visits           9 months ago Acute non-recurrent pansinusitis   [facility] [place], Clearnce Sorrel, [place]   1 year ago Annual physical exam   [place], [place]   1 year ago Anxiety and depression   [facility], Vickki Muff, [place]   3 years ago Mixed hyperlipidemia   [facility], Vickki Muff, [place]   3 years ago Annual physical exam   [place], [place]                  Requested Prescriptions  Pending Prescriptions Disp Refills   sertraline (ZOLOFT) 100 MG tablet [Pharmacy Med Name: SERTRALINE HCL 100 MG TAB] 30 tablet 6    Sig: TAKE ONE TABLET AT BEDTIME      Psychiatry:  Antidepressants - SSRI Failed - 01/14/2020  8:06 AM      Failed - Completed PHQ-2 or PHQ-9 in the last 360 days.      Failed - Valid encounter within last 6 months    Recent Outpatient Visits           9 months ago Acute non-recurrent pansinusitis   [facility] [place], Clearnce Sorrel, [place]   1 year ago  Annual physical exam   [place], Vickki Muff, [place]   1 year ago Anxiety and depression   [facility], Vickki Muff, [place]   3 years ago Mixed hyperlipidemia   [facility], Vickki Muff, [place]   3 years ago Annual physical exam   [place], [place]

## 2020-01-16 ENCOUNTER — Encounter: Payer: Self-pay | Admitting: Family Medicine

## 2020-01-16 ENCOUNTER — Ambulatory Visit (INDEPENDENT_AMBULATORY_CARE_PROVIDER_SITE_OTHER): Payer: BC Managed Care – PPO | Admitting: Family Medicine

## 2020-01-16 ENCOUNTER — Other Ambulatory Visit: Payer: Self-pay

## 2020-01-16 VITALS — BP 160/87 | HR 74 | Temp 98.1°F | Resp 16 | Wt 247.8 lb

## 2020-01-16 DIAGNOSIS — R030 Elevated blood-pressure reading, without diagnosis of hypertension: Secondary | ICD-10-CM

## 2020-01-16 DIAGNOSIS — F419 Anxiety disorder, unspecified: Secondary | ICD-10-CM | POA: Diagnosis not present

## 2020-01-16 DIAGNOSIS — J4520 Mild intermittent asthma, uncomplicated: Secondary | ICD-10-CM

## 2020-01-16 DIAGNOSIS — Z720 Tobacco use: Secondary | ICD-10-CM | POA: Diagnosis not present

## 2020-01-16 DIAGNOSIS — F32A Depression, unspecified: Secondary | ICD-10-CM

## 2020-01-16 DIAGNOSIS — E782 Mixed hyperlipidemia: Secondary | ICD-10-CM

## 2020-01-16 MED ORDER — ALBUTEROL SULFATE HFA 108 (90 BASE) MCG/ACT IN AERS: 2.0000 | INHALATION_SPRAY | RESPIRATORY_TRACT | 0 refills | Status: AC | PRN

## 2020-01-16 MED ORDER — DIAZEPAM 5 MG PO TABS: ORAL_TABLET | ORAL | 0 refills | Status: DC

## 2020-01-16 NOTE — Progress Notes (Signed)
Established patient visit   Patient: Emily Bender   DOB: 04/20/1965   54 y.o. Female  MRN: [id number] Visit Date: 01/16/2020  Today's healthcare provider: Vernie Murders, PA   Chief Complaint  Patient presents with   Anxiety   Depression   Subjective    HPI  Depression, Follow-up  She  was last seen for this 1 years ago. Changes made at last visit include none.   She reports good compliance with treatment. She is not having side effects.   She reports excellent tolerance of treatment. Current symptoms include:  none She feels she is Improved since last visit.  Depression screen [facility] 2/9 01/16/2020 01/09/2019 11/26/2018  Decreased Interest 0 0 0  Down, Depressed, Hopeless 0 0 0  PHQ - 2 Score 0 0 0  Altered sleeping 0 0 -  Tired, decreased energy 0 0 -  Change in appetite 0 0 -  Feeling bad or failure about yourself  0 0 -  Trouble concentrating 0 0 -  Moving slowly or fidgety/restless 0 0 -  Suicidal thoughts 0 0 -  PHQ-9 Score 0 0 -  Difficult doing work/chores Not difficult at all Not difficult at all -    ----------------------------------------------------------------------------------------- Anxiety, Follow-up  She was last seen for anxiety 1 years ago. Changes made at last visit include none.   She reports good compliance with treatment. She reports good tolerance of treatment. She is not having side effects.   She feels her anxiety is mild and Improved since last visit.  Symptoms: No chest pain No difficulty concentrating  No dizziness No fatigue  No feelings of losing control No insomnia  Yes irritable No palpitations  No panic attacks Yes racing thoughts  No shortness of breath No sweating  No tremors/shakes    GAD-7 Results No flowsheet data found.  PHQ-9 Scores PHQ9 SCORE ONLY 01/16/2020 01/09/2019 11/26/2018  PHQ-9 Total Score 0 0 0     ---------------------------------------------------------------------------------------------------   Patient Active Problem List   Diagnosis Date Noted   S/P total knee arthroplasty, left 03/12/2019   Arthritis of left knee 02/26/2019   Wound infection 11/18/2018   Lumbar stenosis 10/30/2018   Herniated nucleus pulposus, lumbar 10/30/2018   Spinal stenosis of lumbar region 10/16/2018   Lumbar herniated disc 10/14/2018   Alcohol drinker 05/20/2015   Anxiety and depression 05/20/2015   History of artificial joint 05/20/2015   HLD (hyperlipidemia) 05/20/2015   Avitaminosis D 05/20/2015   Chronic cough 01/20/2014   Adiposity 01/18/2012   Irregular bleeding 11/27/2006   Tobacco use 06/23/2005   Past Medical History:  Diagnosis Date   Anxiety    Arthritis    Depression    Hyperlipidemia    Lumbar stenosis    Sleep apnea    Waiting for Cpap   Family History  Problem Relation Age of Onset   Lung cancer Father    Healthy Brother    Aneurysm Maternal Grandmother    Breast cancer Maternal Grandmother        mat great gm   Lung cancer Maternal Grandfather    Coronary artery disease Paternal Grandmother    Breast cancer Paternal Grandmother    Diabetes Mother    Hypertension Mother    Social History   Tobacco Use   Smoking status: Every Day    Packs/day: 0.25    Years: 20.00    Pack years: 5.00    Types: Cigarettes   Smokeless tobacco: Never  Vaping Use  Vaping Use: Never used  Substance Use Topics   Alcohol use: Yes    Alcohol/week: 6.0 standard drinks    Types: 6 Cans of beer per week    Comment: none last hrs   Drug use: Yes    Frequency: 7.0 times per week    Types: Marijuana    Allergies  Allergen Reactions   Sulfa Antibiotics Hives   Medications: Outpatient Medications Prior to Visit  Medication Sig   albuterol (PROVENTIL) (2.5 MG/3ML) 0.083% nebulizer solution Take 3 mLs (2.5 mg total) by nebulization every 6 (six) hours as needed for  wheezing or shortness of breath.   albuterol (VENTOLIN HFA) 108 (90 Base) MCG/ACT inhaler Inhale 2 puffs into the lungs every 4 (four) hours as needed for wheezing or shortness of breath.   aspirin EC 325 MG EC tablet Take 1 tablet (325 mg total) by mouth daily with breakfast.   diazepam (VALIUM) 5 MG tablet TAKE ONE TABLET BY MOUTH TWICE DAILY AS NEEDED FOR ANXIETY OR AGITATION   gabapentin (NEURONTIN) 300 MG capsule TAKE 1 CAPSULE BY MOUTH THREE TIMES DAILY   meloxicam (MOBIC) 7.5 MG tablet TAKE 1 TABLET BY MOUTH TWICE DAILY   niacin 500 MG tablet Take 500 mg by mouth daily.   sertraline (ZOLOFT) 100 MG tablet TAKE ONE TABLET AT BEDTIME   methocarbamol (ROBAXIN) 500 MG tablet Take 1 tablet (500 mg total) by mouth every 6 (six) hours as needed for muscle spasms. (Patient not taking: Reported on 01/16/2020)   [DISCONTINUED] azithromycin (ZITHROMAX) 250 MG tablet Take 1 tablet (250 mg total) by mouth daily. Take first 2 tablets together, then 1 every day until finished.   [DISCONTINUED] oxyCODONE-acetaminophen (PERCOCET) 7.5-325 MG tablet Take 1 tablet by mouth every 6 (six) hours as needed for severe pain. (Patient not taking: Reported on 04/09/2019)   [DISCONTINUED] oxyCODONE-acetaminophen (PERCOCET/ROXICET) 5-325 MG tablet Take 1-2 tablets by mouth every 4 (four) hours as needed for severe pain. Post op pain (Patient not taking: Reported on 04/09/2019)   [DISCONTINUED] predniSONE (STERAPRED UNI-PAK 21 TAB) 10 MG (21) TBPK tablet Take by mouth daily. Take 6 tabs by mouth daily  for 1 day, then 5 tabs for 1 day, then 4 tabs for 1 day, then 3 tabs for 1 day, 2 tabs for 1 day, then 1 tab by mouth daily for 1 day   [DISCONTINUED] promethazine-dextromethorphan (PROMETHAZINE-DM) 6.25-15 MG/5ML syrup Take 5 mLs by mouth 3 (three) times daily as needed for cough.   No facility-administered medications prior to visit.    Review of Systems  Constitutional: Negative.   HENT:  Positive for postnasal drip.         Recent flare of allergies.  Eyes: Negative.   Respiratory:  Positive for wheezing.        Occasional wheezing since having pneumonia in June 2021.  Cardiovascular: Negative.   Gastrointestinal: Negative.   Genitourinary: Negative.   Musculoskeletal:  Positive for back pain.       Stable with history of laminectomy.  Neurological: Negative.      Objective    BP (!) 160/87    Pulse 74    Temp 98.1 F (36.7 C) (Oral)    Resp 16    Wt 247 lb 12.8 oz (112.4 kg)    LMP 01/31/1999    SpO2 100%    BMI 38.81 kg/m  BP Readings from Last 3 Encounters:  01/16/20 (!) 160/87  10/03/19 (!) 155/107  03/12/19 120/76   Wt Readings from  Last 3 Encounters:  01/16/20 247 lb 12.8 oz (112.4 kg)  03/12/19 239 lb 3.2 oz (108.5 kg)  02/26/19 239 lb 3.2 oz (108.5 kg)    Physical Exam Constitutional:      General: She is not in acute distress.    Appearance: She is well-developed.  HENT:     Head: Normocephalic and atraumatic.     Right Ear: Hearing and tympanic membrane normal.     Left Ear: Hearing and tympanic membrane normal.     Nose: Nose normal.  Eyes:     General: Lids are normal. No scleral icterus.       Right eye: No discharge.        Left eye: No discharge.     Conjunctiva/sclera: Conjunctivae normal.  Cardiovascular:     Rate and Rhythm: Normal rate and regular rhythm.     Heart sounds: Normal heart sounds.  Pulmonary:     Effort: Pulmonary effort is normal. No respiratory distress.     Breath sounds: Normal breath sounds.  Abdominal:     General: Bowel sounds are normal.     Palpations: Abdomen is soft.  Musculoskeletal:        General: Normal range of motion.  Skin:    Findings: No lesion or rash.  Neurological:     Mental Status: She is alert and oriented to person, place, and time.  Psychiatric:        Speech: Speech normal.        Behavior: Behavior normal.        Thought Content: Thought content normal.      No results found for any visits on 01/16/20.   Assessment & Plan     1. Anxiety and depression Anxiety, agitation and depression/sadness with spontaneous crying spells well controlled with Zoloft 100 mg qd and using Valium 5 mg 1 tablet BID prn flare of anxiety/agitation. Recheck labs and refilled Diazepam. - CBC with Differential/Platelet - Comprehensive metabolic panel - TSH - diazepam (VALIUM) 5 MG tablet; TAKE ONE TABLET BY MOUTH TWICE DAILY AS NEEDED FOR ANXIETY OR AGITATION  Dispense: 30 tablet; Refill: 0  2. Mixed hyperlipidemia One year ago Triglycerides were 177, HDL 52 and LDL 108 with total cholesterol 191. Recommend she lose some weight and follow a low fat diet. Recheck labs to assess progress. - Comprehensive metabolic panel - Lipid panel - TSH  3. Tobacco use Still smoking 1-2 ppd. Uses cigarettes as an emotional pacifier. Making asthma worse. Counseled regarding smoking cessation.  4. Elevated BP without diagnosis of hypertension Systolic pressure elevated today. Need to get anxiety/agitation under better control. Should stop all smoking and restrict salt intake. Recheck labs. - CBC with Differential/Platelet - Comprehensive metabolic panel - TSH  5. Mild intermittent reactive airway disease with wheezing without complication Some wheezing intermittently. Would be better if she would stop all smoking. Use Albuterol Inhaler prn. Check CBC for signs of infection. - CBC with Differential/Platelet - albuterol (VENTOLIN HFA) 108 (90 Base) MCG/ACT inhaler; Inhale 2 puffs into the lungs every 4 (four) hours as needed for wheezing or shortness of breath.  Dispense: 18 g; Refill: 0   No follow-ups on file.      Andres Shad, PA, have reviewed all documentation for this visit. The documentation on 01/16/20 for the exam, diagnosis, procedures, and orders are all accurate and complete.    Vernie Murders, [place] [phone] (phone) [phone] (fax)  [place]

## 2020-01-22 ENCOUNTER — Other Ambulatory Visit: Payer: Self-pay | Admitting: Family Medicine

## 2020-01-22 DIAGNOSIS — M5416 Radiculopathy, lumbar region: Secondary | ICD-10-CM

## 2020-02-12 ENCOUNTER — Other Ambulatory Visit: Payer: Self-pay | Admitting: Family Medicine

## 2020-02-12 DIAGNOSIS — F419 Anxiety disorder, unspecified: Secondary | ICD-10-CM

## 2020-02-12 DIAGNOSIS — F32A Depression, unspecified: Secondary | ICD-10-CM

## 2020-02-18 ENCOUNTER — Other Ambulatory Visit: Payer: Self-pay

## 2020-02-18 ENCOUNTER — Ambulatory Visit: Payer: BC Managed Care – PPO | Admitting: Adult Health

## 2020-02-18 ENCOUNTER — Encounter: Payer: Self-pay | Admitting: Adult Health

## 2020-02-18 VITALS — BP 153/92 | HR 81 | Temp 98.3°F | Resp 16 | Wt 251.2 lb

## 2020-02-18 DIAGNOSIS — H60392 Other infective otitis externa, left ear: Secondary | ICD-10-CM | POA: Diagnosis not present

## 2020-02-18 DIAGNOSIS — H6501 Acute serous otitis media, right ear: Secondary | ICD-10-CM | POA: Diagnosis not present

## 2020-02-18 DIAGNOSIS — J302 Other seasonal allergic rhinitis: Secondary | ICD-10-CM | POA: Diagnosis not present

## 2020-02-18 DIAGNOSIS — H6090 Unspecified otitis externa, unspecified ear: Secondary | ICD-10-CM | POA: Insufficient documentation

## 2020-02-18 MED ORDER — PREDNISONE 10 MG (21) PO TBPK: ORAL_TABLET | ORAL | 0 refills | Status: AC

## 2020-02-18 MED ORDER — NEOMYCIN-POLYMYXIN-HC 3.5-10000-1 OT SOLN: 4.0000 [drp] | Freq: Four times a day (QID) | OTIC | 0 refills | Status: AC

## 2020-02-18 MED ORDER — AMOXICILLIN-POT CLAVULANATE 875-125 MG PO TABS: 1.0000 | ORAL_TABLET | Freq: Two times a day (BID) | ORAL | 0 refills | Status: DC

## 2020-02-18 NOTE — Patient Instructions (Signed)
Amoxicillin; Clavulanic Acid Tablets What is this medicine? AMOXICILLIN; CLAVULANIC ACID (a mox i SIL in; KLAV yoo lan ic AS id) is a penicillin antibiotic. It treats some infections caused by bacteria. It will not work for colds, the flu, or other viruses. This medicine may be used for other purposes; ask your health care provider or pharmacist if you have questions. COMMON BRAND NAME(S): Augmentin What should I tell my health care provider before I take this medicine? They need to know if you have any of these conditions:  bowel disease, like colitis  kidney disease  liver disease  mononucleosis  an unusual or allergic reaction to amoxicillin, penicillin, cephalosporin, other antibiotics, clavulanic acid, other medicines, foods, dyes, or preservatives  pregnant or trying to get pregnant  breast-feeding How should I use this medicine? Take this drug by mouth. Take it as directed on the prescription label at the same time every day. Take it with food at the start of a meal or snack. Take all of this drug unless your health care provider tells you to stop it early. Keep taking it even if you think you are better. Talk to your health care provider about the use of this drug in children. While it may be prescribed for selected conditions, precautions do apply. Overdosage: If you think you have taken too much of this medicine contact a poison control center or emergency room at once. NOTE: This medicine is only for you. Do not share this medicine with others. What if I miss a dose? If you miss a dose, take it as soon as you can. If it is almost time for your next dose, take only that dose. Do not take double or extra doses. What may interact with this medicine?  allopurinol  anticoagulants  birth control pills  methotrexate  probenecid This list may not describe all possible interactions. Give your health care provider a list of all the medicines, herbs, non-prescription drugs, or  dietary supplements you use. Also tell them if you smoke, drink alcohol, or use illegal drugs. Some items may interact with your medicine. What should I watch for while using this medicine? Tell your doctor or healthcare provider if your symptoms do not improve. This medicine may cause serious skin reactions. They can happen weeks to months after starting the medicine. Contact your healthcare provider right away if you notice fevers or flu-like symptoms with a rash. The rash may be red or purple and then turn into blisters or peeling of the skin. Or, you might notice a red rash with swelling of the face, lips or lymph nodes in your neck or under your arms. Do not treat diarrhea with over the counter products. Contact your doctor if you have diarrhea that lasts more than 2 days or if it is severe and watery. If you have diabetes, you may get a false-positive result for sugar in your urine. Check with your doctor or healthcare provider. Birth control pills may not work properly while you are taking this medicine. Talk to your doctor about using an extra method of birth control. What side effects may I notice from receiving this medicine? Side effects that you should report to your doctor or health care professional as soon as possible:  allergic reactions like skin rash, itching or hives, swelling of the face, lips, or tongue  breathing problems  dark urine  fever or chills, sore throat  redness, blistering, peeling, or loosening of the skin, including inside the mouth  seizures  trouble passing urine or change in the amount of urine  unusual bleeding, bruising  unusually weak or tired  white patches or sores in the mouth or throat Side effects that usually do not require medical attention (report to your doctor or health care professional if they continue or are bothersome):  diarrhea  dizziness  headache  nausea, vomiting  stomach upset  vaginal or anal irritation This list  may not describe all possible side effects. Call your doctor for medical advice about side effects. You may report side effects to FDA at 1-800-FDA-1088. Where should I keep my medicine? Keep out of the reach of children and pets. Store at room temperature between 20 and 25 degrees C (68 and 77 degrees F). Throw away any unused drug after the expiration date. NOTE: This sheet is a summary. It may not cover all possible information. If you have questions about this medicine, talk to your doctor, pharmacist, or health care provider.  2020 Elsevier/Gold Standard (2018-11-04 11:55:53) Otitis Externa  Otitis externa is an infection of the outer ear canal. The outer ear canal is the area between the outside of the ear and the eardrum. Otitis externa is sometimes called swimmer's ear. What are the causes? Common causes of this condition include:  Swimming in dirty water.  Moisture in the ear.  An injury to the inside of the ear.  An object stuck in the ear.  A cut or scrape on the outside of the ear. What increases the risk? You are more likely to get this condition if you go swimming often. What are the signs or symptoms?  Itching in the ear. This is often the first symptom.  Swelling of the ear.  Redness in the ear.  Ear pain. The pain may get worse when you pull on your ear.  Pus coming from the ear. How is this treated? This condition may be treated with:  Antibiotic ear drops. These are often given for 10-14 days.  Medicines to reduce itching and swelling. Follow these instructions at home:  If you were given antibiotic ear drops, use them as told by your doctor. Do not stop using them even if your condition gets better.  Take over-the-counter and prescription medicines only as told by your doctor.  Avoid getting water in your ears as told by your doctor. You may be told to avoid swimming or water sports for a few days.  Keep all follow-up visits as told by your doctor.  This is important. How is this prevented?  Keep your ears dry. Use the corner of a towel to dry your ears after you swim or bathe.  Try not to scratch or put things in your ear. Doing these things makes it easier for germs to grow in your ear.  Avoid swimming in lakes, dirty water, or pools that may not have the right amount of a chemical called chlorine. Contact a doctor if:  You have a fever.  Your ear is still red, swollen, or painful after 3 days.  You still have pus coming from your ear after 3 days.  Your redness, swelling, or pain gets worse.  You have a really bad headache.  You have redness, swelling, pain, or tenderness behind your ear. Summary  Otitis externa is an infection of the outer ear canal.  Symptoms include pain, redness, and swelling of the ear.  If you were given antibiotic ear drops, use them as told by your doctor. Do not stop using them even  if your condition gets better.  Try not to scratch or put things in your ear. This information is not intended to replace advice given to you by your health care provider. Make sure you discuss any questions you have with your health care provider. Document Revised: 09/07/2017 Document Reviewed: 09/07/2017 Elsevier Patient Education  [employer]. Otitis Media, Adult  Otitis media means that the middle ear is red and swollen (inflamed) and full of fluid. The condition usually goes away on its own. Follow these instructions at home:  Take over-the-counter and prescription medicines only as told by your doctor.  If you were prescribed an antibiotic medicine, take it as told by your doctor. Do not stop taking the antibiotic even if you start to feel better.  Keep all follow-up visits as told by your doctor. This is important. Contact a doctor if:  You have bleeding from your nose.  There is a lump on your neck.  You are not getting better in 5 days.  You feel worse instead of better. Get help right  away if:  You have pain that is not helped with medicine.  You have swelling, redness, or pain around your ear.  You get a stiff neck.  You cannot move part of your face (paralyzed).  You notice that the bone behind your ear hurts when you touch it.  You get a very bad headache. Summary  Otitis media means that the middle ear is red, swollen, and full of fluid.  This condition usually goes away on its own. In some cases, treatment may be needed.  If you were prescribed an antibiotic medicine, take it as told by your doctor. This information is not intended to replace advice given to you by your health care provider. Make sure you discuss any questions you have with your health care provider. Document Revised: 03/16/2017 Document Reviewed: 04/24/2016 Elsevier Patient Education  2020 [employer].

## 2020-02-18 NOTE — Progress Notes (Addendum)
Established patient visit   Patient: Emily Bender   DOB: Jul 20, 1965   54 y.o. Female  MRN: [id number] Visit Date: 02/18/2020  Today's healthcare provider: Marcille Buffy, FNP   Chief Complaint  Patient presents with  . Ear Pain   Subjective    Otalgia  There is pain in the right ear. This is a new problem. The current episode started in the past 7 days. The problem has been gradually worsening. There has been no fever. The pain is severe. Associated symptoms include hearing loss and rhinorrhea. Pertinent negatives include no abdominal pain, coughing, diarrhea, ear discharge, headaches, neck pain, rash, sore throat or vomiting. She has tried nothing for the symptoms.   onset since this past Sunday.   Nasal congestion.  No loss of taste or smell.   Patient  denies any fever, body aches,chills, rash, chest pain, shortness of breath, nausea, vomiting, or diarrhea.  Denies dizziness, lightheadedness, pre syncopal or syncopal episodes.          Medications: Outpatient Medications Prior to Visit  Medication Sig  . albuterol (VENTOLIN HFA) 108 (90 Base) MCG/ACT inhaler Inhale 2 puffs into the lungs every 4 (four) hours as needed for wheezing or shortness of breath.  Marland Kitchen aspirin EC 325 MG EC tablet Take 1 tablet (325 mg total) by mouth daily with breakfast.  . diazepam (VALIUM) 5 MG tablet TAKE ONE TABLET BY MOUTH TWICE DAILY AS NEEDED FOR ANXIETY OR AGITATION  . gabapentin (NEURONTIN) 300 MG capsule TAKE 1 CAPSULE BY MOUTH THREE TIMES DAILY  . meloxicam (MOBIC) 7.5 MG tablet TAKE 1 TABLET BY MOUTH TWICE DAILY  . sertraline (ZOLOFT) 100 MG tablet TAKE ONE TABLET AT BEDTIME  . methocarbamol (ROBAXIN) 500 MG tablet Take 1 tablet (500 mg total) by mouth every 6 (six) hours as needed for muscle spasms. (Patient not taking: Reported on 01/16/2020)  . niacin 500 MG tablet Take 500 mg by mouth daily. (Patient not taking: Reported on 02/18/2020)   No facility-administered  medications prior to visit.    Review of Systems  Constitutional: Negative.   HENT: Positive for ear pain, hearing loss and rhinorrhea. Negative for congestion, dental problem, drooling, ear discharge, facial swelling, mouth sores, nosebleeds, postnasal drip, sinus pressure, sinus pain, sore throat, tinnitus, trouble swallowing and voice change.   Respiratory: Negative.  Negative for cough.   Cardiovascular: Negative.   Gastrointestinal: Negative for abdominal distention, abdominal pain, anal bleeding, blood in stool, constipation, diarrhea, nausea, rectal pain and vomiting.  Genitourinary: Negative.   Musculoskeletal: Negative.  Negative for neck pain.  Skin: Negative.  Negative for rash.  Neurological: Negative.  Negative for headaches.  Hematological: Negative.   Psychiatric/Behavioral: Negative.     Last CBC Lab Results  Component Value Date   WBC 11.8 (H) 02/28/2019   HGB 11.6 (L) 02/28/2019   HCT 34.6 (L) 02/28/2019   MCV 92.5 02/28/2019   MCH 31.0 02/28/2019   RDW 13.0 02/28/2019   PLT 206 51/05/5850   Last metabolic panel Lab Results  Component Value Date   GLUCOSE 136 (H) 02/27/2019   NA 135 02/27/2019   K 4.1 02/27/2019   CL 102 02/27/2019   CO2 22 02/27/2019   BUN 12 02/27/2019   CREATININE 0.58 02/27/2019   GFRNONAA >60 02/27/2019   GFRAA >60 02/27/2019   CALCIUM 8.8 (L) 02/27/2019   PROT 7.1 02/18/2019   ALBUMIN 4.1 02/18/2019   LABGLOB 2.1 01/10/2019   AGRATIO 2.0 01/10/2019  BILITOT 0.6 02/18/2019   ALKPHOS 71 02/18/2019   AST 14 (L) 02/18/2019   ALT 14 02/18/2019   ANIONGAP 11 02/27/2019   Last lipids Lab Results  Component Value Date   CHOL 191 01/10/2019   HDL 52 01/10/2019   LDLCALC 108 (H) 01/10/2019   TRIG 177 (H) 01/10/2019   CHOLHDL 3.7 01/10/2019   Last hemoglobin A1c Lab Results  Component Value Date   HGBA1C 5.4 03/13/2016      Objective    BP (!) 153/92   Pulse 81   Temp 98.3 F (36.8 C) (Oral)   Resp 16   Wt 251 lb  3.2 oz (113.9 kg)   LMP 01/31/1999   SpO2 94%   BMI 39.34 kg/m    Physical Exam Vitals reviewed.  Constitutional:      Appearance: Normal appearance. She is not ill-appearing.     Comments: Patient is alert and oriented and responsive to questions Engages in eye contact with provider. Speaks in full sentences without any pauses without any shortness of breath or distress.    HENT:     Head: Normocephalic and atraumatic.     Right Ear: External ear normal. Swelling and tenderness present. No laceration or drainage. A middle ear effusion is present. No mastoid tenderness. Tympanic membrane is erythematous and bulging. Tympanic membrane is not injected or perforated.     Left Ear: External ear normal. Swelling and tenderness present. A middle ear effusion is present. Tympanic membrane is not erythematous or retracted.     Nose: Rhinorrhea present. Rhinorrhea is clear.     Right Sinus: No maxillary sinus tenderness or frontal sinus tenderness.     Left Sinus: No maxillary sinus tenderness or frontal sinus tenderness.     Mouth/Throat:     Lips: Pink.     Mouth: Mucous membranes are moist.     Pharynx: Uvula midline. Posterior oropharyngeal erythema present. No pharyngeal swelling, oropharyngeal exudate or uvula swelling.  Eyes:     General: No scleral icterus.       Right eye: No discharge.        Left eye: No discharge.     Conjunctiva/sclera: Conjunctivae normal.  Cardiovascular:     Rate and Rhythm: Normal rate and regular rhythm.     Pulses: Normal pulses.     Heart sounds: No murmur heard.  No friction rub. No gallop.   Pulmonary:     Effort: Pulmonary effort is normal. No respiratory distress.     Breath sounds: Normal breath sounds. No stridor. No wheezing, rhonchi or rales.  Chest:     Chest wall: No tenderness.  Abdominal:     Palpations: Abdomen is soft.  Musculoskeletal:        General: Normal range of motion.     Cervical back: Normal range of motion and neck  supple.     Right lower leg: No edema.     Left lower leg: No edema.  Lymphadenopathy:     Cervical: Cervical adenopathy present.  Skin:    General: Skin is warm.     Capillary Refill: Capillary refill takes less than 2 seconds.  Neurological:     Mental Status: She is oriented to person, place, and time.  Psychiatric:        Mood and Affect: Mood normal.        Behavior: Behavior normal.        Thought Content: Thought content normal.        Judgment:  Judgment normal.      No results found for any visits on 02/18/20.  Assessment & Plan      Non-recurrent acute serous otitis media of right ear - Plan: amoxicillin-clavulanate (AUGMENTIN) 875-125 MG tablet, predniSONE (STERAPRED UNI-PAK 21 TAB) 10 MG (21) TBPK tablet, neomycin-polymyxin-hydrocortisone (CORTISPORIN) OTIC solution  Other infective acute otitis externa of left ear - Plan: amoxicillin-clavulanate (AUGMENTIN) 875-125 MG tablet, predniSONE (STERAPRED UNI-PAK 21 TAB) 10 MG (21) TBPK tablet, neomycin-polymyxin-hydrocortisone (CORTISPORIN) OTIC solution  Seasonal allergic rhinitis, unspecified trigger   Meds ordered this encounter  Medications  . amoxicillin-clavulanate (AUGMENTIN) 875-125 MG tablet    Sig: Take 1 tablet by mouth 2 (two) times daily.    Dispense:  20 tablet    Refill:  0  . predniSONE (STERAPRED UNI-PAK 21 TAB) 10 MG (21) TBPK tablet    Sig: PO: Take 6 tablets on day 1:Take 5 tablets day 2:Take 4 tablets day 3: Take 3 tablets day 4:Take 2 tablets day five: 5 Take 1 tablet day 6    Dispense:  21 tablet    Refill:  0  . neomycin-polymyxin-hydrocortisone (CORTISPORIN) OTIC solution    Sig: Place 4 drops into the left ear 4 (four) times daily.    Dispense:  10 mL    Refill:  0    Recommend flonase and daily antihistamine if not taking.   Red Flags discussed. The patient was given clear instructions to go to ER or return to medical center if any red flags develop, symptoms do not improve, worsen or  new problems develop. They verbalized understanding. Declines any covid testing.   Return in about 1 week (around 02/25/2020), or if symptoms worsen or fail to improve, for at any time for any worsening symptoms, Go to Emergency room/ urgent care if worse.         Marcille Buffy, [place] [phone] (phone) [phone] (fax)  [place]

## 2020-03-05 DIAGNOSIS — R030 Elevated blood-pressure reading, without diagnosis of hypertension: Secondary | ICD-10-CM | POA: Diagnosis not present

## 2020-03-05 DIAGNOSIS — F419 Anxiety disorder, unspecified: Secondary | ICD-10-CM | POA: Diagnosis not present

## 2020-03-05 DIAGNOSIS — E782 Mixed hyperlipidemia: Secondary | ICD-10-CM | POA: Diagnosis not present

## 2020-03-05 DIAGNOSIS — J4520 Mild intermittent asthma, uncomplicated: Secondary | ICD-10-CM | POA: Diagnosis not present

## 2020-03-05 DIAGNOSIS — F32A Depression, unspecified: Secondary | ICD-10-CM | POA: Diagnosis not present

## 2020-03-06 LAB — CBC WITH DIFFERENTIAL/PLATELET
Basophils Absolute: 0.1 10*3/uL (ref 0.0–0.2)
Basos: 1 %
EOS (ABSOLUTE): 0.2 10*3/uL (ref 0.0–0.4)
Eos: 2 %
Hematocrit: 47.8 % — ABNORMAL HIGH (ref 34.0–46.6)
Hemoglobin: 16.6 g/dL — ABNORMAL HIGH (ref 11.1–15.9)
Immature Grans (Abs): 0.1 10*3/uL (ref 0.0–0.1)
Immature Granulocytes: 1 %
Lymphocytes Absolute: 2.7 10*3/uL (ref 0.7–3.1)
Lymphs: 28 %
MCH: 31.6 pg (ref 26.6–33.0)
MCHC: 34.7 g/dL (ref 31.5–35.7)
MCV: 91 fL (ref 79–97)
Monocytes Absolute: 0.6 10*3/uL (ref 0.1–0.9)
Monocytes: 6 %
Neutrophils Absolute: 5.9 10*3/uL (ref 1.4–7.0)
Neutrophils: 62 %
Platelets: 257 10*3/uL (ref 150–450)
RBC: 5.25 x10E6/uL (ref 3.77–5.28)
RDW: 12.3 % (ref 11.7–15.4)
WBC: 9.5 10*3/uL (ref 3.4–10.8)

## 2020-03-06 LAB — COMPREHENSIVE METABOLIC PANEL
ALT: 24 IU/L (ref 0–32)
AST: 25 IU/L (ref 0–40)
Albumin/Globulin Ratio: 2.3 — ABNORMAL HIGH (ref 1.2–2.2)
Albumin: 4.6 g/dL (ref 3.8–4.9)
Alkaline Phosphatase: 84 IU/L (ref 44–121)
BUN/Creatinine Ratio: 15 (ref 9–23)
BUN: 11 mg/dL (ref 6–24)
Bilirubin Total: 0.5 mg/dL (ref 0.0–1.2)
CO2: 18 mmol/L — ABNORMAL LOW (ref 20–29)
Calcium: 9.6 mg/dL (ref 8.7–10.2)
Chloride: 103 mmol/L (ref 96–106)
Creatinine, Ser: 0.71 mg/dL (ref 0.57–1.00)
GFR calc Af Amer: 112 mL/min/{1.73_m2} (ref >59)
GFR calc non Af Amer: 98 mL/min/{1.73_m2} (ref >59)
Globulin, Total: 2 g/dL (ref 1.5–4.5)
Glucose: 127 mg/dL — ABNORMAL HIGH (ref 65–99)
Potassium: 4.3 mmol/L (ref 3.5–5.2)
Sodium: 138 mmol/L (ref 134–144)
Total Protein: 6.6 g/dL (ref 6.0–8.5)

## 2020-03-06 LAB — LIPID PANEL
Chol/HDL Ratio: 4.2 ratio (ref 0.0–4.4)
Cholesterol, Total: 202 mg/dL — ABNORMAL HIGH (ref 100–199)
HDL: 48 mg/dL (ref >39)
LDL Chol Calc (NIH): 116 mg/dL — ABNORMAL HIGH (ref 0–99)
Triglycerides: 219 mg/dL — ABNORMAL HIGH (ref 0–149)
VLDL Cholesterol Cal: 38 mg/dL (ref 5–40)

## 2020-03-06 LAB — TSH: TSH: 2.39 u[IU]/mL (ref 0.450–4.500)

## 2020-03-25 ENCOUNTER — Other Ambulatory Visit: Payer: Self-pay | Admitting: Family Medicine

## 2020-03-25 DIAGNOSIS — M5416 Radiculopathy, lumbar region: Secondary | ICD-10-CM

## 2020-04-16 ENCOUNTER — Other Ambulatory Visit: Payer: Self-pay | Admitting: Family Medicine

## 2020-04-16 DIAGNOSIS — M5416 Radiculopathy, lumbar region: Secondary | ICD-10-CM

## 2020-06-22 ENCOUNTER — Other Ambulatory Visit: Payer: Self-pay | Admitting: Family Medicine

## 2020-06-22 DIAGNOSIS — M5416 Radiculopathy, lumbar region: Secondary | ICD-10-CM

## 2020-10-01 IMAGING — CR DG KNEE 1-2V*L*
2 series · 2 of 2 positions shown · non-contrast
Comparison: Preoperative radiograph 01/08/2019

CLINICAL DATA: Postop total left knee replacement.

EXAM:
LEFT KNEE - 1-2 VIEW

[AP]
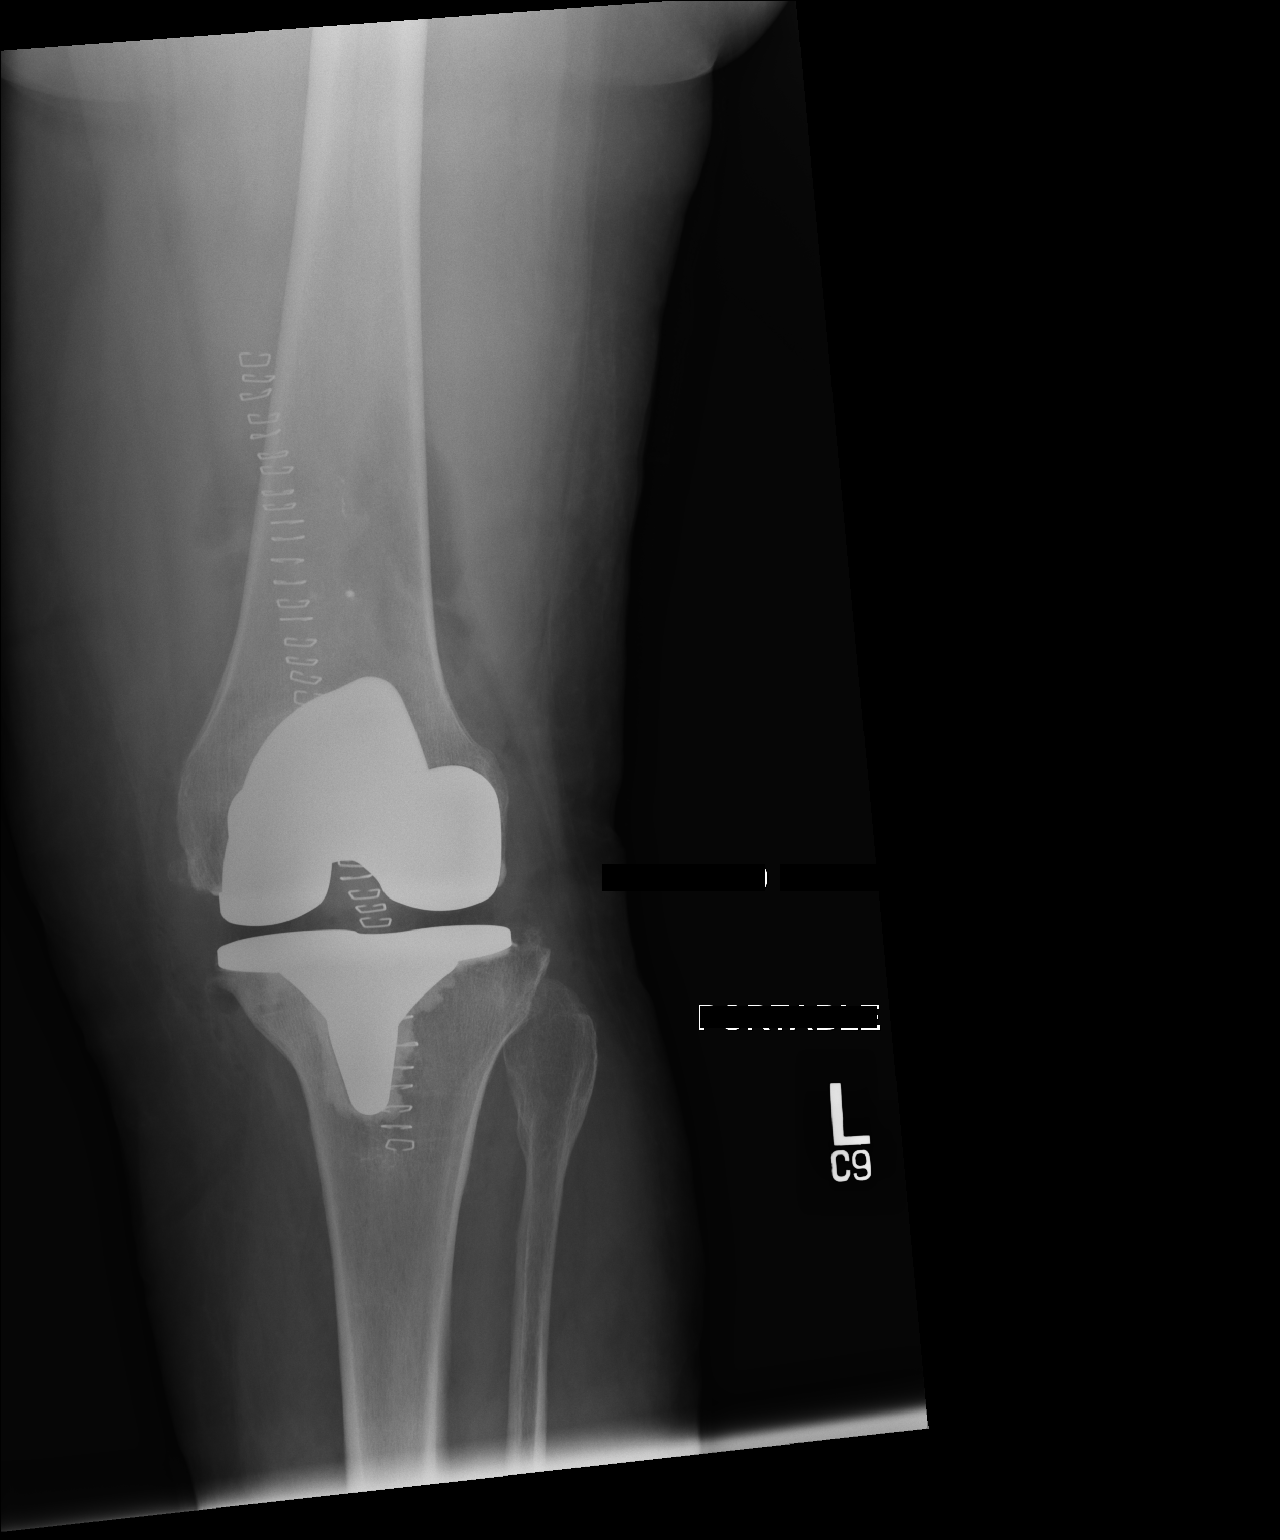

[lateral]
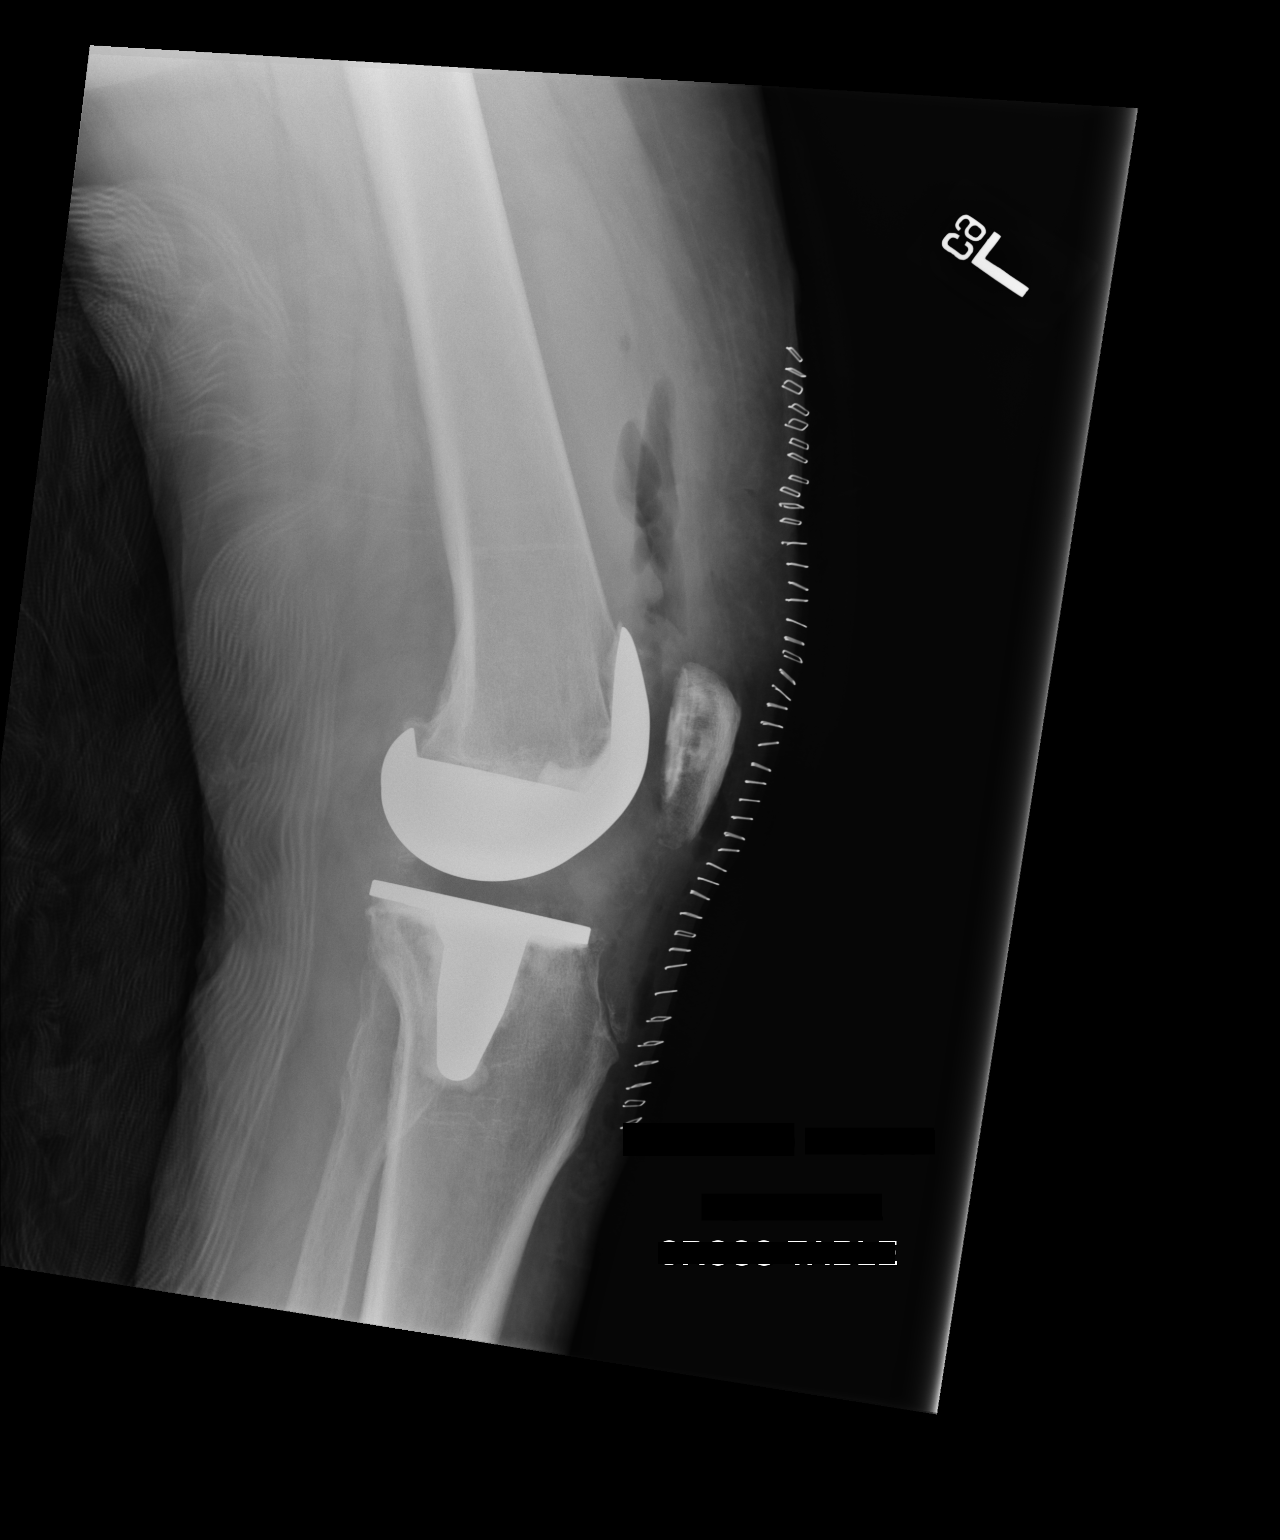

[2 of 2 positions shown; findings below may reference images not displayed]

FINDINGS: Total left knee arthroplasty in expected alignment. No
periprosthetic lucency or fracture. There is been patellar
resurfacing. Recent postsurgical change includes air and edema in
the soft tissues and joint. Anterior skin staples noted.
IMPRESSION: Post left knee arthroplasty without immediate postoperative
complication.

## 2020-10-10 DIAGNOSIS — J0100 Acute maxillary sinusitis, unspecified: Secondary | ICD-10-CM | POA: Diagnosis not present

## 2020-10-10 DIAGNOSIS — R070 Pain in throat: Secondary | ICD-10-CM | POA: Diagnosis not present

## 2020-10-10 DIAGNOSIS — R0981 Nasal congestion: Secondary | ICD-10-CM | POA: Diagnosis not present

## 2020-10-10 DIAGNOSIS — F17200 Nicotine dependence, unspecified, uncomplicated: Secondary | ICD-10-CM | POA: Diagnosis not present

## 2020-10-12 ENCOUNTER — Ambulatory Visit
Admission: EM | Admit: 2020-10-12 | Discharge: 2020-10-12 | Disposition: A | Discharge status: Home / Self Care | Payer: BC Managed Care – PPO

## 2020-10-12 ENCOUNTER — Emergency Department: Payer: BC Managed Care – PPO

## 2020-10-12 ENCOUNTER — Encounter: Payer: Self-pay | Admitting: Emergency Medicine

## 2020-10-12 ENCOUNTER — Other Ambulatory Visit: Payer: Self-pay

## 2020-10-12 ENCOUNTER — Emergency Department
Admission: EM | Admit: 2020-10-12 | Discharge: 2020-10-12 | Disposition: A | Discharge status: Home / Self Care | Payer: BC Managed Care – PPO | Attending: Emergency Medicine | Admitting: Emergency Medicine

## 2020-10-12 DIAGNOSIS — Z96652 Presence of left artificial knee joint: Secondary | ICD-10-CM | POA: Diagnosis not present

## 2020-10-12 DIAGNOSIS — J069 Acute upper respiratory infection, unspecified: Secondary | ICD-10-CM

## 2020-10-12 DIAGNOSIS — J441 Chronic obstructive pulmonary disease with (acute) exacerbation: Secondary | ICD-10-CM | POA: Insufficient documentation

## 2020-10-12 DIAGNOSIS — R1030 Lower abdominal pain, unspecified: Secondary | ICD-10-CM | POA: Diagnosis not present

## 2020-10-12 DIAGNOSIS — Z7982 Long term (current) use of aspirin: Secondary | ICD-10-CM | POA: Insufficient documentation

## 2020-10-12 DIAGNOSIS — R0602 Shortness of breath: Secondary | ICD-10-CM | POA: Diagnosis not present

## 2020-10-12 DIAGNOSIS — F17210 Nicotine dependence, cigarettes, uncomplicated: Secondary | ICD-10-CM | POA: Insufficient documentation

## 2020-10-12 DIAGNOSIS — R197 Diarrhea, unspecified: Secondary | ICD-10-CM | POA: Diagnosis not present

## 2020-10-12 DIAGNOSIS — R109 Unspecified abdominal pain: Secondary | ICD-10-CM | POA: Diagnosis not present

## 2020-10-12 DIAGNOSIS — S3662XA Contusion of rectum, initial encounter: Secondary | ICD-10-CM | POA: Diagnosis not present

## 2020-10-12 DIAGNOSIS — R059 Cough, unspecified: Secondary | ICD-10-CM | POA: Diagnosis not present

## 2020-10-12 LAB — URINALYSIS, COMPLETE (UACMP) WITH MICROSCOPIC
Bilirubin Urine: NEGATIVE
Glucose, UA: NEGATIVE mg/dL
Ketones, ur: NEGATIVE mg/dL
Leukocytes,Ua: NEGATIVE
Nitrite: NEGATIVE
Protein, ur: 100 mg/dL — AB
Specific Gravity, Urine: 1.027 (ref 1.005–1.030)
pH: 5 (ref 5.0–8.0)

## 2020-10-12 LAB — CBC
HCT: 45 % (ref 36.0–46.0)
Hemoglobin: 15.3 g/dL — ABNORMAL HIGH (ref 12.0–15.0)
MCH: 31.2 pg (ref 26.0–34.0)
MCHC: 34 g/dL (ref 30.0–36.0)
MCV: 91.6 fL (ref 80.0–100.0)
Platelets: 200 K/uL (ref 150–400)
RBC: 4.91 MIL/uL (ref 3.87–5.11)
RDW: 12.2 % (ref 11.5–15.5)
WBC: 9.9 K/uL (ref 4.0–10.5)
nRBC: 0 % (ref 0.0–0.2)

## 2020-10-12 LAB — COMPREHENSIVE METABOLIC PANEL WITH GFR
ALT: 20 U/L (ref 0–44)
AST: 25 U/L (ref 15–41)
Albumin: 4.2 g/dL (ref 3.5–5.0)
Alkaline Phosphatase: 62 U/L (ref 38–126)
Anion gap: 9 (ref 5–15)
BUN: 11 mg/dL (ref 6–20)
CO2: 23 mmol/L (ref 22–32)
Calcium: 8.9 mg/dL (ref 8.9–10.3)
Chloride: 100 mmol/L (ref 98–111)
Creatinine, Ser: 0.65 mg/dL (ref 0.44–1.00)
GFR, Estimated: >60 mL/min
Glucose, Bld: 153 mg/dL — ABNORMAL HIGH (ref 70–99)
Potassium: 3.6 mmol/L (ref 3.5–5.1)
Sodium: 132 mmol/L — ABNORMAL LOW (ref 135–145)
Total Bilirubin: 0.7 mg/dL (ref 0.3–1.2)
Total Protein: 7.7 g/dL (ref 6.5–8.1)

## 2020-10-12 LAB — LIPASE, BLOOD: Lipase: 25 U/L (ref 11–51)

## 2020-10-12 MED ORDER — METHYLPREDNISOLONE SODIUM SUCC 125 MG IJ SOLR
125.0000 mg | Freq: Once | INTRAMUSCULAR | Status: AC
  Administered 2020-10-12: 125 mg via INTRAVENOUS
  Filled 2020-10-12: qty 2

## 2020-10-12 MED ORDER — ALBUTEROL SULFATE HFA 108 (90 BASE) MCG/ACT IN AERS: 2.0000 | INHALATION_SPRAY | Freq: Four times a day (QID) | RESPIRATORY_TRACT | 2 refills | Status: AC | PRN

## 2020-10-12 MED ORDER — METHYLPREDNISOLONE 4 MG PO TBPK: ORAL_TABLET | ORAL | 0 refills | Status: AC

## 2020-10-12 MED ORDER — IPRATROPIUM-ALBUTEROL 0.5-2.5 (3) MG/3ML IN SOLN
3.0000 mL | Freq: Once | RESPIRATORY_TRACT | Status: AC
  Administered 2020-10-12: 3 mL via RESPIRATORY_TRACT
  Filled 2020-10-12: qty 3

## 2020-10-12 MED ORDER — ALBUTEROL SULFATE (2.5 MG/3ML) 0.083% IN NEBU
2.5000 mg | INHALATION_SOLUTION | RESPIRATORY_TRACT | 2 refills | Status: AC | PRN
Start: 2020-10-12 — End: 2021-10-12

## 2020-10-12 NOTE — ED Notes (Signed)
Provider notified of SOB and 02 sat of 92% on RA, Oxygen applied at 2L [place], provider in with patient.

## 2020-10-12 NOTE — ED Triage Notes (Signed)
Pt presents today with c/o of left lower abdominal pain that began this morning. +diarrhea, +cough

## 2020-10-12 NOTE — ED Triage Notes (Signed)
Pt reports sob that began 3 days ago with cough and sore throat, went [facility] Saturday, strep test was done and negative. [facility] and covid test was done but hasn't received any results yet. Pt reports she began having extreme left lower abd pain last night with diarrhea. Denies fever.

## 2020-10-12 NOTE — ED Notes (Signed)
See triage note, pt reports LLQ pain that started last night, diarrhea for several days. Denies vomiting, fevers. Pt in NAD.  Reports shob for several days constantly.

## 2020-10-12 NOTE — ED Provider Notes (Signed)
[facility]    CSN: [id number] Arrival date & time: 10/12/20  0827      History   Chief Complaint Chief Complaint  Patient presents with   Abdominal Pain    HPI Emily Bender is a 55 y.o. female.  Patient presents with shortness of breath, sore throat, and cough x 3 days.  She woke up this morning with left lower abdominal pain.  She had diarrhea during the night.  She went to another urgent care 2 days ago for her sore throat; negative strep test; started on amoxicillin.  She denies fever, chills, vomiting, or other symptoms.  She is a current every day smoker.  Her medical history includes chronic cough, lumbar herniated disc, lumbar stenosis, arthritis, seasonal allergies, anxiety/depression, tobacco use.  The history is provided by the patient and medical records.   Past Medical History:  Diagnosis Date   Anxiety    Arthritis    Depression    Hyperlipidemia    Lumbar stenosis    Sleep apnea    Waiting for Cpap    Patient Active Problem List   Diagnosis Date Noted   Non-recurrent acute serous otitis media of right ear 02/18/2020   Otitis externa 02/18/2020   Seasonal allergic rhinitis 02/18/2020   S/P total knee arthroplasty, left 03/12/2019   Arthritis of left knee 02/26/2019   Wound infection 11/18/2018   Lumbar stenosis 10/30/2018   Herniated nucleus pulposus, lumbar 10/30/2018   Spinal stenosis of lumbar region 10/16/2018   Lumbar herniated disc 10/14/2018   Alcohol drinker 05/20/2015   Anxiety and depression 05/20/2015   History of artificial joint 05/20/2015   HLD (hyperlipidemia) 05/20/2015   Avitaminosis D 05/20/2015   Chronic cough 01/20/2014   Adiposity 01/18/2012   Irregular bleeding 11/27/2006   Tobacco use 06/23/2005    Past Surgical History:  Procedure Laterality Date   ANKLE SURGERY  68/02/5725   x3   APPLICATION OF WOUND VAC N/A 11/18/2018   Procedure: Application Of Wound Vac;  Surgeon: Marybelle Killings, MD;  Location: [facility];   Service: Orthopedics;  Laterality: N/A;   BACK SURGERY     BREAST BIOPSY Left 1990's   neg   CHOLECYSTECTOMY  2000   COLONOSCOPY WITH PROPOFOL N/A 01/31/2019   Procedure: COLONOSCOPY WITH PROPOFOL;  Surgeon: Jonathon Bellows, MD;  Location: [facility] ENDOSCOPY;  Service: Gastroenterology;  Laterality: N/A;   I & D EXTREMITY N/A 11/18/2018   Procedure: EVACUATION OF LUMBAR SEROMA/HEMATOMA;  Surgeon: Marybelle Killings, MD;  Location: [place];  Service: Orthopedics;  Laterality: N/A;   LAMINECTOMY  1996   LUMBAR LAMINECTOMY N/A 10/30/2018   Procedure: left L3 & L4 hemilaminectomy, removal of herniated nucleus pulposus;  Surgeon: Marybelle Killings, MD;  Location: [facility];  Service: Orthopedics;  Laterality: N/A;   TOTAL KNEE ARTHROPLASTY Left 02/26/2019   Procedure: LEFT TOTAL KNEE ARTHROPLASTY-CEMENTED;  Surgeon: Marybelle Killings, MD;  Location: [place];  Service: Orthopedics;  Laterality: Left;   WRIST SURGERY Right     OB History   No obstetric history on file.      Home Medications    Prior to Admission medications   Medication Sig Start Date End Date Taking? Authorizing Provider  albuterol (VENTOLIN HFA) 108 (90 Base) MCG/ACT inhaler Inhale 2 puffs into the lungs every 4 (four) hours as needed for wheezing or shortness of breath. 01/16/20   Chrismon, Vickki Muff, PA-C  amoxicillin-clavulanate (AUGMENTIN) 875-125 MG tablet Take 1 tablet by mouth 2 (  two) times daily. 02/18/20   Flinchum, Kelby Aline, FNP  aspirin EC 325 MG EC tablet Take 1 tablet (325 mg total) by mouth daily with breakfast. 03/01/19   Lanae Crumbly, PA-C  benzonatate (TESSALON) 200 MG capsule Take 200 mg by mouth 3 (three) times daily as needed. 10/10/20   [provider]  diazepam (VALIUM) 5 MG tablet TAKE ONE TABLET BY MOUTH TWICE DAILY AS NEEDED FOR ANXIETY OR AGITATION 01/16/20   Chrismon, Simona Huh E, PA-C  gabapentin (NEURONTIN) 300 MG capsule TAKE 1 CAPSULE BY MOUTH THREE TIMES DAILY 12/18/19   Chrismon, Vickki Muff, PA-C  HYDROcodone  bit-homatropine (HYDROMET) 5-1.5 MG/5ML syrup Hydromet 5 mg-1.5 mg/5 mL oral syrup    [provider]  meloxicam (MOBIC) 7.5 MG tablet TAKE 1 TABLET BY MOUTH TWICE DAILY 06/22/20   Chrismon, Vickki Muff, PA-C  methocarbamol (ROBAXIN) 500 MG tablet Take 1 tablet (500 mg total) by mouth every 6 (six) hours as needed for muscle spasms. Patient not taking: No sig reported 02/28/19   Lanae Crumbly, PA-C  neomycin-polymyxin-hydrocortisone (CORTISPORIN) OTIC solution Place 4 drops into the left ear 4 (four) times daily. 02/18/20   Flinchum, Kelby Aline, FNP  niacin 500 MG tablet Take 500 mg by mouth daily. Patient not taking: No sig reported    [provider]  predniSONE (STERAPRED UNI-PAK 21 TAB) 10 MG (21) TBPK tablet PO: Take 6 tablets on day 1:Take 5 tablets day 2:Take 4 tablets day 3: Take 3 tablets day 4:Take 2 tablets day five: 5 Take 1 tablet day 6 02/18/20   Flinchum, Kelby Aline, FNP  sertraline (ZOLOFT) 100 MG tablet TAKE ONE TABLET AT BEDTIME 02/12/20   Chrismon, Vickki Muff, PA-C    Family History Family History  Problem Relation Age of Onset   Lung cancer Father    Healthy Brother    Aneurysm Maternal Grandmother    Breast cancer Maternal Grandmother        mat great gm   Lung cancer Maternal Grandfather    Coronary artery disease Paternal Grandmother    Breast cancer Paternal Grandmother    Diabetes Mother    Hypertension Mother     Social History Social History   Tobacco Use   Smoking status: Every Day    Packs/day: 0.25    Years: 20.00    Pack years: 5.00    Types: Cigarettes   Smokeless tobacco: Never  Vaping Use   Vaping Use: Never used  Substance Use Topics   Alcohol use: Yes    Alcohol/week: 6.0 standard drinks    Types: 6 Cans of beer per week    Comment: none last hrs   Drug use: Yes    Frequency: 7.0 times per week    Types: Marijuana     Allergies   Sulfa antibiotics   Review of Systems Review of Systems  Constitutional:  Negative for  chills and fever.  HENT:  Positive for sore throat. Negative for ear pain.   Respiratory:  Positive for cough and shortness of breath.   Cardiovascular:  Negative for chest pain and palpitations.  Gastrointestinal:  Positive for abdominal pain and diarrhea. Negative for vomiting.  Genitourinary:  Negative for dysuria and hematuria.  Skin:  Negative for color change and rash.  All other systems reviewed and are negative.   Physical Exam Triage Vital Signs ED Triage Vitals  Enc Vitals Group     BP      Pulse  Resp      Temp      Temp src      SpO2      Weight      Height      Head Circumference      Peak Flow      Pain Score      Pain Loc      Pain Edu?      Excl. in [place]?    No data found.  Updated Vital Signs BP 125/82 (BP Location: Left Arm)   Pulse 98   Temp 98.2 F (36.8 C) (Oral)   Resp (!) 24   LMP 01/31/1999   SpO2 92%   Visual Acuity Right Eye Distance:   Left Eye Distance:   Bilateral Distance:    Right Eye Near:   Left Eye Near:    Bilateral Near:     Physical Exam Vitals and nursing note reviewed.  Constitutional:      General: She is not in acute distress.    Appearance: She is well-developed. She is ill-appearing.  HENT:     Head: Normocephalic and atraumatic.     Mouth/Throat:     Mouth: Mucous membranes are moist.     Pharynx: Oropharynx is clear.  Eyes:     Conjunctiva/sclera: Conjunctivae normal.  Cardiovascular:     Rate and Rhythm: Normal rate and regular rhythm.     Heart sounds: Normal heart sounds.  Pulmonary:     Effort: Pulmonary effort is normal. No respiratory distress.     Breath sounds: Normal breath sounds.  Abdominal:     General: Bowel sounds are normal.     Palpations: Abdomen is soft.     Tenderness: There is abdominal tenderness in the right lower quadrant and left lower quadrant. There is no guarding or rebound.  Musculoskeletal:     Cervical back: Neck supple.  Skin:    General: Skin is warm and dry.   Neurological:     General: No focal deficit present.     Mental Status: She is alert and oriented to person, place, and time.     Gait: Gait normal.  Psychiatric:        Mood and Affect: Mood normal.        Behavior: Behavior normal.     UC Treatments / Results  Labs (all labs ordered are listed, but only abnormal results are displayed) Labs Reviewed - No data to display  EKG   Radiology No results found.  Procedures Procedures (including critical care time)  Medications Ordered in UC Medications - No data to display  Initial Impression / Assessment and Plan / UC Course  I have reviewed the triage vital signs and the nursing notes.  Pertinent labs & imaging results that were available during my care of the patient were reviewed by me and considered in my medical decision making (see chart for details).   Shortness of breath, Lower abdominal pain.  Patient's O2 sat is 91 to 92% on room air.  She states she feels short of breath.  Her lower abdomen is tender to palpation.  I discussed with patient that I feel she needs a higher level of care.  She declines transport to the ED by EMS.  She states she is stable to transport herself.  She agrees to go to the ED.   Final Clinical Impressions(s) / UC Diagnoses   Final diagnoses:  Shortness of breath  Lower abdominal pain     Discharge  Instructions      Go to the Emergency Department for evaluation of your shortness of breath and abdominal pain.         ED Prescriptions   None    PDMP not reviewed this encounter.   Sharion Balloon, NP 10/12/20 [phone]

## 2020-10-12 NOTE — ED Provider Notes (Signed)
[facility] Emergency Department Provider Note   ____________________________________________   Event Date/Time   First MD Initiated Contact with Patient 10/12/20 1017     (approximate)  I have reviewed the triage vital signs and the nursing notes.   HISTORY  Chief Complaint Shortness of Breath, Sore Throat, and Abdominal Pain    HPI Emily Bender is a 55 y.o. female who presents for cough, rhinorrhea, shortness of breath, and abdominal pain  LOCATION: Chest and abdomen DURATION: 4 days TIMING: Worsening since onset SEVERITY: Moderate QUALITY: Shortness of breath and aching/throbbing abdominal pain CONTEXT: Patient had shortness of breath, cough, rhinorrhea, and upper respiratory infectious symptoms over the last 4 days with aching abdominal pain that began approximately 24 hours prior to arrival after she had a fit of coughing MODIFYING FACTORS: Abdominal pain worse with any movement or palpation over the area ASSOCIATED SYMPTOMS: Cough productive of white sputum   Per medical record review history of long-term tobacco abuse and albuterol prescription in the past for wheezing but no documented diagnosis of COPD          Past Medical History:  Diagnosis Date   Anxiety    Arthritis    Depression    Hyperlipidemia    Lumbar stenosis    Sleep apnea    Waiting for Cpap    Patient Active Problem List   Diagnosis Date Noted   Non-recurrent acute serous otitis media of right ear 02/18/2020   Otitis externa 02/18/2020   Seasonal allergic rhinitis 02/18/2020   S/P total knee arthroplasty, left 03/12/2019   Arthritis of left knee 02/26/2019   Wound infection 11/18/2018   Lumbar stenosis 10/30/2018   Herniated nucleus pulposus, lumbar 10/30/2018   Spinal stenosis of lumbar region 10/16/2018   Lumbar herniated disc 10/14/2018   Alcohol drinker 05/20/2015   Anxiety and depression 05/20/2015   History of artificial joint 05/20/2015   HLD  (hyperlipidemia) 05/20/2015   Avitaminosis D 05/20/2015   Chronic cough 01/20/2014   Adiposity 01/18/2012   Irregular bleeding 11/27/2006   Tobacco use 06/23/2005    Past Surgical History:  Procedure Laterality Date   ANKLE SURGERY  38/25/0539   x3   APPLICATION OF WOUND VAC N/A 11/18/2018   Procedure: Application Of Wound Vac;  Surgeon: Marybelle Killings, MD;  Location: [place];  Service: Orthopedics;  Laterality: N/A;   BACK SURGERY     BREAST BIOPSY Left 1990's   neg   CHOLECYSTECTOMY  2000   COLONOSCOPY WITH PROPOFOL N/A 01/31/2019   Procedure: COLONOSCOPY WITH PROPOFOL;  Surgeon: Jonathon Bellows, MD;  Location: [facility] ENDOSCOPY;  Service: Gastroenterology;  Laterality: N/A;   I & D EXTREMITY N/A 11/18/2018   Procedure: EVACUATION OF LUMBAR SEROMA/HEMATOMA;  Surgeon: Marybelle Killings, MD;  Location: [place];  Service: Orthopedics;  Laterality: N/A;   LAMINECTOMY  1996   LUMBAR LAMINECTOMY N/A 10/30/2018   Procedure: left L3 & L4 hemilaminectomy, removal of herniated nucleus pulposus;  Surgeon: Marybelle Killings, MD;  Location: [place];  Service: Orthopedics;  Laterality: N/A;   TOTAL KNEE ARTHROPLASTY Left 02/26/2019   Procedure: LEFT TOTAL KNEE ARTHROPLASTY-CEMENTED;  Surgeon: Marybelle Killings, MD;  Location: [place];  Service: Orthopedics;  Laterality: Left;   WRIST SURGERY Right     Prior to Admission medications   Medication Sig Start Date End Date Taking? Authorizing Provider  albuterol (PROVENTIL) (2.5 MG/3ML) 0.083% nebulizer solution Take 3 mLs (2.5 mg total) by nebulization every 4 (four)  hours as needed for wheezing or shortness of breath. 10/12/20 10/12/21 Yes Jesi Jurgens, Vista Lawman, MD  albuterol (VENTOLIN HFA) 108 (90 Base) MCG/ACT inhaler Inhale 2 puffs into the lungs every 6 (six) hours as needed for wheezing or shortness of breath. 10/12/20  Yes Pravin Perezperez, Vista Lawman, MD  methylPREDNISolone (MEDROL DOSEPAK) 4 MG TBPK tablet Follow instructions in package 10/12/20  Yes Avereigh Spainhower, Vista Lawman, MD  albuterol (VENTOLIN  HFA) 108 (90 Base) MCG/ACT inhaler Inhale 2 puffs into the lungs every 4 (four) hours as needed for wheezing or shortness of breath. 01/16/20   Chrismon, Vickki Muff, PA-C  amoxicillin-clavulanate (AUGMENTIN) 875-125 MG tablet Take 1 tablet by mouth 2 (two) times daily. 02/18/20   Flinchum, Kelby Aline, FNP  aspirin EC 325 MG EC tablet Take 1 tablet (325 mg total) by mouth daily with breakfast. 03/01/19   Lanae Crumbly, PA-C  benzonatate (TESSALON) 200 MG capsule Take 200 mg by mouth 3 (three) times daily as needed. 10/10/20   [provider]  diazepam (VALIUM) 5 MG tablet TAKE ONE TABLET BY MOUTH TWICE DAILY AS NEEDED FOR ANXIETY OR AGITATION 01/16/20   Chrismon, Simona Huh E, PA-C  gabapentin (NEURONTIN) 300 MG capsule TAKE 1 CAPSULE BY MOUTH THREE TIMES DAILY 12/18/19   Chrismon, Vickki Muff, PA-C  HYDROcodone bit-homatropine (HYDROMET) 5-1.5 MG/5ML syrup Hydromet 5 mg-1.5 mg/5 mL oral syrup    [provider]  meloxicam (MOBIC) 7.5 MG tablet TAKE 1 TABLET BY MOUTH TWICE DAILY 06/22/20   Chrismon, Vickki Muff, PA-C  methocarbamol (ROBAXIN) 500 MG tablet Take 1 tablet (500 mg total) by mouth every 6 (six) hours as needed for muscle spasms. Patient not taking: No sig reported 02/28/19   Lanae Crumbly, PA-C  neomycin-polymyxin-hydrocortisone (CORTISPORIN) OTIC solution Place 4 drops into the left ear 4 (four) times daily. 02/18/20   Flinchum, Kelby Aline, FNP  niacin 500 MG tablet Take 500 mg by mouth daily. Patient not taking: No sig reported    [provider]  predniSONE (STERAPRED UNI-PAK 21 TAB) 10 MG (21) TBPK tablet PO: Take 6 tablets on day 1:Take 5 tablets day 2:Take 4 tablets day 3: Take 3 tablets day 4:Take 2 tablets day five: 5 Take 1 tablet day 6 02/18/20   Flinchum, Kelby Aline, FNP  sertraline (ZOLOFT) 100 MG tablet TAKE ONE TABLET AT BEDTIME 02/12/20   Chrismon, Vickki Muff, PA-C    Allergies Sulfa antibiotics  Family History  Problem Relation Age of Onset   Lung cancer Father     Healthy Brother    Aneurysm Maternal Grandmother    Breast cancer Maternal Grandmother        mat great gm   Lung cancer Maternal Grandfather    Coronary artery disease Paternal Grandmother    Breast cancer Paternal Grandmother    Diabetes Mother    Hypertension Mother     Social History Social History   Tobacco Use   Smoking status: Every Day    Packs/day: 0.25    Years: 20.00    Pack years: 5.00    Types: Cigarettes   Smokeless tobacco: Never  Vaping Use   Vaping Use: Never used  Substance Use Topics   Alcohol use: Yes    Alcohol/week: 6.0 standard drinks    Types: 6 Cans of beer per week    Comment: none last hrs   Drug use: Yes    Frequency: 7.0 times per week    Types: Marijuana    Review of Systems Constitutional:  No fever/chills Eyes: No visual changes. ENT: Endorses sore throat. Cardiovascular: Denies chest pain. Respiratory: Endorses shortness of breath. Gastrointestinal: Endorses abdominal pain.  No nausea, no vomiting.  No diarrhea. Genitourinary: Negative for dysuria. Musculoskeletal: Negative for acute arthralgias Skin: Negative for rash. Neurological: Negative for headaches, weakness/numbness/paresthesias in any extremity Psychiatric: Negative for suicidal ideation/homicidal ideation   ____________________________________________   PHYSICAL EXAM:  VITAL SIGNS: ED Triage Vitals  Enc Vitals Group     BP 10/12/20 1013 (!) 152/79     Pulse Rate 10/12/20 1013 95     Resp 10/12/20 1013 (!) 24     Temp 10/12/20 1013 98.3 F (36.8 C)     Temp Source 10/12/20 1013 Oral     SpO2 10/12/20 1013 93 %     Weight 10/12/20 1011 230 lb (104.3 kg)     Height 10/12/20 1011 5\' 7"  (1.702 m)     Head Circumference --      Peak Flow --      Pain Score 10/12/20 1011 10     Pain Loc --      Pain Edu? --      Excl. in [place]? --    Constitutional: Alert and oriented. Well appearing and in no acute distress. Eyes: Conjunctivae are normal. PERRL. Head:  Atraumatic. Nose: No congestion/rhinnorhea. Mouth/Throat: Mucous membranes are moist. Neck: No stridor Cardiovascular: Grossly normal heart sounds.  Good peripheral circulation. Respiratory: Increased respiratory effort with pursed lip breathing and inspiratory and expiratory wheezes appreciated over bilateral lung fields.  No retractions. Gastrointestinal: Soft and tender to palpation over the left lower quadrant. No distention. Musculoskeletal: No obvious deformities Neurologic:  Normal speech and language. No gross focal neurologic deficits are appreciated. Skin:  Skin is warm and dry. No rash noted. Psychiatric: Mood and affect are normal. Speech and behavior are normal.  ____________________________________________   LABS (all labs ordered are listed, but only abnormal results are displayed)  Labs Reviewed  COMPREHENSIVE METABOLIC PANEL - Abnormal; Notable for the following components:      Result Value   Sodium 132 (*)    Glucose, Bld 153 (*)    All other components within normal limits  CBC - Abnormal; Notable for the following components:   Hemoglobin 15.3 (*)    All other components within normal limits  LIPASE, BLOOD  URINALYSIS, COMPLETE (UACMP) WITH MICROSCOPIC  POC SARS CORONAVIRUS 2 AG -  ED   ____________________________________________  EKG  ED ECG REPORT I, Naaman Plummer, the attending physician, personally viewed and interpreted this ECG.  Date: 10/12/2020 EKG Time: 1010 Rate: 97 Rhythm: normal sinus rhythm QRS Axis: normal Intervals: normal ST/T Wave abnormalities: normal Narrative Interpretation: no evidence of acute ischemia  ____________________________________________  RADIOLOGY  ED MD interpretation: CT of the abdomen and pelvis without IV contrast shows a 9 x 6 x 4 cm acute left rectus hematoma  Official radiology report(s): CT Abdomen Pelvis Wo Contrast  Result Date: 10/12/2020 CLINICAL DATA:  Left upper quadrant abdominal pain.  Shortness of breath and cough. Diarrhea. EXAM: CT ABDOMEN AND PELVIS WITHOUT CONTRAST TECHNIQUE: Multidetector CT imaging of the abdomen and pelvis was performed following the standard protocol without IV contrast. COMPARISON:  10/04/2012 FINDINGS: Lower chest: No focal or active process. Hepatobiliary: Previous cholecystectomy. Liver parenchyma appears unremarkable without contrast. Pancreas: Normal Spleen: Normal Adrenals/Urinary Tract: Left adrenal gland is normal. Right adrenal gland contains a chronic low-density adenoma now measuring 4 cm in diameter. At the time of the comparison study, maximal  dimension was similar. Kidneys are normal. Bladder is normal. Stomach/Bowel: Stomach and small intestine are normal. Appendix is normal. Diverticulosis of the left colon but without diverticulitis. Vascular/Lymphatic: Aortic atherosclerosis. No aneurysm. IVC is normal. No retroperitoneal adenopathy. Reproductive: Normal Other: No free fluid or air. Musculoskeletal: Acute hemorrhage within the left rectus muscle. The hematoma is of moderate size, measuring approximately 9 x 6 x 4 cm. No evidence of intraperitoneal or subcutaneous extension. Ordinary lower lumbar degenerative changes are present. IMPRESSION: Acute left rectus hematoma measuring approximately 9 x 6 x 4 cm. No evidence of intraperitoneal extension or subcutaneous extension. Bleeding contained by the rectus sheath. Chronic right adrenal adenoma measuring up to 4 cm, not significantly changed since 2014. Electronically Signed   By: Nelson Chimes M.D.   On: 10/12/2020 12:51    ____________________________________________   PROCEDURES  Procedure(s) performed (including Critical Care):  .1-3 Lead EKG Interpretation  Date/Time: 10/12/2020 2:05 PM Performed by: Naaman Plummer, MD Authorized by: Naaman Plummer, MD     Interpretation: normal     ECG rate:  91   ECG rate assessment: normal     Rhythm: sinus rhythm     Ectopy: none     Conduction:  normal     ____________________________________________   INITIAL IMPRESSION / ASSESSMENT AND PLAN / ED COURSE  As part of my medical decision making, I reviewed the following data within the electronic medical record, if available:  Nursing notes reviewed and incorporated, Labs reviewed, EKG interpreted, Old chart reviewed, Radiograph reviewed and Notes from prior ED visits reviewed and incorporated        The patient appears to be suffering from a moderate exacerbation of COPD.  Based on the history, exam, CXR/EKG, and further workup I dont suspect any other emergent cause of this presentation, such as pneumonia, acute coronary syndrome, congestive heart failure, pulmonary embolism, or pneumothorax.  ED Interventions: bronchodilators, steroids, antibiotics, reassess  1322 Reassessment: After treatment, the patients shortness of breath is resolved, and their lung exam has returned to baseline. They are comfortable and want to go home. CT of the abdomen and pelvis shows an acute left rectus hematoma which likely explains patient's abdominal pain.  Assume this injury occurred from coughing. Rx: Steroids, Antibiotics, Albuterol Disposition: Discharge home with SRP. PCP follow up recommended in next 48hours.      ____________________________________________   FINAL CLINICAL IMPRESSION(S) / ED DIAGNOSES  Final diagnoses:  Shortness of breath  COPD exacerbation ([place])  Upper respiratory tract infection, unspecified type     ED Discharge Orders          Ordered    albuterol (PROVENTIL) (2.5 MG/3ML) 0.083% nebulizer solution  Every 4 hours PRN        10/12/20 1323    albuterol (VENTOLIN HFA) 108 (90 Base) MCG/ACT inhaler  Every 6 hours PRN        10/12/20 1323    methylPREDNISolone (MEDROL DOSEPAK) 4 MG TBPK tablet        10/12/20 1323             Note:  This document was prepared using Dragon voice recognition software and may include unintentional dictation  errors.    Naaman Plummer, MD 10/12/20 [phone]

## 2020-10-12 NOTE — Discharge Instructions (Addendum)
Go to the Emergency Department for evaluation of your shortness of breath and abdominal pain.

## 2020-10-29 ENCOUNTER — Other Ambulatory Visit: Payer: Self-pay

## 2020-10-29 ENCOUNTER — Encounter: Payer: Self-pay | Admitting: Family Medicine

## 2020-10-29 ENCOUNTER — Ambulatory Visit: Payer: BC Managed Care – PPO | Admitting: Family Medicine

## 2020-10-29 VITALS — BP 143/91 | HR 79 | Temp 97.9°F | Ht 67.0 in | Wt 238.6 lb

## 2020-10-29 DIAGNOSIS — F419 Anxiety disorder, unspecified: Secondary | ICD-10-CM

## 2020-10-29 DIAGNOSIS — J441 Chronic obstructive pulmonary disease with (acute) exacerbation: Secondary | ICD-10-CM | POA: Diagnosis not present

## 2020-10-29 DIAGNOSIS — F32A Depression, unspecified: Secondary | ICD-10-CM | POA: Diagnosis not present

## 2020-10-29 MED ORDER — DIAZEPAM 5 MG PO TABS
ORAL_TABLET | ORAL | 0 refills | Status: AC
Start: 2020-10-29 — End: ?

## 2020-10-29 MED ORDER — TRELEGY ELLIPTA 200-62.5-25 MCG/INH IN AEPB: 1.0000 | INHALATION_SPRAY | Freq: Every day | RESPIRATORY_TRACT | 0 refills | Status: AC

## 2020-10-29 MED ORDER — AZITHROMYCIN 250 MG PO TABS: ORAL_TABLET | ORAL | 0 refills | Status: AC

## 2020-10-29 NOTE — Progress Notes (Signed)
Established patient visit   Patient: Emily Bender   DOB: 01/30/1966   55 y.o. Female  MRN: [id number] Visit Date: 10/29/2020  Today's healthcare provider: Vernie Murders, PA-C   No chief complaint on file.  Subjective    HPI  Follow up ER visit  Patient was seen in ER for shortness of breath, COPD and URI on 10/12/20. She was treated for shortness of breath. Treatment for this included prednisone and Duoneb. She reports excellent compliance with treatment. She reports this condition is Improved.  -----------------------------------------------------------------------------------------   Patient Active Problem List   Diagnosis Date Noted   Non-recurrent acute serous otitis media of right ear 02/18/2020   Otitis externa 02/18/2020   Seasonal allergic rhinitis 02/18/2020   S/P total knee arthroplasty, left 03/12/2019   Arthritis of left knee 02/26/2019   Wound infection 11/18/2018   Lumbar stenosis 10/30/2018   Herniated nucleus pulposus, lumbar 10/30/2018   Spinal stenosis of lumbar region 10/16/2018   Lumbar herniated disc 10/14/2018   Alcohol drinker 05/20/2015   Anxiety and depression 05/20/2015   History of artificial joint 05/20/2015   HLD (hyperlipidemia) 05/20/2015   Avitaminosis D 05/20/2015   Chronic cough 01/20/2014   Adiposity 01/18/2012   Irregular bleeding 11/27/2006   Tobacco use 06/23/2005   Past Medical History:  Diagnosis Date   Anxiety    Arthritis    Depression    Hyperlipidemia    Lumbar stenosis    Sleep apnea    Waiting for Cpap   Past Surgical History:  Procedure Laterality Date   ANKLE SURGERY  50/53/9767   x3   APPLICATION OF WOUND VAC N/A 11/18/2018   Procedure: Application Of Wound Vac;  Surgeon: Marybelle Killings, MD;  Location: [place];  Service: Orthopedics;  Laterality: N/A;   BACK SURGERY     BREAST BIOPSY Left 1990's   neg   CHOLECYSTECTOMY  2000   COLONOSCOPY WITH PROPOFOL N/A 01/31/2019   Procedure: COLONOSCOPY WITH  PROPOFOL;  Surgeon: Jonathon Bellows, MD;  Location: [facility] ENDOSCOPY;  Service: Gastroenterology;  Laterality: N/A;   I & D EXTREMITY N/A 11/18/2018   Procedure: EVACUATION OF LUMBAR SEROMA/HEMATOMA;  Surgeon: Marybelle Killings, MD;  Location: [place];  Service: Orthopedics;  Laterality: N/A;   LAMINECTOMY  1996   LUMBAR LAMINECTOMY N/A 10/30/2018   Procedure: left L3 & L4 hemilaminectomy, removal of herniated nucleus pulposus;  Surgeon: Marybelle Killings, MD;  Location: [place];  Service: Orthopedics;  Laterality: N/A;   TOTAL KNEE ARTHROPLASTY Left 02/26/2019   Procedure: LEFT TOTAL KNEE ARTHROPLASTY-CEMENTED;  Surgeon: Marybelle Killings, MD;  Location: [place];  Service: Orthopedics;  Laterality: Left;   WRIST SURGERY Right    Family History  Problem Relation Age of Onset   Lung cancer Father    Healthy Brother    Aneurysm Maternal Grandmother    Breast cancer Maternal Grandmother        mat great gm   Lung cancer Maternal Grandfather    Coronary artery disease Paternal Grandmother    Breast cancer Paternal Grandmother    Diabetes Mother    Hypertension Mother     Social History   Tobacco Use   Smoking status: Every Day    Packs/day: 0.25    Years: 20.00    Pack years: 5.00    Types: Cigarettes   Smokeless tobacco: Never  Vaping Use   Vaping Use: Never used  Substance Use Topics   Alcohol use: Yes  Alcohol/week: 6.0 standard drinks    Types: 6 Cans of beer per week    Comment: none last hrs   Drug use: Yes    Frequency: 7.0 times per week    Types: Marijuana   Allergies  Allergen Reactions   Sulfa Antibiotics Hives   Medications: Outpatient Medications Prior to Visit  Medication Sig   albuterol (PROVENTIL) (2.5 MG/3ML) 0.083% nebulizer solution Take 3 mLs (2.5 mg total) by nebulization every 4 (four) hours as needed for wheezing or shortness of breath.   albuterol (VENTOLIN HFA) 108 (90 Base) MCG/ACT inhaler Inhale 2 puffs into the lungs every 4 (four) hours as needed for wheezing or  shortness of breath.   albuterol (VENTOLIN HFA) 108 (90 Base) MCG/ACT inhaler Inhale 2 puffs into the lungs every 6 (six) hours as needed for wheezing or shortness of breath.   amoxicillin-clavulanate (AUGMENTIN) 875-125 MG tablet Take 1 tablet by mouth 2 (two) times daily.   aspirin EC 325 MG EC tablet Take 1 tablet (325 mg total) by mouth daily with breakfast.   benzonatate (TESSALON) 200 MG capsule Take 200 mg by mouth 3 (three) times daily as needed.   diazepam (VALIUM) 5 MG tablet TAKE ONE TABLET BY MOUTH TWICE DAILY AS NEEDED FOR ANXIETY OR AGITATION   gabapentin (NEURONTIN) 300 MG capsule TAKE 1 CAPSULE BY MOUTH THREE TIMES DAILY   HYDROcodone bit-homatropine (HYDROMET) 5-1.5 MG/5ML syrup Hydromet 5 mg-1.5 mg/5 mL oral syrup   meloxicam (MOBIC) 7.5 MG tablet TAKE 1 TABLET BY MOUTH TWICE DAILY   methocarbamol (ROBAXIN) 500 MG tablet Take 1 tablet (500 mg total) by mouth every 6 (six) hours as needed for muscle spasms. (Patient not taking: No sig reported)   methylPREDNISolone (MEDROL DOSEPAK) 4 MG TBPK tablet Follow instructions in package   neomycin-polymyxin-hydrocortisone (CORTISPORIN) OTIC solution Place 4 drops into the left ear 4 (four) times daily.   niacin 500 MG tablet Take 500 mg by mouth daily. (Patient not taking: No sig reported)   predniSONE (STERAPRED UNI-PAK 21 TAB) 10 MG (21) TBPK tablet PO: Take 6 tablets on day 1:Take 5 tablets day 2:Take 4 tablets day 3: Take 3 tablets day 4:Take 2 tablets day five: 5 Take 1 tablet day 6   sertraline (ZOLOFT) 100 MG tablet TAKE ONE TABLET AT BEDTIME   No facility-administered medications prior to visit.    Review of Systems  Constitutional: Negative.   HENT:  Positive for congestion.   Respiratory:  Positive for cough. Negative for shortness of breath and wheezing.   Cardiovascular: Negative.   Gastrointestinal: Negative.   Genitourinary: Negative.   Psychiatric/Behavioral:  Positive for agitation.        Objective      Today's Vitals   10/29/20 1036  BP: (!) 143/91  Pulse: 79  Temp: 97.9 F (36.6 C)  TempSrc: Oral  SpO2: 96%  Weight: 238 lb 9.6 oz (108.2 kg)  Height: 5\' 7"  (1.702 m)   Body mass index is 37.37 kg/m.  LMP 01/31/1999  BP Readings from Last 3 Encounters:  10/12/20 (!) 144/88  10/12/20 125/82  02/18/20 (!) 153/92   Wt Readings from Last 3 Encounters:  10/12/20 230 lb (104.3 kg)  02/18/20 251 lb 3.2 oz (113.9 kg)  01/16/20 247 lb 12.8 oz (112.4 kg)       Physical Exam Constitutional:      General: She is not in acute distress.    Appearance: She is well-developed.  HENT:     Head: Normocephalic  and atraumatic.     Right Ear: Hearing and tympanic membrane normal.     Left Ear: Hearing and tympanic membrane normal.     Nose: Nose normal.  Eyes:     General: Lids are normal. No scleral icterus.       Right eye: No discharge.        Left eye: No discharge.     Conjunctiva/sclera: Conjunctivae normal.  Cardiovascular:     Rate and Rhythm: Normal rate and regular rhythm.     Pulses: Normal pulses.     Heart sounds: Normal heart sounds.  Pulmonary:     Effort: Pulmonary effort is normal. No respiratory distress.     Comments: Slightly distant breath sounds. No rales, wheezes or rhonchi today. Abdominal:     General: Bowel sounds are normal.     Palpations: Abdomen is soft.  Musculoskeletal:        General: Normal range of motion.     Cervical back: Neck supple.  Skin:    Findings: No lesion or rash.  Neurological:     Mental Status: She is alert and oriented to person, place, and time.  Psychiatric:        Mood and Affect: Mood is anxious. Affect is angry.        Speech: Speech normal.        Behavior: Behavior normal.        Thought Content: Thought content normal.      No results found for any visits on 10/29/20.  Assessment & Plan     1. COPD with exacerbation ([place]) Still smoking 1 ppd. Has Albuterol inhaler for wheezing prn. Was seen in the ER on  10-12-20 and treated with prednisone taper, Benzonatate and Augmentin for suspected early perihilar pneumonia on CXR. Still having some cough but no fever or chills. Will give Z-pak and Trelegy. May use Flonase and OTC antihistamine prn PND with sinus congestion. Increase fluids and encouraged to stop smoking (doubt she will). Recheck prn. - azithromycin (ZITHROMAX) 250 MG tablet; Take 2 tablets on day 1, then 1 tablet daily on days 2 through 5  Dispense: 6 tablet; Refill: 0 - Fluticasone-Umeclidin-Vilant (TRELEGY ELLIPTA) 200-62.5-25 MCG/INH AEPB; Inhale 1 puff into the lungs daily.  Dispense: 1 each; Refill: 0  2. Anxiety and depression Occasionally uses Diazepam (30 tablets every 6-12 months) for irritability and anxiety. Will refill medications and recheck in 6 months. - diazepam (VALIUM) 5 MG tablet; TAKE ONE TABLET BY MOUTH TWICE DAILY AS NEEDED FOR ANXIETY OR AGITATION  Dispense: 30 tablet; Refill: 0   No follow-ups on file.      I, Atalaya Zappia, PA-C, have reviewed all documentation for this visit. The documentation on 10/29/20 for the exam, diagnosis, procedures, and orders are all accurate and complete.    Vernie Murders, PA-C  [employer] [phone] (phone) [phone] (fax)  [place]

## 2022-01-13 ENCOUNTER — Ambulatory Visit (INDEPENDENT_AMBULATORY_CARE_PROVIDER_SITE_OTHER): Payer: BLUE CROSS/BLUE SHIELD

## 2022-01-13 ENCOUNTER — Ambulatory Visit
Admission: EM | Admit: 2022-01-13 | Discharge: 2022-01-13 | Disposition: A | Discharge status: Home / Self Care | Payer: BLUE CROSS/BLUE SHIELD

## 2022-01-13 DIAGNOSIS — R0781 Pleurodynia: Secondary | ICD-10-CM
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
## 2022-01-13 NOTE — Discharge Instructions (Addendum)
Follow up here or with your primary care provider if your symptoms are worsening.

## 2022-02-08 ENCOUNTER — Ambulatory Visit (INDEPENDENT_AMBULATORY_CARE_PROVIDER_SITE_OTHER): Payer: BLUE CROSS/BLUE SHIELD

## 2022-02-08 ENCOUNTER — Encounter: Payer: Self-pay | Admitting: Orthopaedic Surgery

## 2022-02-08 ENCOUNTER — Ambulatory Visit: Payer: Self-pay

## 2022-02-08 ENCOUNTER — Ambulatory Visit: Payer: BLUE CROSS/BLUE SHIELD | Admitting: Orthopaedic Surgery

## 2022-02-08 VITALS — BP 147/78 | HR 78 | Ht 67.0 in | Wt 214.0 lb

## 2022-02-08 DIAGNOSIS — M545 Low back pain, unspecified: Secondary | ICD-10-CM | POA: Diagnosis not present

## 2022-02-08 DIAGNOSIS — M25571 Pain in right ankle and joints of right foot: Secondary | ICD-10-CM | POA: Diagnosis not present

## 2022-02-10 NOTE — Progress Notes (Addendum)
Office Visit Note   Patient: Emily Bender           Date of Birth: [birth year]           MRN: [id number] Visit Date: 02/08/2022              Requested by: No referring provider defined for this encounter. PCP: Chrismon, Vickki Muff, PA-C (Inactive)   Assessment & Plan: Visit Diagnoses:  1. Pain in right ankle and joints of right foot   2. Acute midline low back pain, unspecified whether sciatica present     Plan: Discussed patient needs to put a sock on wear Swede-O ankle brace to help support the peroneal tendon.  We discussed that this is likely some tendinopathy and should improve with bracing.  She will try to avoid unlevel surfaces and she can follow-up if she has persistent symptoms.  Follow-Up Instructions: No follow-ups on file.   Orders:  Orders Placed This Encounter  Procedures   XR Ankle Complete Right   XR Lumbar Spine 2-3 Views   XR Foot Complete Right   No orders of the defined types were placed in this encounter.     Procedures: No procedures performed   Clinical Data: No additional findings.   Subjective: Chief Complaint  Patient presents with   Right Foot - Pain, Follow-up   Right Ankle - Follow-up, Pain   Lower Back - Follow-up, Pain    HPI 56 year old female returns with ongoing problems with right lateral ankle pain.  She had back surgery by me with hemilaminectomy L3 on the left and L4 with removal H&P and then reoperation 18 days later for evacuation of the seroma hematoma.  Patient is back to his healed and resolved.   She has had 3 total ankle replacements done of her right ankle with persistent pain and swelling done at [facility]..  States she has pain that feels like a knife laterally and points over the distal peroneal tendon insertion.  1 revision was done for loosening.  diabetes sugars between 110 and 140.  Pain laterally in her foot is worse by the end of the day.  Review of Systems no fever or chills.  No associated bowel or bladder  symptoms.  Positive for persistent swelling right ankle and foot post multiple surgeries.   Objective: Vital Signs: BP (!) 147/78   Pulse 78   Ht '5\' 7"'$  (1.702 m)   Wt 214 lb (97.1 kg)   LMP 01/31/1999   BMI 33.52 kg/m   Physical Exam Constitutional:      Appearance: She is well-developed.  HENT:     Head: Normocephalic.     Right Ear: External ear normal.     Left Ear: External ear normal. There is no impacted cerumen.  Eyes:     Pupils: Pupils are equal, round, and reactive to light.  Neck:     Thyroid: No thyromegaly.     Trachea: No tracheal deviation.  Cardiovascular:     Rate and Rhythm: Normal rate.  Pulmonary:     Effort: Pulmonary effort is normal.  Abdominal:     Palpations: Abdomen is soft.  Musculoskeletal:     Cervical back: No rigidity.  Skin:    General: Skin is warm and dry.  Neurological:     Mental Status: She is alert and oriented to person, place, and time.  Psychiatric:        Behavior: Behavior normal.     Ortho Exam well-healed anterior incision  for total ankle replacement.  Exquisite tenderness over the peroneal tendon no weakness however but it does reproduce her pain with resisted peroneal function updated patient over the peroneal brevis adjacent to the fifth metatarsal insertion site.  Specialty Comments:  No specialty comments available.  Imaging: No results found.   PMFS History: Patient Active Problem List   Diagnosis Date Noted   Non-recurrent acute serous otitis media of right ear 02/18/2020   Otitis externa 02/18/2020   Seasonal allergic rhinitis 02/18/2020   S/P total knee arthroplasty, left 03/12/2019   Arthritis of left knee 02/26/2019   Wound infection 11/18/2018   Lumbar stenosis 10/30/2018   Herniated nucleus pulposus, lumbar 10/30/2018   Spinal stenosis of lumbar region 10/16/2018   Lumbar herniated disc 10/14/2018   Alcohol drinker 05/20/2015   Anxiety and depression 05/20/2015   History of artificial joint  05/20/2015   HLD (hyperlipidemia) 05/20/2015   Avitaminosis D 05/20/2015   Chronic cough 01/20/2014   Adiposity 01/18/2012   Irregular bleeding 11/27/2006   Tobacco use 06/23/2005   Past Medical History:  Diagnosis Date   Anxiety    Arthritis    Depression    Hyperlipidemia    Lumbar stenosis    Sleep apnea    Waiting for Cpap    Family History  Problem Relation Age of Onset   Lung cancer Father    Healthy Brother    Aneurysm Maternal Grandmother    Breast cancer Maternal Grandmother        mat great gm   Lung cancer Maternal Grandfather    Coronary artery disease Paternal Grandmother    Breast cancer Paternal Grandmother    Diabetes Mother    Hypertension Mother     Past Surgical History:  Procedure Laterality Date   ANKLE SURGERY  42/59/5638   x3   APPLICATION OF WOUND VAC N/A 11/18/2018   Procedure: Application Of Wound Vac;  Surgeon: Marybelle Killings, MD;  Location: [place];  Service: Orthopedics;  Laterality: N/A;   BACK SURGERY     BREAST BIOPSY Left 1990's   neg   CHOLECYSTECTOMY  2000   COLONOSCOPY WITH PROPOFOL N/A 01/31/2019   Procedure: COLONOSCOPY WITH PROPOFOL;  Surgeon: Jonathon Bellows, MD;  Location: [facility] ENDOSCOPY;  Service: Gastroenterology;  Laterality: N/A;   I & D EXTREMITY N/A 11/18/2018   Procedure: EVACUATION OF LUMBAR SEROMA/HEMATOMA;  Surgeon: Marybelle Killings, MD;  Location: [place];  Service: Orthopedics;  Laterality: N/A;   LAMINECTOMY  1996   LUMBAR LAMINECTOMY N/A 10/30/2018   Procedure: left L3 & L4 hemilaminectomy, removal of herniated nucleus pulposus;  Surgeon: Marybelle Killings, MD;  Location: [place];  Service: Orthopedics;  Laterality: N/A;   TOTAL KNEE ARTHROPLASTY Left 02/26/2019   Procedure: LEFT TOTAL KNEE ARTHROPLASTY-CEMENTED;  Surgeon: Marybelle Killings, MD;  Location: [place];  Service: Orthopedics;  Laterality: Left;   WRIST SURGERY Right    Social History   Occupational History   Not on file  Tobacco Use   Smoking status: Every Day     Packs/day: 0.25    Years: 20.00    Total pack years: 5.00    Types: Cigarettes   Smokeless tobacco: Never  Vaping Use   Vaping Use: Never used  Substance and Sexual Activity   Alcohol use: Yes    Alcohol/week: 6.0 standard drinks of alcohol    Types: 6 Cans of beer per week    Comment: none last hrs   Drug use: Yes  Frequency: 7.0 times per week    Types: Marijuana   Sexual activity: Not on file

## 2022-05-18 IMAGING — CT CT ABD-PELV W/O CM
2 of 4 series · 17 of 46 positions shown, 19 images · non-contrast
Comparison: 10/04/2012

CLINICAL DATA: Left upper quadrant abdominal pain. Shortness of
breath and cough. Diarrhea.

EXAM:
CT ABDOMEN AND PELVIS WITHOUT CONTRAST
TECHNIQUE: Multidetector CT imaging of the abdomen and pelvis was performed
following the standard protocol without IV contrast.

[Series 2: routine abd/pel wo · axial · 0.84mm/px · z∈[-875,-435]mm · 14 of 96 slices shown, 16 images]
[im 4/96  soft-tissue]
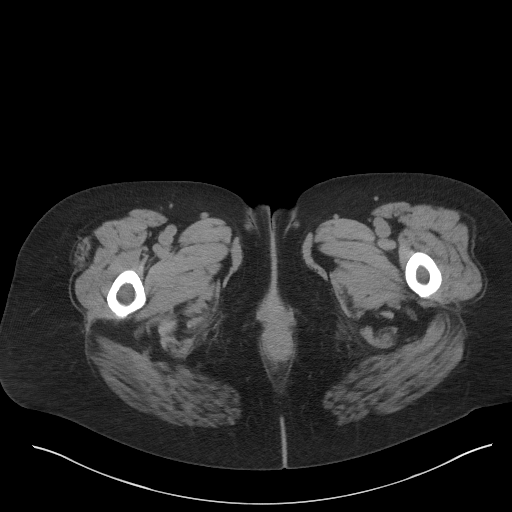
[im 4/96  bone]
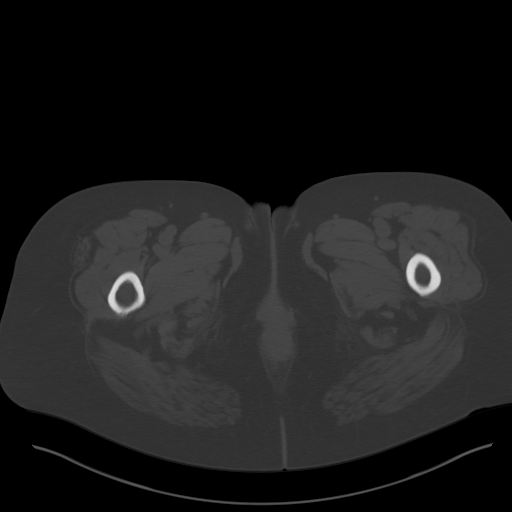
[im 12/96  soft-tissue]
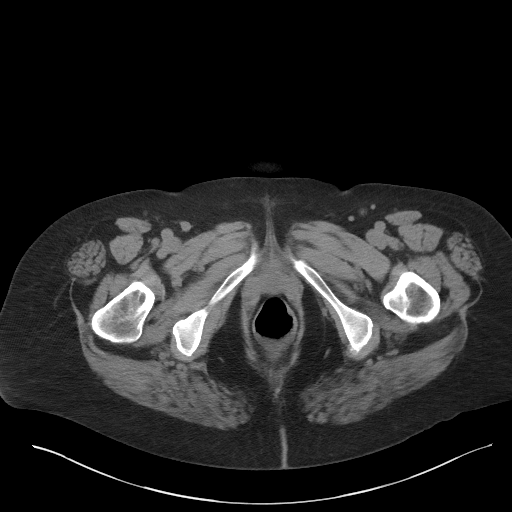
[im 20/96  soft-tissue]
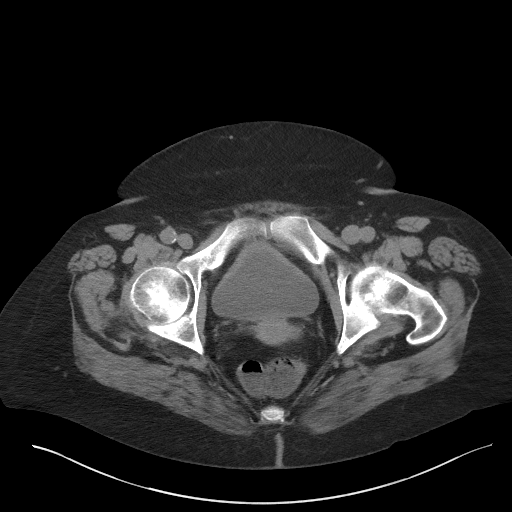
[im 27/96  soft-tissue]
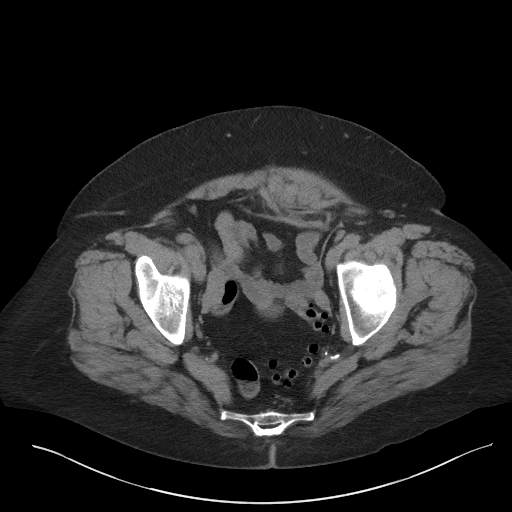
[im 31/96  soft-tissue]
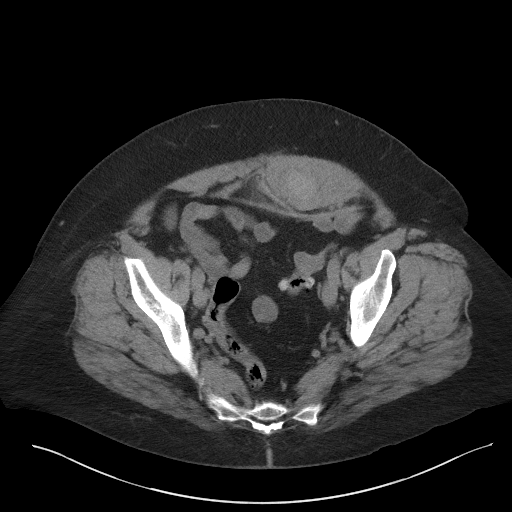
[im 39/96  soft-tissue]
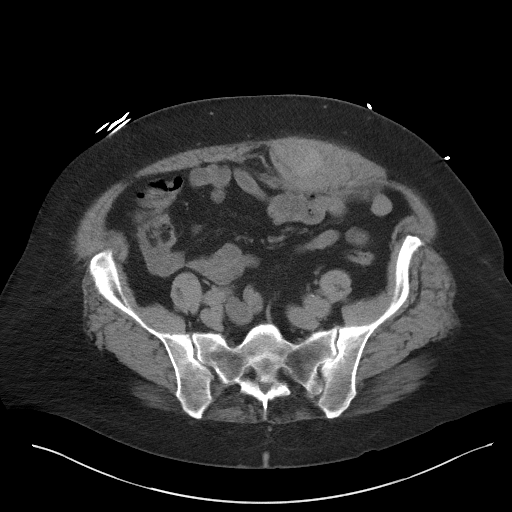
[im 46/96  soft-tissue]
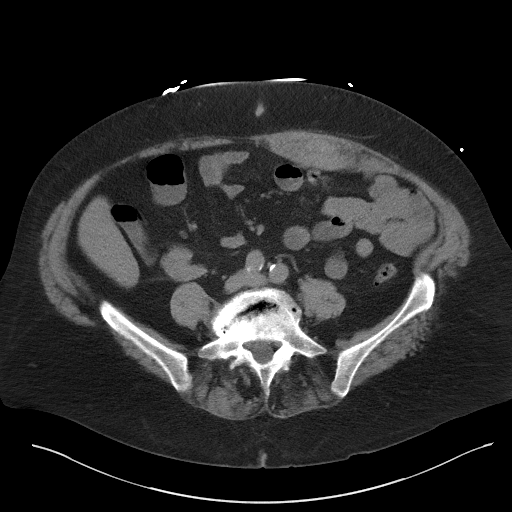
[im 50/96  soft-tissue]
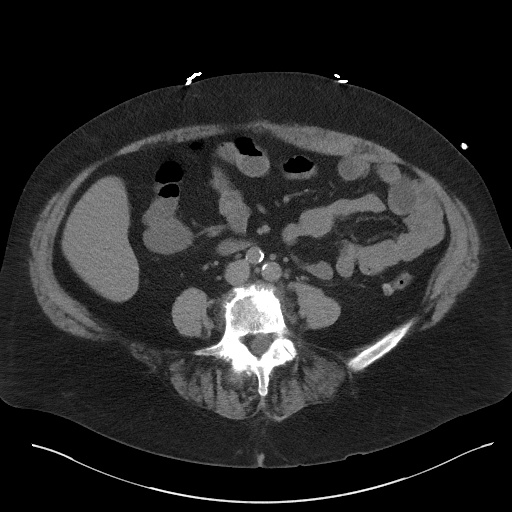
[im 58/96  soft-tissue]
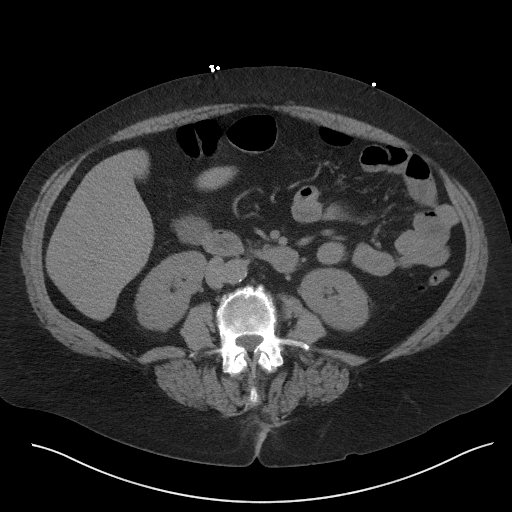
[im 58/96  bone]
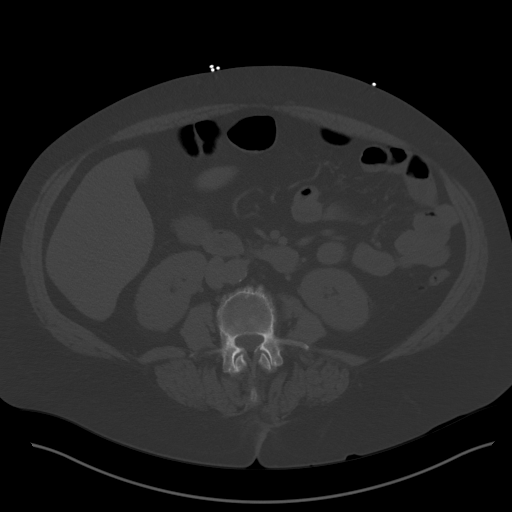
[im 65/96  soft-tissue]
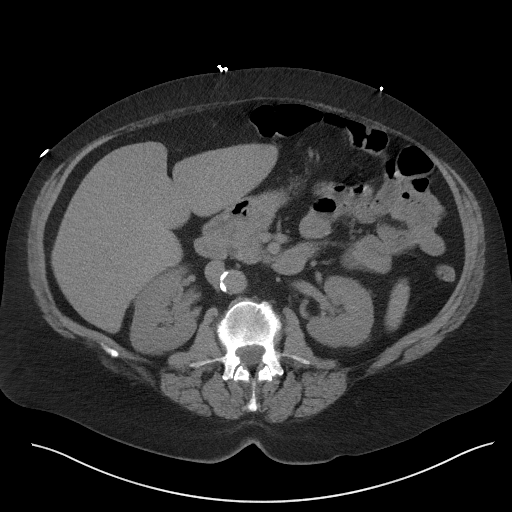
[im 73/96  soft-tissue]
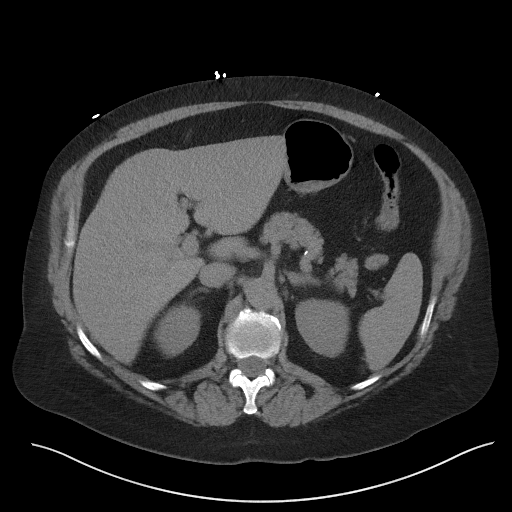
[im 77/96  soft-tissue]
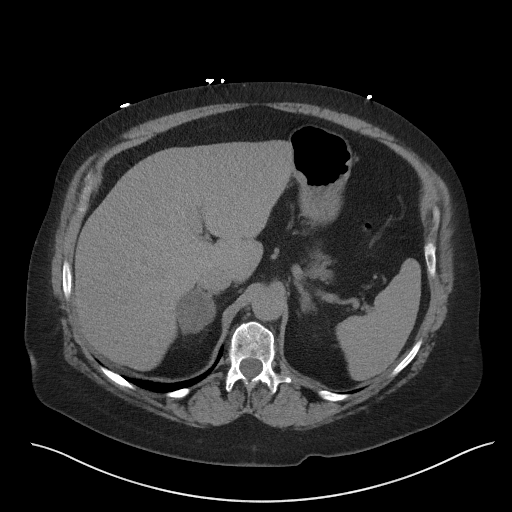
[im 84/96  soft-tissue]
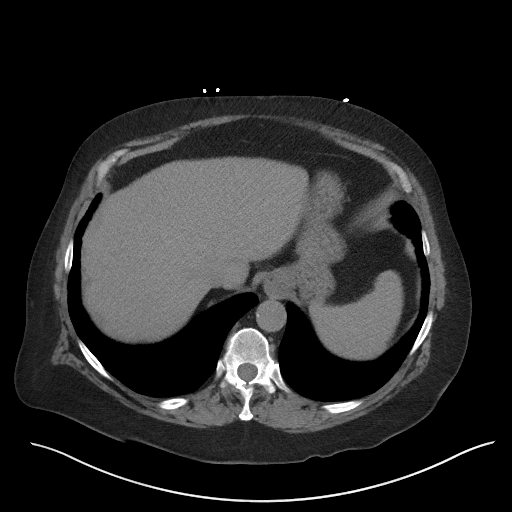
[im 92/96  soft-tissue]
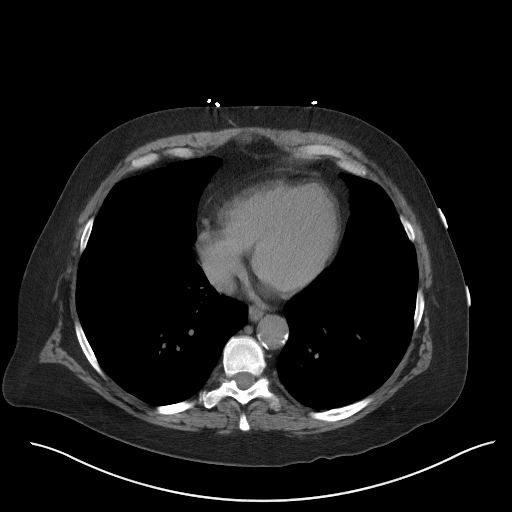

[Series 5: coronal st · coronal · 0.94mm/px · 3 of 110 slices shown]
[im 37/110  soft-tissue]
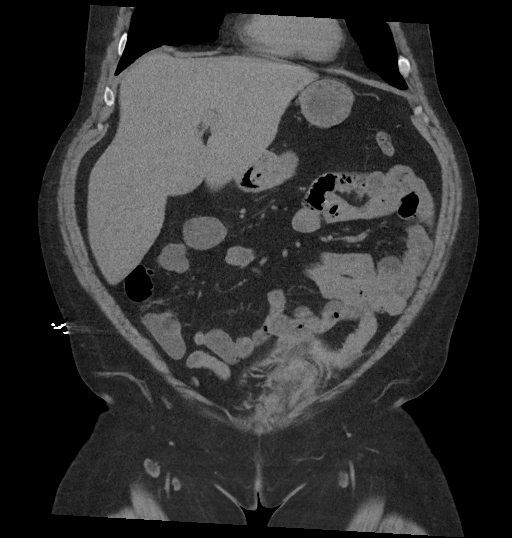
[im 49/110  soft-tissue]
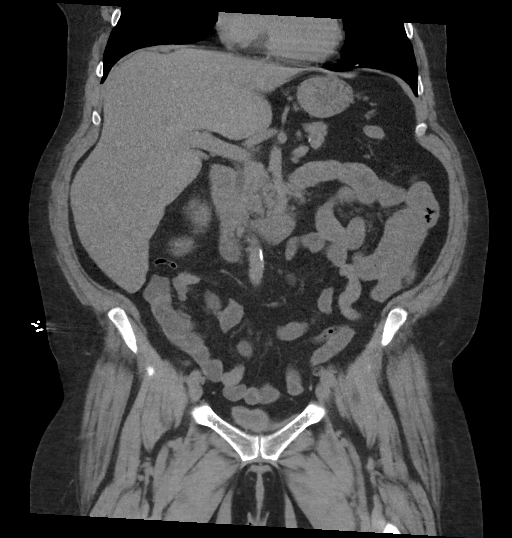
[im 61/110  soft-tissue]
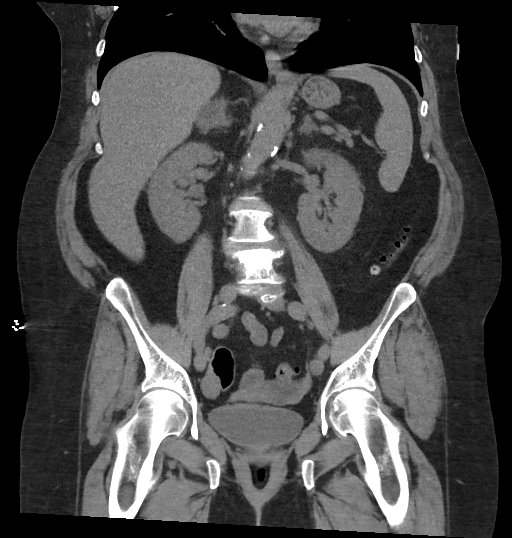

[17 of 46 positions shown; findings below may reference images not displayed]

FINDINGS: Lower chest: No focal or active process.

Hepatobiliary: Previous cholecystectomy. Liver parenchyma appears
unremarkable without contrast.

Pancreas: Normal

Spleen: Normal

Adrenals/Urinary Tract: Left adrenal gland is normal. Right adrenal
gland contains a chronic low-density adenoma now measuring 4 cm in
diameter. At the time of the comparison study, maximal dimension was
similar. Kidneys are normal. Bladder is normal.

Stomach/Bowel: Stomach and small intestine are normal. Appendix is
normal. Diverticulosis of the left colon but without diverticulitis.

Vascular/Lymphatic: Aortic atherosclerosis. No aneurysm. IVC is
normal. No retroperitoneal adenopathy.

Reproductive: Normal

Other: No free fluid or air.

Musculoskeletal: Acute hemorrhage within the left rectus muscle. The
hematoma is of moderate size, measuring approximately 9 x 6 x 4 cm.
No evidence of intraperitoneal or subcutaneous extension. Ordinary
lower lumbar degenerative changes are present.
IMPRESSION: Acute left rectus hematoma measuring approximately 9 x 6 x 4 cm. No
evidence of intraperitoneal extension or subcutaneous extension.
Bleeding contained by the rectus sheath.

Chronic right adrenal adenoma measuring up to 4 cm, not
significantly changed since [DATE].

## 2022-10-17 ENCOUNTER — Encounter: Payer: Self-pay | Admitting: Orthopaedic Surgery

## 2022-10-17 ENCOUNTER — Ambulatory Visit: Payer: BLUE CROSS/BLUE SHIELD | Admitting: Orthopaedic Surgery

## 2022-10-17 VITALS — BP 119/66 | HR 82 | Ht 67.0 in | Wt 204.0 lb

## 2022-10-17 DIAGNOSIS — Z9660 Presence of unspecified orthopedic joint implant: Secondary | ICD-10-CM | POA: Diagnosis not present

## 2022-10-17 DIAGNOSIS — Z96652 Presence of left artificial knee joint: Secondary | ICD-10-CM | POA: Diagnosis not present

## 2022-10-17 NOTE — Progress Notes (Signed)
Office Visit Note   Patient: Emily Bender           Date of Birth: 08-07-1965           MRN: [id number] Visit Date: 10/17/2022              Requested by: Eldred Manges, MD [address] [place],  [place] [address] PCP: Tamsen Roers, PA-C (Inactive)   Assessment & Plan: Visit Diagnoses:  1. S/P total knee arthroplasty, left   2. History of artificial joint     Plan: New Swede-O given show uses chronically help with ankle instability particularly at work.  She has a black boot at home she can use intermittently on the weekends if her ankle is symptomatic.  Previous x-rays few months ago did not show evidence of loosening of the total ankle arthroplasty.  She does have some bone fragments anterior to the joint around and may be some capsule calcification.  Follow-Up Instructions: No follow-ups on file.   Orders:  No orders of the defined types were placed in this encounter.  No orders of the defined types were placed in this encounter.     Procedures: No procedures performed   Clinical Data: No additional findings.   Subjective: Chief Complaint  Patient presents with   Right Ankle - Pain    HPI 57 year old female returns she states she is on her feet 14 hours a day dose 12,000 steps per day with persistent problems with some ankle instability slightly wide-based gait she has had 3 revision surgeries after total ankle arthroplasty on the right ankle with some lateral peroneal tendinopathy.  We recommended a Swede-O she needs a new Swede-O which was provided today.  She continues to have some problems with her back she has been taking gabapentin at night.  Previous left L3 and L4 hemilaminectomy with postop seroma evacuation.  Patient has back symptoms been intermittent.  Ankles been somewhat unstable she has not fallen.  Review of Systems all systems updated unchanged from 02/08/2022.  Objective: Vital Signs: BP 119/66   Pulse 82   Ht 5\' 7"  (1.702 m)   Wt 204  lb (92.5 kg)   LMP 01/31/1999   BMI 31.95 kg/m   Physical Exam Constitutional:      Appearance: She is well-developed.  HENT:     Head: Normocephalic.     Right Ear: External ear normal.     Left Ear: External ear normal. There is no impacted cerumen.  Eyes:     Pupils: Pupils are equal, round, and reactive to light.  Neck:     Thyroid: No thyromegaly.     Trachea: No tracheal deviation.  Cardiovascular:     Rate and Rhythm: Normal rate.  Pulmonary:     Effort: Pulmonary effort is normal.  Abdominal:     Palpations: Abdomen is soft.  Musculoskeletal:     Cervical back: No rigidity.  Skin:    General: Skin is warm and dry.  Neurological:     Mental Status: She is alert and oriented to person, place, and time.  Psychiatric:        Behavior: Behavior normal.     Ortho Exam patient has lateral tenderness over the peroneal tendon.  Slightly wide-based gait.  Well-healed knee arthroplasty incision without swelling good stability.  No pain with hip range of motion.  She has edema of the ankle and foot on the right.  Specialty Comments:  No specialty comments available.  Imaging:  No results found.   PMFS History: Patient Active Problem List   Diagnosis Date Noted   Non-recurrent acute serous otitis media of right ear 02/18/2020   Otitis externa 02/18/2020   Seasonal allergic rhinitis 02/18/2020   S/P total knee arthroplasty, left 03/12/2019   Arthritis of left knee 02/26/2019   Wound infection 11/18/2018   Lumbar stenosis 10/30/2018   Herniated nucleus pulposus, lumbar 10/30/2018   Spinal stenosis of lumbar region 10/16/2018   Lumbar herniated disc 10/14/2018   Alcohol drinker 05/20/2015   Anxiety and depression 05/20/2015   History of artificial joint 05/20/2015   HLD (hyperlipidemia) 05/20/2015   Avitaminosis D 05/20/2015   Chronic cough 01/20/2014   Adiposity 01/18/2012   Irregular bleeding 11/27/2006   Tobacco use 06/23/2005   Past Medical History:   Diagnosis Date   Anxiety    Arthritis    Depression    Hyperlipidemia    Lumbar stenosis    Sleep apnea    Waiting for Cpap    Family History  Problem Relation Age of Onset   Lung cancer Father    Healthy Brother    Aneurysm Maternal Grandmother    Breast cancer Maternal Grandmother        mat great gm   Lung cancer Maternal Grandfather    Coronary artery disease Paternal Grandmother    Breast cancer Paternal Grandmother    Diabetes Mother    Hypertension Mother     Past Surgical History:  Procedure Laterality Date   ANKLE SURGERY  01/17/2012   x3   APPLICATION OF WOUND VAC N/A 11/18/2018   Procedure: Application Of Wound Vac;  Surgeon: Eldred Manges, MD;  Location: [facility] OR;  Service: Orthopedics;  Laterality: N/A;   BACK SURGERY     BREAST BIOPSY Left 1990's   neg   CHOLECYSTECTOMY  2000   COLONOSCOPY WITH PROPOFOL N/A 01/31/2019   Procedure: COLONOSCOPY WITH PROPOFOL;  Surgeon: Wyline Mood, MD;  Location: [facility] ENDOSCOPY;  Service: Gastroenterology;  Laterality: N/A;   I & D EXTREMITY N/A 11/18/2018   Procedure: EVACUATION OF LUMBAR SEROMA/HEMATOMA;  Surgeon: Eldred Manges, MD;  Location: [facility] OR;  Service: Orthopedics;  Laterality: N/A;   LAMINECTOMY  1996   LUMBAR LAMINECTOMY N/A 10/30/2018   Procedure: left L3 & L4 hemilaminectomy, removal of herniated nucleus pulposus;  Surgeon: Eldred Manges, MD;  Location: [facility] OR;  Service: Orthopedics;  Laterality: N/A;   TOTAL KNEE ARTHROPLASTY Left 02/26/2019   Procedure: LEFT TOTAL KNEE ARTHROPLASTY-CEMENTED;  Surgeon: Eldred Manges, MD;  Location: [facility] OR;  Service: Orthopedics;  Laterality: Left;   WRIST SURGERY Right    Social History   Occupational History   Not on file  Tobacco Use   Smoking status: Every Day    Packs/day: 0.25    Years: 20.00    Additional pack years: 0.00    Total pack years: 5.00    Types: Cigarettes   Smokeless tobacco: Never  Vaping Use   Vaping Use: Never used  Substance and Sexual Activity    Alcohol use: Yes    Alcohol/week: 6.0 standard drinks of alcohol    Types: 6 Cans of beer per week    Comment: none last hrs   Drug use: Yes    Frequency: 7.0 times per week    Types: Marijuana   Sexual activity: Not on file
